# Patient Record
Sex: Female | Born: 1970 | Race: Black or African American | Hispanic: No | Marital: Single | Smoking: Current every day smoker
Health system: Southern US, Community
[De-identification: ages and names within clinical notes are randomized; demographics above are authoritative.]

## PROBLEM LIST (undated history)

## (undated) DIAGNOSIS — C539 Malignant neoplasm of cervix uteri, unspecified: Secondary | ICD-10-CM

## (undated) DIAGNOSIS — K649 Unspecified hemorrhoids: Secondary | ICD-10-CM

## (undated) DIAGNOSIS — F32A Depression, unspecified: Secondary | ICD-10-CM

## (undated) DIAGNOSIS — C801 Malignant (primary) neoplasm, unspecified: Secondary | ICD-10-CM

## (undated) DIAGNOSIS — T7840XA Allergy, unspecified, initial encounter: Secondary | ICD-10-CM

## (undated) DIAGNOSIS — F329 Major depressive disorder, single episode, unspecified: Secondary | ICD-10-CM

## (undated) HISTORY — DX: Malignant neoplasm of cervix uteri, unspecified: C53.9

## (undated) HISTORY — DX: Depression, unspecified: F32.A

## (undated) HISTORY — DX: Allergy, unspecified, initial encounter: T78.40XA

## (undated) HISTORY — PX: TUBAL LIGATION: SHX77

## (undated) HISTORY — DX: Major depressive disorder, single episode, unspecified: F32.9

## (undated) HISTORY — PX: BREAST SURGERY: SHX581

## (undated) HISTORY — PX: WISDOM TOOTH EXTRACTION: SHX21

## (undated) HISTORY — PX: CERVICAL CONE BIOPSY: SUR198

---

## 2000-05-21 ENCOUNTER — Emergency Department (HOSPITAL_COMMUNITY): Admission: EM | Admit: 2000-05-21 | Discharge: 2000-05-21 | Payer: Self-pay | Admitting: Emergency Medicine

## 2001-04-13 ENCOUNTER — Emergency Department (HOSPITAL_COMMUNITY): Admission: EM | Admit: 2001-04-13 | Discharge: 2001-04-13 | Payer: Self-pay

## 2002-05-04 ENCOUNTER — Emergency Department (HOSPITAL_COMMUNITY): Admission: EM | Admit: 2002-05-04 | Discharge: 2002-05-05 | Payer: Self-pay | Admitting: Emergency Medicine

## 2002-05-05 ENCOUNTER — Encounter: Payer: Self-pay | Admitting: Emergency Medicine

## 2003-06-22 ENCOUNTER — Emergency Department (HOSPITAL_COMMUNITY): Admission: EM | Admit: 2003-06-22 | Discharge: 2003-06-22 | Payer: Self-pay | Admitting: Emergency Medicine

## 2003-10-06 ENCOUNTER — Emergency Department (HOSPITAL_COMMUNITY): Admission: EM | Admit: 2003-10-06 | Discharge: 2003-10-07 | Payer: Self-pay | Admitting: Emergency Medicine

## 2004-01-22 ENCOUNTER — Emergency Department (HOSPITAL_COMMUNITY): Admission: EM | Admit: 2004-01-22 | Discharge: 2004-01-22 | Payer: Self-pay | Admitting: Emergency Medicine

## 2004-03-11 ENCOUNTER — Emergency Department (HOSPITAL_COMMUNITY): Admission: EM | Admit: 2004-03-11 | Discharge: 2004-03-11 | Payer: Self-pay | Admitting: Emergency Medicine

## 2004-08-20 ENCOUNTER — Emergency Department (HOSPITAL_COMMUNITY): Admission: EM | Admit: 2004-08-20 | Discharge: 2004-08-20 | Payer: Self-pay | Admitting: Emergency Medicine

## 2005-06-26 ENCOUNTER — Emergency Department (HOSPITAL_COMMUNITY): Admission: EM | Admit: 2005-06-26 | Discharge: 2005-06-26 | Payer: Self-pay | Admitting: Emergency Medicine

## 2005-10-25 ENCOUNTER — Emergency Department (HOSPITAL_COMMUNITY): Admission: EM | Admit: 2005-10-25 | Discharge: 2005-10-25 | Payer: Self-pay | Admitting: Emergency Medicine

## 2005-10-30 ENCOUNTER — Ambulatory Visit (HOSPITAL_COMMUNITY): Admission: RE | Admit: 2005-10-30 | Discharge: 2005-10-30 | Payer: Self-pay | Admitting: Preventative Medicine

## 2006-02-20 ENCOUNTER — Emergency Department (HOSPITAL_COMMUNITY): Admission: EM | Admit: 2006-02-20 | Discharge: 2006-02-20 | Payer: Self-pay | Admitting: Emergency Medicine

## 2006-06-29 ENCOUNTER — Emergency Department (HOSPITAL_COMMUNITY): Admission: EM | Admit: 2006-06-29 | Discharge: 2006-06-29 | Payer: Self-pay | Admitting: Emergency Medicine

## 2006-12-11 ENCOUNTER — Emergency Department (HOSPITAL_COMMUNITY): Admission: EM | Admit: 2006-12-11 | Discharge: 2006-12-11 | Payer: Self-pay | Admitting: Emergency Medicine

## 2006-12-28 ENCOUNTER — Emergency Department (HOSPITAL_COMMUNITY): Admission: EM | Admit: 2006-12-28 | Discharge: 2006-12-29 | Payer: Self-pay | Admitting: Emergency Medicine

## 2007-01-21 ENCOUNTER — Emergency Department (HOSPITAL_COMMUNITY): Admission: EM | Admit: 2007-01-21 | Discharge: 2007-01-21 | Payer: Self-pay | Admitting: Emergency Medicine

## 2007-03-16 ENCOUNTER — Inpatient Hospital Stay (HOSPITAL_COMMUNITY): Admission: EM | Admit: 2007-03-16 | Discharge: 2007-03-18 | Payer: Self-pay | Admitting: Emergency Medicine

## 2007-03-18 ENCOUNTER — Ambulatory Visit: Payer: Self-pay | Admitting: *Deleted

## 2007-03-18 ENCOUNTER — Inpatient Hospital Stay (HOSPITAL_COMMUNITY): Admission: AD | Admit: 2007-03-18 | Discharge: 2007-03-20 | Payer: Self-pay | Admitting: *Deleted

## 2007-05-22 ENCOUNTER — Other Ambulatory Visit: Admission: RE | Admit: 2007-05-22 | Discharge: 2007-05-22 | Payer: Self-pay | Admitting: Obstetrics and Gynecology

## 2008-09-09 ENCOUNTER — Emergency Department (HOSPITAL_COMMUNITY): Admission: EM | Admit: 2008-09-09 | Discharge: 2008-09-09 | Payer: Self-pay | Admitting: Emergency Medicine

## 2009-04-16 ENCOUNTER — Emergency Department (HOSPITAL_COMMUNITY): Admission: EM | Admit: 2009-04-16 | Discharge: 2009-04-16 | Payer: Self-pay | Admitting: Emergency Medicine

## 2010-08-07 ENCOUNTER — Emergency Department (HOSPITAL_COMMUNITY)
Admission: EM | Admit: 2010-08-07 | Discharge: 2010-08-07 | Payer: Self-pay | Source: Home / Self Care | Admitting: Emergency Medicine

## 2010-12-12 NOTE — H&P (Signed)
NAME:  Cromie, Yuliza         ACCOUNT NO.:  [id number]   MEDICAL RECORD NO.:  [id number]          PATIENT TYPE:  INP   LOCATION:  A216                          FACILITY:  [facility]   PHYSICIAN:  Mila Homer. Sudie Bailey, M.D.DATE OF BIRTH:  Dec 03, 1970   DATE OF ADMISSION:  03/16/2007  DATE OF DISCHARGE:  LH                              HISTORY & PHYSICAL   This 40 year old presented to the emergency room last night in torpor.   Currently she is treated at the [facility]  Department for bipolar disease.  Also, she has a history of asthma and  cigarette smoking.   The patient was asleep when I visited her today.  Family members there  included her mother, daughter, and her significant other.   CURRENT MEDICATIONS:  1. Clonazepam 0.5 mg as needed.  2. Trazodone 50 mg at bedtime.  3. Septra 960 mg b.i.d.  4. Seroquel 50 mg nightly.   According to the family, she had taken an overdose of Seroquel, as many  as 15 of these.  She has never taken an overdose before, again according  to them.   ADMISSION PHYSICAL EXAMINATION:  GENERAL:  Showed a sleeping young  woman.  VITAL SIGNS:  Temperature is 97 degrees, pulse 76, respiratory rate 20,  blood pressure 109/63.  I asked  her questions and talked to her, and  she was really unarousable.  O2 saturation was 100% on 2 liters.  LUNGS:  Clear throughout.  She is moving air well. Respiratory rate was  good at 20.  HEART:  Regular rhythm without murmur, rate of 80.  ADENOPATHY:  There was no axillary or supraclavicular adenopathy.  ABDOMEN:  Soft without hepatosplenomegaly or mass.  EXTREMITIES:  There was no edema in the ankles.   Blood tests she had included urine HCG which was negative.  Urine drug  screen was negative.  Alcohol level 160.  Acetaminophen level less than  10.  Hemoglobin 6.8, hematocrit 27.9,  MCV 66.5, platelet count 899,000,  white cell count 4900 with 2% metamyelocytes sites, 60 neutrophils, 28  lymphs.  BMET showed glucose 115, BUN 4, creatinine 0.68.  Salicylate  level less than 4.0, and acetaminophen level less than 10.  Reticulocyte  count was 1.6. The rest of the anemia workup is pending.   ASSESSMENT:  1. Drug overdose, probably with Seroquel  2. Bipolar disease.  3. Anemia, probably iron deficient anemia  4. Acute alcoholism.   I have discussed with family at length.  Will put in an IV of normal  saline and type and cross for 2 units of packed cells; give 1 unit  packed cells.  Once she has woken up and her hemoglobin is stable, we  will have her transferred to [facility] for further workup.  The ACT  team will be consulted.      Mila Homer. Sudie Bailey, M.D.  Electronically Signed     SDK/MEDQ  D:  03/16/2007  T:  03/16/2007  Job:  [id number]

## 2010-12-12 NOTE — Discharge Summary (Signed)
NAME:  Marissa Frank, Marissa Frank         ACCOUNT NO.:  [id number]   MEDICAL RECORD NO.:  [id number]          PATIENT TYPE:  INP   LOCATION:  A216                          FACILITY:  [facility]   PHYSICIAN:  Edward L. Juanetta Gosling, M.D.DATE OF BIRTH:  1971-05-31   DATE OF ADMISSION:  03/16/2007  DATE OF DISCHARGE:  08/19/2008LH                               DISCHARGE SUMMARY   FINAL DISCHARGE DIAGNOSES:  1. Suicide attempt.  2. Depression.  3. Asthma.  4. Bipolar disease.  5. Anemia.  6. Acute alcohol abuse.   HISTORY:  [name] is a 40 year old who came to the emergency room  because she was poorly responsive.  Her family said that she had taken  an overdose of Seroquel, as many as 15.  These were 50-mg Seroquel.  She  has been on Seroquel 50 mg at bedtime, Klonopin 0.5 as needed, trazodone  50 mg at bedtime as directed by the [facility].  Exam on admission showed that she was sleepy, poorly arousable.  O2 saturation was 100% on 2 L.  Her lungs were clear.  Respiratory rate  was about 20.  Heart regular.  Her abdomen was soft.  Her extremities  showed no edema.  Urine pregnancy test was negative.  Drug screen  negative.  Alcohol 160.  Acetaminophen level less than 10.  Hemoglobin  was 6.82. BMET was normal.   HOSPITAL COURSE:  She was felt to have a drug overdose with Seroquel,  bipolar disease, anemia, acute alcohol abuse.  She was given 2 units of  packed red blood cells, had no stools positive for blood.  She was  stabilized here and after her hemoglobin was better she was she had ACT  Team consultation and then was transferred to psychiatry service for  further workup.  Her anemia can be worked up as an outpatient.  She is  going to make an appointment to my office when she gets out of the  hospital and I will set her up for workup for her anemia.      Edward L. Juanetta Gosling, M.D.  Electronically Signed     ELH/MEDQ  D:  03/18/2007  T:  03/19/2007  Job:   [id number]

## 2010-12-12 NOTE — Group Therapy Note (Signed)
NAME:  Collignon, Cady         ACCOUNT NO.:  [id number]   MEDICAL RECORD NO.:  [id number]          PATIENT TYPE:  INP   LOCATION:  A216                          FACILITY:  [facility]   PHYSICIAN:  Edward L. Juanetta Gosling, M.D.DATE OF BIRTH:  05-Dec-1970   DATE OF PROCEDURE:  DATE OF DISCHARGE:                                 PROGRESS NOTE   Ms. Blankenburg better.  She says that she feels okay and she wants to  go home.  I have explained to her that she is going to need some further  psychiatric evaluation and treatment before she goes home since she did  have a suicide attempt.   EXAM:  She is awake and alert.  Temperature is 97.1, pulse 62, respirations 20, blood pressure 105/67,  O2 sat 97% on room air.   Her hemoglobin level is greater than 10.  I think she has iron  deficiency anemia.  She has not had any stools yet but I do not really  have any evidence of any sort of active bleeding now and this can be  workup as an outpatient.  She is currently cleared for transfer to  psychiatric facility, but I have explained to her that she is going to  have to come back to my office and get set up for a workup of her iron  deficiency anemia.  She could have menstrual blood loss or GI blood  loss.  We do not have a definite source.      Edward L. Juanetta Gosling, M.D.  Electronically Signed     ELH/MEDQ  D:  03/18/2007  T:  03/18/2007  Job:  [id number]

## 2010-12-12 NOTE — Group Therapy Note (Signed)
NAME:  Renner, Jacie         ACCOUNT NO.:  [id number]   MEDICAL RECORD NO.:  [id number]          PATIENT TYPE:  INP   LOCATION:  A216                          FACILITY:  [facility]   PHYSICIAN:  Edward L. Juanetta Gosling, M.D.DATE OF BIRTH:  Jul 04, 1971   DATE OF PROCEDURE:  03/17/2007  DATE OF DISCHARGE:                                 PROGRESS NOTE   Ms. Abrell was admitted with a drug overdose and was found to be  markedly anemic.  This morning she says her vision is a little blurred  and she feels like she is having some trouble with asthma.  She is  otherwise doing fairly well.  She is treated at the [facility] for bipolar disease.  Her anemia appears to be iron-  deficiency anemia, although final results are pending.   PHYSICAL EXAM:  Shows that she appears to be fairly comfortable.  CHEST:  Relatively clear despite her complaints of needing a nebulizer  treatment.  She has no other new complaints.  Her iron studies do show that her iron  is less than 10, so she is clearly iron deficient.  We will go ahead and  check stools for blood.  Her hemoglobin this morning is 7.8 so she needs  some more blood.  Will get the ACT team involved and depending on their  recommendation I may try to get her worked up for her anemia as an  outpatient.      Edward L. Juanetta Gosling, M.D.  Electronically Signed     ELH/MEDQ  D:  03/17/2007  T:  03/17/2007  Job:  [id number]

## 2010-12-12 NOTE — Discharge Summary (Signed)
NAME:  Yonker, VELEKA DJORDJEVIC NO.:  [id number]   MEDICAL RECORD NO.:  [id number]          PATIENT TYPE:  IPS   LOCATION:  0600                          FACILITY:  [facility]   PHYSICIAN:  Jasmine Pang, M.D. DATE OF BIRTH:  1970/08/06   DATE OF ADMISSION:  03/18/2007  DATE OF DISCHARGE:  03/20/2007                               DISCHARGE SUMMARY   IDENTIFYING INFORMATION:  This 40 year old African-American female who  was admitted on a voluntary basis on March 18, 2007.   HISTORY OF PRESENT ILLNESS:  The patient was transferred from the [facility].  She is medically stable.  She initially had an overdose  on Seroquel and drank beer.  She states this was a suicide gesture.  She was trying to get attention because her family was having conflict  and would not listen to her.  She told her daughter to call 9-1-1 as  soon as she took the overdose.  She stated she wanted her family to  listen to me.  On the day of the overdose, she also drank a 40-ounce  beer.   PAST PSYCHIATRIC HISTORY:  This is the first [facility] admission for this  patient.  She is seen at the [facility] in [place].  She  has no history of previous admissions.  She has been on Wellbutrin in  the past and Paxil.   ALCOHOL/DRUG HISTORY:  She smokes but denies drug abuse.  She does use  alcohol periodically.   MEDICAL HISTORY:  She has anemia.  There is no history of sepsis.   MEDICATIONS:  She is currently on Cymbalta for about the past year,  Seroquel and Klonopin.   ALLERGIES:  She is allergic TOPAMAX (causes ataxia).   PHYSICAL EXAMINATION:  Physical exam was done at the [facility] ED.  The  patient was found to be healthy with no acute physical or medical  problems.   DIAGNOSTIC STUDIES:  Glucose was 115.  Other basic metabolic panel was  normal.  UDS was negative.  Urine pregnancy test was negative.  Hemoglobin was 6.8 and hematocrit 22.5, the MCV was 66.5.  Differential  was  within normal limits.  The patient sees Dr. Kari Baars for her  anemia in [place].  Alcohol level was 160.  Acetaminophen level less  than 10.  The rest of the labs were done at the [facility] ED prior to  admission and evaluated by the ED physician.   HOSPITAL COURSE:  Upon admission, the patient was started on her DuoNeb  q.6h. p.r.n. shortness of breath and albuterol inhaler 2 puffs q.4-6h.  p.r.n.  She was also started on Ambien 5 mg p.o. q.h.s. p.r.n. insomnia,  may repeat x1 if needed.  She was started on trazodone 50 mg p.o. q.h.s.  and Septra DS 1 p.o. b.i.d. x3 days.  On March 19, 2007, the patient  was restarted on Cymbalta but it was increased to 90 mg p.o. q.d. from  60 mg p.o. q.d.  The patient tolerated her medications well with no  significant side effects.  The patient was friendly and cooperative.  She  stated she had just wanted attention because of the family  conflict, especially with her boyfriend.  She denied suicidal ideation.  She states she has support from her family.  She was able to participate  appropriately in unit therapeutic groups and activities.  She found that  her daughter was going to be moving out of the home which she states is  one of the main stressors for her.  Her mood and affect improved as  hospitalization progressed.  On March 20, 2007, mental status had  improved markedly from admission status.  She was friendly and  cooperative with good eye contact.  Speech was normal rate and flow.  Psychomotor activity was within normal limits.  Mood euthymic.  Affect  wide range.  There was no suicidal or homicidal ideation.  No thoughts  of self-injurious behavior.  No auditory or visual hallucinations.  No  paranoia or delusions.  Thoughts were logical and goal-directed.  Thought content no predominant theme.  Cognitive was back to baseline  and it was felt the patient was safe to be discharged today.  She will  be taken home by either her  boyfriend or her cousin.   DISCHARGE DIAGNOSES:  AXIS I:  Major depression, recurrent, severe  without psychosis.  Rule out alcohol use.  AXIS II:  None.  AXIS III:  Anemia, being monitored by her primary care physician.  AXIS IV:  Moderate (problems with primary support group, problems  related to social environment, burden of psychiatric illness, burden of  medical problems).  AXIS V:  GAF on discharge 50; GAF on admission 40; GAF highest past year  65.   ACTIVITY/DIET:  There were no specific activity level or dietary  restrictions.   POST-HOSPITAL CARE PLANS:  The patient will follow up with Dr. Juanetta Gosling  for anemia.  Therapist and psychiatrist will be arranged for her by our  casemanager.  This is in process now.      Jasmine Pang, M.D.  Electronically Signed     BHS/MEDQ  D:  03/20/2007  T:  03/20/2007  Job:  [id number]

## 2011-03-30 ENCOUNTER — Encounter: Payer: Self-pay | Admitting: *Deleted

## 2011-03-30 ENCOUNTER — Emergency Department (HOSPITAL_COMMUNITY)
Admission: EM | Admit: 2011-03-30 | Discharge: 2011-03-30 | Disposition: A | Discharge status: Home / Self Care | Payer: Self-pay | Attending: Emergency Medicine | Admitting: Emergency Medicine

## 2011-03-30 DIAGNOSIS — J45909 Unspecified asthma, uncomplicated: Secondary | ICD-10-CM | POA: Insufficient documentation

## 2011-03-30 DIAGNOSIS — H539 Unspecified visual disturbance: Secondary | ICD-10-CM | POA: Insufficient documentation

## 2011-03-30 DIAGNOSIS — H5710 Ocular pain, unspecified eye: Secondary | ICD-10-CM | POA: Insufficient documentation

## 2011-03-30 DIAGNOSIS — H53149 Visual discomfort, unspecified: Secondary | ICD-10-CM | POA: Insufficient documentation

## 2011-03-30 DIAGNOSIS — H5789 Other specified disorders of eye and adnexa: Secondary | ICD-10-CM | POA: Insufficient documentation

## 2011-03-30 DIAGNOSIS — F17200 Nicotine dependence, unspecified, uncomplicated: Secondary | ICD-10-CM | POA: Insufficient documentation

## 2011-03-30 DIAGNOSIS — Z859 Personal history of malignant neoplasm, unspecified: Secondary | ICD-10-CM | POA: Insufficient documentation

## 2011-03-30 HISTORY — DX: Malignant (primary) neoplasm, unspecified: C80.1

## 2011-03-30 MED ORDER — KETOROLAC TROMETHAMINE 0.5 % OP SOLN: 1.0000 [drp] | Freq: Four times a day (QID) | OPHTHALMIC | Status: DC

## 2011-03-30 MED ORDER — TETRACAINE HCL 0.5 % OP SOLN
2.0000 [drp] | Freq: Once | OPHTHALMIC | Status: AC
  Administered 2011-03-30: 2 [drp] via OPHTHALMIC
  Filled 2011-03-30: qty 2

## 2011-03-30 MED ORDER — HYDROCODONE-ACETAMINOPHEN 5-325 MG PO TABS: ORAL_TABLET | ORAL | Status: DC

## 2011-03-30 MED ORDER — KETOROLAC TROMETHAMINE 0.5 % OP SOLN: 1.0000 [drp] | Freq: Once | OPHTHALMIC | Status: DC

## 2011-03-30 MED ORDER — FLUORESCEIN SODIUM 1 MG OP STRP: 1.0000 | ORAL_STRIP | Freq: Once | OPHTHALMIC | Status: DC

## 2011-03-30 MED ORDER — HYDROCODONE-ACETAMINOPHEN 5-325 MG PO TABS: 1.0000 | ORAL_TABLET | Freq: Once | ORAL | Status: DC

## 2011-03-30 NOTE — ED Provider Notes (Signed)
History     CSN: [id number] Arrival date & time: 03/30/2011  8:46 PM  Chief Complaint  Patient presents with  . Eye Pain   HPI Comments: Pt' husband drives long-distance.  She has been with him for the past week.  She states it feels like there is something in her eye but doesn't recall a particular time when it got there.  Using visine with no relief.  Patient is a 40 y.o. female presenting with eye pain. The history is provided by the patient. No language interpreter was used.  Eye Pain This is a new problem. The current episode started today (6 days ago). The problem occurs constantly. The problem has been unchanged. Exacerbated by: bright lite and eye movement. She has tried nothing for the symptoms. The treatment provided no relief.    Past Medical History  Diagnosis Date  . Asthma   . Cancer     Past Surgical History  Procedure Date  . Breast surgery     History reviewed. No pertinent family history.  History  Substance Use Topics  . Smoking status: Current Everyday Smoker -- 0.5 packs/day  . Smokeless tobacco: Not on file  . Alcohol Use: Yes    OB History    Grav Para Term Preterm Abortions TAB SAB Ect Mult Living                  Review of Systems  Eyes: Positive for photophobia, pain, redness and visual disturbance.  All other systems reviewed and are negative.    Physical Exam  BP 113/66  Pulse 59  Temp(Src) 98.1 F (36.7 C) (Oral)  Resp 20  Ht 5\' 2"  (1.575 m)  Wt 100 lb (45.36 kg)  BMI 18.29 kg/m2  SpO2 100%  Physical Exam  Nursing note and vitals reviewed. Constitutional: She is oriented to person, place, and time. Vital signs are normal. She appears well-developed and well-nourished. No distress.  HENT:  Head: Normocephalic and atraumatic.  Right Ear: External ear normal.  Left Ear: External ear normal.  Nose: Nose normal.  Mouth/Throat: No oropharyngeal exudate.  Eyes: Conjunctivae and EOM are normal. Pupils are equal, round, and  reactive to light. Right eye exhibits no discharge. Left eye exhibits no discharge. No scleral icterus.         Upper lid inverted.  No FB visualized.  anesth with tetracaine and stained with fluorosceine.  No sign of corneal abrasion.  Will irrigate with NaCl via morgan lens.  Neck: Normal range of motion. Neck supple. No JVD present. No tracheal deviation present. No thyromegaly present.  Cardiovascular: Normal rate, regular rhythm, normal heart sounds, intact distal pulses and normal pulses.  Exam reveals no gallop and no friction rub.   No murmur heard. Pulmonary/Chest: Effort normal and breath sounds normal. No stridor. No respiratory distress. She has no wheezes. She has no rales. She exhibits no tenderness.  Abdominal: Soft. Normal appearance and bowel sounds are normal. She exhibits no distension and no mass. There is no tenderness. There is no rebound and no guarding.  Musculoskeletal: Normal range of motion. She exhibits no edema and no tenderness.  Lymphadenopathy:    She has no cervical adenopathy.  Neurological: She is alert and oriented to person, place, and time. She has normal reflexes. No cranial nerve deficit. Coordination normal. GCS eye subscore is 4. GCS verbal subscore is 5. GCS motor subscore is 6.  Skin: Skin is warm and dry. No rash noted. She is not diaphoretic.  Psychiatric: She has a normal mood and affect. Her speech is normal and behavior is normal. Judgment and thought content normal. Cognition and memory are normal.    ED Course  Procedures  MDM       Worthy Rancher, PA 03/30/11 2247

## 2011-03-30 NOTE — ED Notes (Signed)
Pain in right eye, sclera is red and painful x 6 days

## 2011-03-31 NOTE — ED Provider Notes (Signed)
Medical screening examination/treatment/procedure(s) were performed by non-physician practitioner and as supervising physician I was immediately available for consultation/collaboration.  Glynn Octave, MD 03/31/11 [phone]

## 2011-05-11 LAB — CROSSMATCH

## 2011-05-11 LAB — CBC
HCT: 25.1 — ABNORMAL LOW
Hemoglobin: 7.8 — CL
MCHC: 29.7 — ABNORMAL LOW
MCV: 70.8 — ABNORMAL LOW
Platelets: 509 — ABNORMAL HIGH
Platelets: 697 — ABNORMAL HIGH
RBC: 3.44 — ABNORMAL LOW
RDW: 25 — ABNORMAL HIGH
WBC: 11.7 — ABNORMAL HIGH
WBC: 9

## 2011-05-11 LAB — DIFFERENTIAL
Basophils Relative: 0
Eosinophils Relative: 2
Lymphocytes Relative: 13
Monocytes Relative: 13 — ABNORMAL HIGH
Myelocytes: 0
Neutro Abs: 2.9
Neutrophils Relative %: 60
Promyelocytes Absolute: 0
nRBC: 0

## 2011-05-11 LAB — HEPATIC FUNCTION PANEL
Alkaline Phosphatase: 71
Bilirubin, Direct: 0.1
Indirect Bilirubin: 0.6
Total Bilirubin: 0.7

## 2011-05-11 LAB — BASIC METABOLIC PANEL
CO2: 24
Calcium: 9.2
Creatinine, Ser: 0.68
GFR calc Af Amer: >60
Glucose, Bld: 115 — ABNORMAL HIGH

## 2011-05-11 LAB — PREGNANCY, URINE: Preg Test, Ur: NEGATIVE

## 2011-05-11 LAB — IRON AND TIBC
Iron: <10 — ABNORMAL LOW
UIBC: 415

## 2011-05-11 LAB — FERRITIN: Ferritin: 2 — ABNORMAL LOW (ref 10–291)

## 2011-05-11 LAB — RAPID URINE DRUG SCREEN, HOSP PERFORMED
Benzodiazepines: NOT DETECTED
Cocaine: NOT DETECTED
Tetrahydrocannabinol: NOT DETECTED

## 2011-05-11 LAB — HEMOGLOBIN AND HEMATOCRIT, BLOOD: Hemoglobin: 10.1 — ABNORMAL LOW

## 2011-05-11 LAB — RETICULOCYTES: Retic Count, Absolute: 55.2

## 2012-05-05 ENCOUNTER — Emergency Department (HOSPITAL_COMMUNITY): Payer: Self-pay

## 2012-05-05 ENCOUNTER — Encounter (HOSPITAL_COMMUNITY): Payer: Self-pay | Admitting: *Deleted

## 2012-05-05 ENCOUNTER — Emergency Department (HOSPITAL_COMMUNITY)
Admission: EM | Admit: 2012-05-05 | Discharge: 2012-05-06 | Disposition: A | Discharge status: Home / Self Care | Payer: Self-pay | Attending: Emergency Medicine | Admitting: Emergency Medicine

## 2012-05-05 DIAGNOSIS — M25519 Pain in unspecified shoulder: Secondary | ICD-10-CM | POA: Insufficient documentation

## 2012-05-05 DIAGNOSIS — F17200 Nicotine dependence, unspecified, uncomplicated: Secondary | ICD-10-CM | POA: Insufficient documentation

## 2012-05-05 DIAGNOSIS — M79603 Pain in arm, unspecified: Secondary | ICD-10-CM

## 2012-05-05 DIAGNOSIS — M79609 Pain in unspecified limb: Secondary | ICD-10-CM | POA: Insufficient documentation

## 2012-05-05 MED ORDER — HYDROCODONE-ACETAMINOPHEN 5-325 MG PO TABS
2.0000 | ORAL_TABLET | Freq: Once | ORAL | Status: AC
  Administered 2012-05-06: 2 via ORAL
  Filled 2012-05-05: qty 2

## 2012-05-05 MED ORDER — KETOROLAC TROMETHAMINE 60 MG/2ML IM SOLN
60.0000 mg | Freq: Once | INTRAMUSCULAR | Status: AC
  Administered 2012-05-05: 60 mg via INTRAMUSCULAR
  Filled 2012-05-05: qty 2

## 2012-05-05 NOTE — ED Notes (Signed)
Patient states that she has been having pain in her left upper arm, left neck and left shoulder.  States that the pain radiates up and down her left upper arm, also states that this pain has been going on for approx 1 month.  States that she has been taking aspirin at home without relief.

## 2012-05-05 NOTE — ED Provider Notes (Signed)
History   Scribed for [employer]. Colon Branch, MD, the patient was seen in room APA04/APA04 . This chart was scribed by Lewanda Rife.   CSN: [id number]  Arrival date & time 05/05/12  2241   First MD Initiated Contact with Patient 05/05/12 2309      Chief Complaint  Patient presents with  . Back Pain    (Consider location/radiation/quality/duration/timing/severity/associated sxs/prior treatment) HPI Marissa Frank is a 41 y.o. female who presents to the Emergency Department complaining of constant severe left shoulder pain with radiation to left neck, and left arm for the past month. Pt reports pain increased pain with movement. Pt denies recent injury. Pt reports taking aspirin and ibuprofen for pain with mild relief.  Past Medical History  Diagnosis Date  . Asthma   . Cancer     Past Surgical History  Procedure Date  . Breast surgery   . Tubal ligation     History reviewed. No pertinent family history.  History  Substance Use Topics  . Smoking status: Current Every Day Smoker -- 0.5 packs/day  . Smokeless tobacco: Not on file  . Alcohol Use: Yes    OB History    Grav Para Term Preterm Abortions TAB SAB Ect Mult Living                  Review of Systems  Constitutional: Negative.   HENT: Negative.   Respiratory: Negative.   Cardiovascular: Negative.   Gastrointestinal: Negative.   Musculoskeletal: Positive for back pain.       Left should pain  Skin: Negative.   Neurological: Negative.   Hematological: Negative.   Psychiatric/Behavioral: Negative.   All other systems reviewed and are negative.    Allergies  Almond oil; Coconut flavor; and Macadamia nut oil  Home Medications   Current Outpatient Rx  Name Route Sig Dispense Refill  . REFRESH P.M. OP OINT Right Eye Place 1 application into the right eye as needed.      Marland Kitchen FERROUS SULFATE 325 (65 FE) MG PO TABS Oral Take 325 mg by mouth daily.      Marland Kitchen HYDROCODONE-ACETAMINOPHEN 5-325 MG PO TABS   One po Q 4-6 hrs prn pain 20 tablet 0  . IBUPROFEN 200 MG PO TABS Oral Take 400 mg by mouth once as needed. For pan     . OXYMETAZOLINE HCL 0.05 % NA SOLN Nasal Place 2 sprays into the nose 2 (two) times daily.      Jeananne Rama SULFATE 0.05-0.25 % OP SOLN  2 drops 3 (three) times daily as needed. FOR PAIN       BP 120/72  Pulse 67  Temp 98 F (36.7 C) (Oral)  Resp 20  Ht 5\' 2"  (1.575 m)  Wt 87 lb (39.463 kg)  BMI 15.91 kg/m2  SpO2 100%  LMP 04/19/2012  Physical Exam  Nursing note and vitals reviewed. Constitutional: She is oriented to person, place, and time. She appears well-developed and well-nourished.       Smelled strongly of ETOH   HENT:  Head: Normocephalic and atraumatic.  Eyes: EOM are normal. Pupils are equal, round, and reactive to light.  Neck: Normal range of motion. Neck supple.  Cardiovascular: Normal rate, normal heart sounds and intact distal pulses.   Pulmonary/Chest: Effort normal and breath sounds normal.  Abdominal: Bowel sounds are normal. She exhibits no distension. There is no tenderness.  Musculoskeletal: Normal range of motion. She exhibits tenderness (left sided neck pain on exam with passive  range of motion of left arm ). She exhibits no edema.       No obvious deformity on left arm No focal area of tenderness other than the left sternocleidomastoid Pain was reproducible with passive motion of left arm on exam  Neurological: She is alert and oriented to person, place, and time. She has normal strength. No cranial nerve deficit or sensory deficit.  Skin: Skin is warm and dry. No rash noted.  Psychiatric: She has a normal mood and affect.    ED Course  Procedures (including critical care time)  Dg Shoulder Left  05/06/2012  *RADIOLOGY REPORT*  Clinical Data: 41 year old female with pain.  LEFT SHOULDER - 2+ VIEW  Comparison: None.  Findings: No glenohumeral joint dislocation.   Bone mineralization is within normal limits.  Proximal left  humerus, left clavicle, and left scapula intact.  Visualized left ribs and lung parenchyma within normal limits.  IMPRESSION: No acute osseous abnormality identified about the left shoulder.   Original Report Authenticated By: Harley Hallmark, M.D.     MDM  Patient with left shoulder and arm pain x 1 month with no known injury. Xrays are normal. Given  An antiinflammatory with improvement. Reviewed films with the patient. Referral to ortho. Pt feels improved after observation and/or treatment in ED.Pt stable in ED with no significant deterioration in condition.The patient appears reasonably screened and/or stabilized for discharge and I doubt any other medical condition or other [facility] requiring further screening, evaluation, or treatment in the ED at this time prior to discharge.  I personally performed the services described in this documentation, which was scribed in my presence. The recorded information has been reviewed and considered.   MDM Reviewed: nursing note and vitals Interpretation: x-ray           Nicoletta Dress. Colon Branch, MD 05/06/12 0100

## 2012-05-05 NOTE — ED Notes (Signed)
Pain lt arm, neck and lt shoulder, x 1 month.  No injury

## 2012-05-06 MED ORDER — HYDROCODONE-ACETAMINOPHEN 5-325 MG PO TABS: 1.0000 | ORAL_TABLET | ORAL | Status: AC | PRN

## 2014-06-01 ENCOUNTER — Other Ambulatory Visit (HOSPITAL_COMMUNITY): Payer: Self-pay | Admitting: *Deleted

## 2014-06-01 DIAGNOSIS — Z1231 Encounter for screening mammogram for malignant neoplasm of breast: Secondary | ICD-10-CM

## 2014-06-03 ENCOUNTER — Ambulatory Visit (HOSPITAL_COMMUNITY)
Admission: RE | Admit: 2014-06-03 | Discharge: 2014-06-03 | Disposition: A | Discharge status: Home / Self Care | Payer: PRIVATE HEALTH INSURANCE | Source: Ambulatory Visit | Attending: *Deleted | Admitting: *Deleted

## 2014-06-03 DIAGNOSIS — Z1231 Encounter for screening mammogram for malignant neoplasm of breast: Secondary | ICD-10-CM | POA: Insufficient documentation

## 2015-01-18 ENCOUNTER — Encounter (HOSPITAL_COMMUNITY): Payer: Self-pay

## 2015-01-18 ENCOUNTER — Emergency Department (HOSPITAL_COMMUNITY)
Admission: EM | Admit: 2015-01-18 | Discharge: 2015-01-18 | Disposition: A | Discharge status: Home / Self Care | Payer: Self-pay | Attending: Emergency Medicine | Admitting: Emergency Medicine

## 2015-01-18 DIAGNOSIS — K644 Residual hemorrhoidal skin tags: Secondary | ICD-10-CM | POA: Insufficient documentation

## 2015-01-18 DIAGNOSIS — Z859 Personal history of malignant neoplasm, unspecified: Secondary | ICD-10-CM | POA: Insufficient documentation

## 2015-01-18 DIAGNOSIS — Z720 Tobacco use: Secondary | ICD-10-CM | POA: Insufficient documentation

## 2015-01-18 DIAGNOSIS — J45909 Unspecified asthma, uncomplicated: Secondary | ICD-10-CM | POA: Insufficient documentation

## 2015-01-18 DIAGNOSIS — Z7982 Long term (current) use of aspirin: Secondary | ICD-10-CM | POA: Insufficient documentation

## 2015-01-18 DIAGNOSIS — Z79899 Other long term (current) drug therapy: Secondary | ICD-10-CM | POA: Insufficient documentation

## 2015-01-18 DIAGNOSIS — K602 Anal fissure, unspecified: Secondary | ICD-10-CM | POA: Insufficient documentation

## 2015-01-18 NOTE — ED Provider Notes (Signed)
CSN: [id number]     Arrival date & time 01/18/15  1696 History   First MD Initiated Contact with Patient 01/18/15 1026     Chief Complaint  Patient presents with  . Rectal Bleeding     HPI Pt was seen at 1100. Per pt, c/o sudden onset and resolution of one episode of "rectal bleeding" that occurred this morning PTA. Pt states she has been constipated and had a large, hard BM this morning. Pt states the rectal bleeding and pain began after she passed the stool. Denies bloody or black stool, no abd pain, no fevers.    Past Medical History  Diagnosis Date  . Asthma   . Cancer    Past Surgical History  Procedure Laterality Date  . Breast surgery    . Tubal ligation      History  Substance Use Topics  . Smoking status: Current Every Day Smoker -- 0.50 packs/day  . Smokeless tobacco: Not on file  . Alcohol Use: Yes     Comment: occ    Review of Systems ROS: Statement: All systems negative except as marked or noted in the HPI; Constitutional: Negative for fever and chills. ; ; Eyes: Negative for eye pain, redness and discharge. ; ; ENMT: Negative for ear pain, hoarseness, nasal congestion, sinus pressure and sore throat. ; ; Cardiovascular: Negative for chest pain, palpitations, diaphoresis, dyspnea and peripheral edema. ; ; Respiratory: Negative for cough, wheezing and stridor. ; ; Gastrointestinal: Negative for nausea, vomiting, diarrhea, abdominal pain, blood in stool, hematemesis, jaundice and +constipation, rectal pain, rectal bleeding. . ; ; Genitourinary: Negative for dysuria, flank pain and hematuria. ; ; Musculoskeletal: Negative for back pain and neck pain. Negative for swelling and trauma.; ; Skin: Negative for pruritus, rash, abrasions, blisters, bruising and skin lesion.; ; Neuro: Negative for headache, lightheadedness and neck stiffness. Negative for weakness, altered level of consciousness , altered mental status, extremity weakness, paresthesias, involuntary movement, seizure  and syncope.      Allergies  Almond oil; Coconut flavor; and Macadamia nut oil  Home Medications   Prior to Admission medications   Medication Sig Start Date End Date Taking? Authorizing Provider  aspirin 325 MG tablet Take 325 mg by mouth daily.   Yes Historical Provider, MD  Cyanocobalamin (VITAMIN B-12 PO) Take 1 tablet by mouth daily.   Yes Historical Provider, MD  Ephedrine-Guaifenesin (PRIMATENE ASTHMA PO) Take 1 tablet by mouth daily as needed (shortness of breath).   Yes Historical Provider, MD  ferrous sulfate 325 (65 FE) MG tablet Take 325 mg by mouth daily.     Yes Historical Provider, MD  ibuprofen (ADVIL,MOTRIN) 200 MG tablet Take 400 mg by mouth once as needed. For pan    Yes Historical Provider, MD  oxymetazoline (AFRIN) 0.05 % nasal spray Place 2 sprays into the nose 2 (two) times daily.     Yes Historical Provider, MD  tetrahydrozoline-zinc (VISINE-AC) 0.05-0.25 % ophthalmic solution 2 drops 3 (three) times daily as needed. FOR PAIN    Yes Historical Provider, MD  HYDROcodone-acetaminophen [facility]) 5-325 MG per tablet One po Q 4-6 hrs prn pain Patient not taking: Reported on 01/18/2015 03/30/11   Duaine Dredge, PA-C   BP 121/85 mmHg  Pulse 70  Temp(Src) 98.4 F (36.9 C) (Oral)  Resp 16  Ht 5\' 1"  (1.549 m)  Wt 91 lb (41.277 kg)  BMI 17.20 kg/m2  SpO2 100%  LMP 04/03/2014 Physical Exam  1105: Physical examination:  Nursing notes reviewed;  Vital signs and O2 SAT reviewed;  Constitutional: Well developed, Well nourished, Well hydrated, In no acute distress; Head:  Normocephalic, atraumatic; Eyes: EOMI, PERRL, No scleral icterus; ENMT: Mouth and pharynx normal, Mucous membranes moist; Neck: Supple, Full range of motion, No lymphadenopathy; Cardiovascular: Regular rate and rhythm, No murmur, rub, or gallop; Respiratory: Breath sounds clear & equal bilaterally, No rales, rhonchi, wheezes.  Speaking full sentences with ease, Normal respiratory effort/excursion; Chest:  Nontender, Movement normal; Abdomen: Soft, Nontender, Nondistended, Normal bowel sounds. Rectal exam performed w/permission of pt and ED RN chaperone present.  Anal tone normal. +tender anal fissure without active bleeding. +external hemorrhoid without thrombosis or bleeding..; Genitourinary: No CVA tenderness; Extremities: Pulses normal, No tenderness, No edema, No calf edema or asymmetry.; Neuro: AA&Ox3, Major CN grossly intact.  Speech clear. No gross focal motor or sensory deficits in extremities.; Skin: Color normal, Warm, Dry.   ED Course  Procedures     EKG Interpretation None      MDM  MDM Reviewed: previous chart, nursing note and vitals      1135:  +rectal fissure and external hemorrhoid on exam. No active rectal bleeding. Dx d/w pt.  Questions answered.  Verb understanding, agreeable to d/c home with outpt f/u.   Francine Graven, DO 01/21/15 [phone]

## 2015-01-18 NOTE — Discharge Instructions (Signed)
°Emergency Department Resource Guide °1) Find a Doctor and Pay Out of Pocket °Although you won't have to find out who is covered by your insurance plan, it is a good idea to ask around and get recommendations. You will then need to call the office and see if the doctor you have chosen will accept you as a new patient and what types of options they offer for patients who are self-pay. Some doctors offer discounts or will set up payment plans for their patients who do not have insurance, but you will need to ask so you aren't surprised when you get to your appointment. ° °2) Contact Your Local Health Department °Not all health departments have doctors that can see patients for sick visits, but many do, so it is worth a call to see if yours does. If you don't know where your local health department is, you can check in your phone book. The CDC also has a tool to help you locate your state's health department, and many state websites also have listings of all of their local health departments. ° °3) Find a Walk-in Clinic °If your illness is not likely to be very severe or complicated, you may want to try a walk in clinic. These are popping up all over the country in pharmacies, drugstores, and shopping centers. They're usually staffed by nurse practitioners or physician assistants that have been trained to treat common illnesses and complaints. They're usually fairly quick and inexpensive. However, if you have serious medical issues or chronic medical problems, these are probably not your best option. ° °No Primary Care Doctor: °- Call [facility] at  832-8000 - they can help you locate a primary care doctor that  accepts your insurance, provides certain services, etc. °- Physician Referral Service- 1-800-533-3463 ° °Chronic Pain Problems: °Organization         Address  Phone   Notes  °[facility]  (336) 297-2271 Patients need to be referred by their primary care doctor.  ° °Medication  Assistance: °Organization         Address  Phone   Notes  °[place] [facility] [address] °[place], [place] [address] (336) 641-8030 --Must be a resident of [place] °-- Must have NO insurance coverage whatsoever (no Medicaid/ Medicare, etc.) °-- The pt. MUST have a primary care doctor that directs their care regularly and follows them in the community °  °[facility]  (866) 331-1348   °[employer]  (888) 892-1162   ° °Agencies that provide inexpensive medical care: °Organization         Address  Phone   Notes  °[facility]  (336) 832-8035   °[facility]    (336) 832-7272   °[facility] [address] °[place],  [address] (336) 832-4777   °[facility] [address], °[place] (336) 271-4999   °[facility]    (336) 373-0678   °[facility]    (336) 272-1050   °[facility] ° [address], [place] Phone:  (336) 832-4444, Fax:  (336) 832-4440 Hours of Operation:  9 am - 6 pm, M-F.  Also accepts Medicaid/Medicare and self-pay.  °[facility] ° [address], [place] Phone: (336) 832-3150, Fax: (336) [phone]. Hours of Operation:  8:30 am - 5:30 pm, M-F.  Also accepts Medicaid and self-pay.  °[facility] [place] [address], [place] Phone: (336) 878-6027   °[facility] [address], [place], [place] (336)723-1848, Ext. 123 Mondays & Thursdays: 7-9 AM.  First 15 patients are seen on a first come, first serve basis. °  ° °Medicaid-accepting [place] Providers: ° °Organization         Address  Phone   Notes  °[facility] [address], [place] (336) 641-2100 Also accepts self-pay patients.  °[facility] [address], [place] ° (336) 856-9996   °[facility] [address], [place]  (336) 288-8857   °[facility] [address] [place] [address], [place] (336) 299-7000   °Veita Bland [address], [place]  ° (336) 373-1557 Only accepts [place] Access Medicaid patients after they have their name applied to their card.  ° °Self-Pay (no insurance) in [place]: ° °Organization         Address  Phone   Notes  °Sickle Cell Patients, [facility] [address], [place] (336) 832-1970   °[facility] [address], [place] (336) 832-4400   °[facility] [place] ° [address], [place] (336) 992-4800   °[facility]/Dr. Osei-Bonsu ° [address] [place] [address], [place] or [address], [place] (336) [phone] Phone number for both [place] and [place] locations is the same.  °[facility] [address], [place] (336) 299-0000   °[facility] [address] [place] [address], [place] or [address] (336) 852-7530 °(336) 878-2260   °[facility] [address], [place] (336) 350-1642, phone; (336) 294-5005, fax Sees patients 1st and 3rd Saturday of every month.  Must not qualify for public or private insurance (i.e. Medicaid, Medicare, [place] Health Choice, Veterans' Benefits) • Household income should be no more than 200% of the poverty level •The clinic cannot treat you if you are pregnant or think you are pregnant • Sexually transmitted diseases are not treated at the clinic.  ° ° °Dental Care: °Organization         Address  Phone  Notes  °[place] [facility] [address], [place] (336) 641-6152 Accepts children up to age 21 who are enrolled in Medicaid or [place] Health Choice; pregnant women with a Medicaid card; and children who have applied for Medicaid or [place] Health Choice, but were declined, whose parents can pay a reduced fee at time of service.  °[place]  [facility] [place]  [address], [place] (336) 641-7733 Accepts children up to age 21 who are enrolled in Medicaid or [place] Health Choice; pregnant women with a Medicaid card; and children who have applied for Medicaid or [place] Health Choice, but were declined, whose parents can pay a reduced fee at time of service.  °[facility] ° [address],  (336) 641-4533 Patients are seen by appointment only. Walk-ins are not accepted. [facility] will see patients 18 years of age and older. °Monday - Tuesday (8am-5pm) °Most Wednesdays (8:30-5pm) °$30 per visit, cash only  °[facility] ° [address], [place] (336) 641-4533 Patients are seen by appointment only. Walk-ins are not accepted. [facility] will see patients 18 years of age and older. °One   Wednesday Evening (Monthly: Volunteer Based).  $30 per visit, cash only  °[facility]  (919) 537-3737 for adults; Children under age 4, call Graduate Pediatric Dentistry at ([phone]) [phone]. Children aged 4-14, please call (919) [phone] to request a pediatric application. ° Dental services are provided in all areas of dental care including fillings, crowns and bridges, complete and partial dentures, implants, gum treatment, root canals, and extractions. Preventive care is also provided. Treatment is provided to both adults and children. °Patients are selected via a lottery and there is often a waiting list. °  °[facility] [address], °[place] ° (336) 763-8833 [email or web address] °  °[facility] [address], [place], [place] (336)723-1848, Ext. 123 Second and Fourth Thursday of each month, opens at 6:30 AM; Clinic ends at 9 AM.  Patients are seen on a first-come first-served basis, and a limited number are seen during each clinic.  ° °[facility] ° [address], [place], [place] (336) [phone]    Eligibility Requirements °You must have lived in [place], [place], or [place] counties for at least the last three months. °  You cannot be eligible for state or federal sponsored healthcare insurance, including Veterans Administration, Medicaid, or Medicare. °  You generally cannot be eligible for healthcare insurance through your employer.  °  How to apply: °Eligibility screenings are held every Tuesday and Wednesday afternoon from 1:00 pm until 4:00 pm. You do not need an appointment for the interview!  °[facility] [address], [place], [place] 336-631-2330   °[facility]  336-342-8273   °[facility]  336-703-3100   °[facility]  336-570-6415   ° °Behavioral Health Resources in the Community: °Intensive Outpatient Programs °Organization         Address  Phone  Notes  °[facility] [address], [place], [place] 336-878-6098   °[facility] [address], [place], [place] 336-832-9800   °[facility] [address], [place], [place] ° 336-882-2125   °[facility] [address],  °[place], [place] 1-800-853-5163 or 336-641-4981   °Substance Abuse Resources °Organization         Address  Phone  Notes  °[facility]  336-882-2125   °[facility]  336-784-9470   °[facility]  336-285-9073   °[facility]  336-845-3988   °Residential & Outpatient Substance Abuse Program  1-800-659-3381   °Psychological Services °Organization         Address  Phone  Notes  °[facility]  336- 832-9600   °[facility]  336- 378-7881   °[facility] [address], [place] 1-800-853-5163 or 336-641-4981   ° °Mobile Crisis Teams °Organization         Address  Phone  Notes  °[facility], Mobile Crisis Care Unit  1-877-626-1772   °Assertive °[facility] ° [address]  [place], [place] 336-834-9664   °Sharon DeEsch [address] °[place] 336-554-5454   ° °Self-Help/Support Groups °Organization         Address  Phone             Notes  °[facility] - variety of support groups  336- 373-1402 Call for more information  °Narcotics Anonymous (NA), [facility] [address], °[place]  2 meetings at this location  ° °  Residential Treatment Programs Organization         Address  Phone  Notes  [facility] [address],    [place]  1-313 073 6431   [facility]  [address] [place] [address], [place], [place]   [place], [place] [phone] Admissions: 8am-3pm M-F  [facility] [address]    [place], [place] 500-370-4888   [facility] [address] [place], [place], [place]   [facility] [address]  [place], [place]   [facility] - Intensive Outpatient [address], [place], [place]   [facility] ([place] [address]  [place], [place] 1-563-544-5810 or (678)397-9620   [facility] [address] [place], [place] Accepts Medicaid  [facility] [address],  [place] 1-(575) 187-7179 Substance Abuse/Addiction Treatment   [facility]         Address  Phone  Notes  [facility]  986-581-9341   Domenic Schwab, PhD [address] [place], [place]   (575) 247-2390 or 407-833-5175   [place], [place] 314-176-3889   [facility] [address], [place], [place] (260) [phone] Insurance/Medicaid/sponsorship through [facility] [address] [place]                                    [place], [place] 339-727-4506 [place] [address] [place], [place] [phone]    Dr. Adele Schilder  ([phone]   [facility] Dept. 1) [address] [place] 2) [place] 3)  [address], [place] [phone]  [phone])[phone]   [place] 541-150-2001 or 920-763-5510 (After Hours)      Continue to take your usual prescriptions as previously directed.  Begin to take over the counter fiber products (ie: Citucel, Metamucil) or stool softener (colace), as directed on packaging, for the next month.  Use over the counter hemorrhoidal relief products (ie:  preparation H, anusol, tucks pads), as directed on packaging, as needed for symptoms.  Sit in a warm water tub several times per day for the next week.  Call your regular medical doctor today to schedule a follow up appointment within the next 3 days.  Return to the Emergency Department immediately if worsening.

## 2015-01-18 NOTE — ED Notes (Signed)
Pt reports has been constipated recently and today after having  A bm, blood was coming out of rectum.  Pt c/o rectal pain.

## 2015-02-06 ENCOUNTER — Encounter (HOSPITAL_COMMUNITY): Payer: Self-pay | Admitting: Emergency Medicine

## 2015-02-06 DIAGNOSIS — Z79899 Other long term (current) drug therapy: Secondary | ICD-10-CM | POA: Insufficient documentation

## 2015-02-06 DIAGNOSIS — J45909 Unspecified asthma, uncomplicated: Secondary | ICD-10-CM | POA: Insufficient documentation

## 2015-02-06 DIAGNOSIS — Z720 Tobacco use: Secondary | ICD-10-CM | POA: Insufficient documentation

## 2015-02-06 DIAGNOSIS — M79675 Pain in left toe(s): Secondary | ICD-10-CM | POA: Insufficient documentation

## 2015-02-06 DIAGNOSIS — Z7982 Long term (current) use of aspirin: Secondary | ICD-10-CM | POA: Insufficient documentation

## 2015-02-06 DIAGNOSIS — Z859 Personal history of malignant neoplasm, unspecified: Secondary | ICD-10-CM | POA: Insufficient documentation

## 2015-02-06 NOTE — ED Notes (Signed)
Patient complaining of pain and numbness to the left big toe. Reports pain to left lower leg as well. States symptoms have been occurring since Friday night.

## 2015-02-07 ENCOUNTER — Emergency Department (HOSPITAL_COMMUNITY)
Admission: EM | Admit: 2015-02-07 | Discharge: 2015-02-07 | Disposition: A | Discharge status: Home / Self Care | Payer: Self-pay | Attending: Emergency Medicine | Admitting: Emergency Medicine

## 2015-02-07 DIAGNOSIS — M79675 Pain in left toe(s): Secondary | ICD-10-CM

## 2015-02-07 MED ORDER — IBUPROFEN 800 MG PO TABS
800.0000 mg | ORAL_TABLET | Freq: Once | ORAL | Status: AC
  Administered 2015-02-07: 800 mg via ORAL
  Filled 2015-02-07: qty 1

## 2015-02-07 MED ORDER — CYCLOBENZAPRINE HCL 5 MG PO TABS: 5.0000 mg | ORAL_TABLET | Freq: Three times a day (TID) | ORAL | Status: DC | PRN

## 2015-02-07 MED ORDER — CYCLOBENZAPRINE HCL 10 MG PO TABS
10.0000 mg | ORAL_TABLET | Freq: Once | ORAL | Status: AC
  Administered 2015-02-07: 10 mg via ORAL
  Filled 2015-02-07: qty 1

## 2015-02-07 MED ORDER — IBUPROFEN 800 MG PO TABS
800.0000 mg | ORAL_TABLET | Freq: Three times a day (TID) | ORAL | Status: DC
Start: 2015-02-07 — End: 2016-06-04

## 2015-02-10 NOTE — ED Provider Notes (Signed)
CSN: [id number]     Arrival date & time 02/06/15  2209 History   First MD Initiated Contact with Patient 02/07/15 0055     Chief Complaint  Patient presents with  . Toe Pain     (Consider location/radiation/quality/duration/timing/severity/associated sxs/prior Treatment) HPI   Marissa Frank is a 44 y.o. female who presents to the Emergency Department complaining of left great toe for two days.  She describes a sharp stabbing pain with numbness and tingling that intermittently radiates to her lower leg.  Pain is worse with weight bearing and radiates into her leg with upper flexion of her foot..  She reports excessive walking and standing at her job and admits to wearing shoes with poor support to her feet.  She denies redness, swelling, calf pain or swelling, nail injury.      Past Medical History  Diagnosis Date  . Asthma   . Cancer    Past Surgical History  Procedure Laterality Date  . Breast surgery    . Tubal ligation     History reviewed. No pertinent family history. History  Substance Use Topics  . Smoking status: Current Every Day Smoker -- 0.50 packs/day  . Smokeless tobacco: Not on file  . Alcohol Use: Yes     Comment: occ   OB History    No data available     Review of Systems  Constitutional: Negative for fever and chills.  Musculoskeletal: Positive for arthralgias (left great toe pain). Negative for joint swelling.  Skin: Negative for color change and wound.  Neurological: Negative for weakness and numbness.  All other systems reviewed and are negative.     Allergies  Almond oil; Coconut flavor; and Macadamia nut oil  Home Medications   Prior to Admission medications   Medication Sig Start Date End Date Taking? Authorizing Provider  aspirin 325 MG tablet Take 325 mg by mouth daily.    Historical Provider, MD  Cyanocobalamin (VITAMIN B-12 PO) Take 1 tablet by mouth daily.    Historical Provider, MD  cyclobenzaprine (FLEXERIL) 5 MG tablet  Take 1 tablet (5 mg total) by mouth 3 (three) times daily as needed. 02/07/15   Oddie Kuhlmann, PA-C  Ephedrine-Guaifenesin (PRIMATENE ASTHMA PO) Take 1 tablet by mouth daily as needed (shortness of breath).    Historical Provider, MD  ferrous sulfate 325 (65 FE) MG tablet Take 325 mg by mouth daily.      Historical Provider, MD  HYDROcodone-acetaminophen [facility]) 5-325 MG per tablet One po Q 4-6 hrs prn pain Patient not taking: Reported on 01/18/2015 03/30/11   Duaine Dredge, PA-C  ibuprofen (ADVIL,MOTRIN) 800 MG tablet Take 1 tablet (800 mg total) by mouth 3 (three) times daily. 02/07/15   Zilla Shartzer, PA-C  oxymetazoline (AFRIN) 0.05 % nasal spray Place 2 sprays into the nose 2 (two) times daily.      Historical Provider, MD  tetrahydrozoline-zinc (VISINE-AC) 0.05-0.25 % ophthalmic solution 2 drops 3 (three) times daily as needed. FOR PAIN     Historical Provider, MD   BP 101/60 mmHg  Pulse 61  Temp(Src) 98.4 F (36.9 C) (Oral)  Resp 16  Ht 5\' 1"  (1.549 m)  Wt 92 lb (41.731 kg)  BMI 17.39 kg/m2  SpO2 100%  LMP 04/03/2014 Physical Exam  Constitutional: She is oriented to person, place, and time. She appears well-developed and well-nourished. No distress.  HENT:  Head: Normocephalic and atraumatic.  Cardiovascular: Normal rate, regular rhythm, normal heart sounds and intact distal pulses.  Pulmonary/Chest: Effort normal and breath sounds normal.  Musculoskeletal: She exhibits tenderness. She exhibits no edema.  ttp of the plantar surface left great toe.  ROM is preserved.  DP pulse is brisk,distal sensation intact.  No erythema, abrasion, bruising or bony deformity.  No proximal tenderness. nail appears nml.  No discoloration.  Gross sensation of the toe is intact  Neurological: She is alert and oriented to person, place, and time. She exhibits normal muscle tone. Coordination normal.  Skin: Skin is warm and dry.  Nursing note and vitals reviewed.   ED Course  Procedures  (including critical care time) Labs Review Labs Reviewed - No data to display  Imaging Review No results found.   EKG Interpretation None      MDM   Final diagnoses:  Toe pain, left    nml appearing toe.  NV intact.  No concerning sx's for cellulitis or ingrown nail.  No proximal edema or tenderness.  Ambulates with a steady gait.  Pain likely related to excessive standing and wearing non supportive shoes.      Kem Parkinson, PA-C 02/10/15 1724  Rolland Porter, MD 02/12/15 0400

## 2015-02-13 ENCOUNTER — Emergency Department (HOSPITAL_COMMUNITY)
Admission: EM | Admit: 2015-02-13 | Discharge: 2015-02-15 | Disposition: A | Discharge status: Other Acute Inpatient Hospital | Payer: Self-pay | Attending: Emergency Medicine | Admitting: Emergency Medicine

## 2015-02-13 ENCOUNTER — Encounter (HOSPITAL_COMMUNITY): Payer: Self-pay | Admitting: *Deleted

## 2015-02-13 DIAGNOSIS — F10920 Alcohol use, unspecified with intoxication, uncomplicated: Secondary | ICD-10-CM

## 2015-02-13 DIAGNOSIS — Z859 Personal history of malignant neoplasm, unspecified: Secondary | ICD-10-CM | POA: Insufficient documentation

## 2015-02-13 DIAGNOSIS — F10120 Alcohol abuse with intoxication, uncomplicated: Secondary | ICD-10-CM | POA: Insufficient documentation

## 2015-02-13 DIAGNOSIS — R45851 Suicidal ideations: Secondary | ICD-10-CM

## 2015-02-13 DIAGNOSIS — Z79899 Other long term (current) drug therapy: Secondary | ICD-10-CM | POA: Insufficient documentation

## 2015-02-13 DIAGNOSIS — Z791 Long term (current) use of non-steroidal anti-inflammatories (NSAID): Secondary | ICD-10-CM | POA: Insufficient documentation

## 2015-02-13 DIAGNOSIS — F333 Major depressive disorder, recurrent, severe with psychotic symptoms: Secondary | ICD-10-CM | POA: Insufficient documentation

## 2015-02-13 DIAGNOSIS — Z7982 Long term (current) use of aspirin: Secondary | ICD-10-CM | POA: Insufficient documentation

## 2015-02-13 DIAGNOSIS — J45909 Unspecified asthma, uncomplicated: Secondary | ICD-10-CM | POA: Insufficient documentation

## 2015-02-13 DIAGNOSIS — Z720 Tobacco use: Secondary | ICD-10-CM | POA: Insufficient documentation

## 2015-02-13 NOTE — ED Provider Notes (Signed)
CSN: [id number]     Arrival date & time 02/13/15  2329 History  This chart was scribed for Delora Fuel, MD by Irene Pap, ED Scribe. This patient was seen in room APA17/APA17 and patient care was started at 11:53 PM.    Chief Complaint  Patient presents with  . V70.1   The history is provided by the patient. No language interpreter was used.  HPI Comments: Marissa Frank is a 44 y.o. female who presents to the Emergency Department complaining of depression and SI onset 5 months ago. Reports hx of attempt in 2008 where she took a lot of pills and was hospitalized at [facility]. States that her current plan would be the same, with different pills. States that she hears whispers and has to leave the television on; reports that she cannot sleep and has been crying. Denies any recent triggers to worsen her depression. Denies taking medication for her depression. States that she was given medication at [facility] but did not take it.   Past Medical History  Diagnosis Date  . Asthma   . Cancer    Past Surgical History  Procedure Laterality Date  . Breast surgery    . Tubal ligation    . Cervical cone biopsy     History reviewed. No pertinent family history. History  Substance Use Topics  . Smoking status: Current Every Day Smoker -- 0.50 packs/day  . Smokeless tobacco: Not on file  . Alcohol Use: Yes     Comment: occ   OB History    No data available     Review of Systems  Psychiatric/Behavioral: Positive for suicidal ideas, hallucinations and sleep disturbance.  All other systems reviewed and are negative.     Allergies  Almond oil; Coconut flavor; and Macadamia nut oil  Home Medications   Prior to Admission medications   Medication Sig Start Date End Date Taking? Authorizing Provider  aspirin 325 MG tablet Take 325 mg by mouth daily.   Yes Historical Provider, MD  Ephedrine-Guaifenesin (PRIMATENE ASTHMA PO) Take 1 tablet by mouth daily as needed (shortness of  breath).   Yes Historical Provider, MD  ferrous sulfate 325 (65 FE) MG tablet Take 325 mg by mouth daily.     Yes Historical Provider, MD  oxymetazoline (AFRIN) 0.05 % nasal spray Place 2 sprays into the nose 2 (two) times daily.     Yes Historical Provider, MD  tetrahydrozoline-zinc (VISINE-AC) 0.05-0.25 % ophthalmic solution 2 drops 3 (three) times daily as needed. FOR PAIN    Yes Historical Provider, MD  cyclobenzaprine (FLEXERIL) 5 MG tablet Take 1 tablet (5 mg total) by mouth 3 (three) times daily as needed. 02/07/15   Tammy Triplett, PA-C  HYDROcodone-acetaminophen (NORCO) 5-325 MG per tablet One po Q 4-6 hrs prn pain Patient not taking: Reported on 01/18/2015 03/30/11   Duaine Dredge, PA-C  ibuprofen (ADVIL,MOTRIN) 800 MG tablet Take 1 tablet (800 mg total) by mouth 3 (three) times daily. 02/07/15   Tammy Triplett, PA-C   BP 115/80 mmHg  Temp(Src) 97.7 F (36.5 C) (Oral)  Resp 20  SpO2 100%  LMP 04/03/2014  Physical Exam  Constitutional: She is oriented to person, place, and time. She appears well-developed and well-nourished.  HENT:  Head: Normocephalic and atraumatic.  Eyes: EOM are normal. Pupils are equal, round, and reactive to light.  Neck: Normal range of motion. Neck supple. No JVD present.  Cardiovascular: Normal rate, regular rhythm and normal heart sounds.   No  murmur heard. Pulmonary/Chest: Effort normal. She has no wheezes. She has no rales. She exhibits no tenderness.  Abdominal: Soft. Bowel sounds are normal. She exhibits no distension and no mass. There is no guarding.  Musculoskeletal: Normal range of motion. She exhibits no edema.  Lymphadenopathy:    She has no cervical adenopathy.  Neurological: She is alert and oriented to person, place, and time. No cranial nerve deficit. She exhibits normal muscle tone. Coordination normal.  Skin: Skin is warm and dry. No rash noted.  Psychiatric: She exhibits a depressed mood. She expresses suicidal ideation. She expresses  suicidal plans.  tearful  Nursing note and vitals reviewed.   ED Course  Procedures (including critical care time) DIAGNOSTIC STUDIES: Oxygen Saturation is 100% on RA, normal by my interpretation.    COORDINATION OF CARE: 11:56 PM-Discussed treatment plan which includes labs, UA, tele-psych with pt at bedside and pt agreed to plan.   Labs Review Results for orders placed or performed during the hospital encounter of 02/13/15  CBC WITH DIFFERENTIAL  Result Value Ref Range   WBC 4.3 4.0 - 10.5 K/uL   RBC 4.05 3.87 - 5.11 MIL/uL   Hemoglobin 13.8 12.0 - 15.0 g/dL   HCT 40.5 36.0 - 46.0 %   MCV 100.0 78.0 - 100.0 fL   MCH 34.1 (H) 26.0 - 34.0 pg   MCHC 34.1 30.0 - 36.0 g/dL   RDW 13.2 11.5 - 15.5 %   Platelets 340 150 - 400 K/uL   Neutrophils Relative % 35 (L) 43 - 77 %   Neutro Abs 1.5 (L) 1.7 - 7.7 K/uL   Lymphocytes Relative 49 (H) 12 - 46 %   Lymphs Abs 2.1 0.7 - 4.0 K/uL   Monocytes Relative 13 (H) 3 - 12 %   Monocytes Absolute 0.6 0.1 - 1.0 K/uL   Eosinophils Relative 2 0 - 5 %   Eosinophils Absolute 0.1 0.0 - 0.7 K/uL   Basophils Relative 1 0 - 1 %   Basophils Absolute 0.1 0.0 - 0.1 K/uL  Comprehensive metabolic panel  Result Value Ref Range   Sodium 140 135 - 145 mmol/L   Potassium 3.7 3.5 - 5.1 mmol/L   Chloride 107 101 - 111 mmol/L   CO2 25 22 - 32 mmol/L   Glucose, Bld 82 65 - 99 mg/dL   BUN 10 6 - 20 mg/dL   Creatinine, Ser 0.72 0.44 - 1.00 mg/dL   Calcium 8.6 (L) 8.9 - 10.3 mg/dL   Total Protein 6.8 6.5 - 8.1 g/dL   Albumin 3.9 3.5 - 5.0 g/dL   AST 30 15 - 41 U/L   ALT 18 14 - 54 U/L   Alkaline Phosphatase 59 38 - 126 U/L   Total Bilirubin 0.4 0.3 - 1.2 mg/dL   GFR calc non Af Amer >60 >60 mL/min   GFR calc Af Amer >60 >60 mL/min   Anion gap 8 5 - 15  Urine rapid drug screen (hosp performed)not at [facility]  Result Value Ref Range   Opiates NONE DETECTED NONE DETECTED   Cocaine NONE DETECTED NONE DETECTED   Benzodiazepines NONE DETECTED NONE DETECTED    Amphetamines NONE DETECTED NONE DETECTED   Tetrahydrocannabinol NONE DETECTED NONE DETECTED   Barbiturates NONE DETECTED NONE DETECTED  Ethanol  Result Value Ref Range   Alcohol, Ethyl (B) 153 (H) <5 mg/dL  Urinalysis, Routine w reflex microscopic (not at [facility])  Result Value Ref Range   Color, Urine YELLOW YELLOW  APPearance CLEAR CLEAR   Specific Gravity, Urine 1.020 1.005 - 1.030   pH 5.5 5.0 - 8.0   Glucose, UA NEGATIVE NEGATIVE mg/dL   Hgb urine dipstick NEGATIVE NEGATIVE   Bilirubin Urine NEGATIVE NEGATIVE   Ketones, ur TRACE (A) NEGATIVE mg/dL   Protein, ur NEGATIVE NEGATIVE mg/dL   Urobilinogen, UA 0.2 0.0 - 1.0 mg/dL   Nitrite NEGATIVE NEGATIVE   Leukocytes, UA NEGATIVE NEGATIVE   MDM   Final diagnoses:  Suicidal ideation  Severe recurrent major depressive disorder with psychotic features  Alcohol intoxication, uncomplicated    Major depression with suicidal ideation. Screening labs will be obtained and consultation obtained with TTS. Old records are reviewed and she does have a prior hospitalization at [facility] behavioral health in 2008.  TTS consultation appreciated. Patient meets criteria for inpatient psychiatric care, but no beds available at [facility] behavioral health hospital. Currently awaiting placement for an appropriate psychiatric bed.  I personally performed the services described in this documentation, which was scribed in my presence. The recorded information has been reviewed and is accurate.     Delora Fuel, MD 82/41/75 [id number]

## 2015-02-13 NOTE — BH Assessment (Signed)
Reviewed ED notes prior to initiating assessment. Pt presents with SI, onset in March, with a plan to overdose.  Requested cart be placed with pt for assessment.   Assessment to commence shortly.    Lear Ng, [facility] Triage Specialist 02/13/2015 11:58 PM

## 2015-02-13 NOTE — ED Notes (Signed)
Pt states she has had these thoughts of wanting to harm herself since March, pt states she just cant deal with it anymore; her plan would be to take an overdose of sleeping pills

## 2015-02-14 LAB — COMPREHENSIVE METABOLIC PANEL
ALT: 18 U/L (ref 14–54)
AST: 30 U/L (ref 15–41)
Albumin: 3.9 g/dL (ref 3.5–5.0)
Alkaline Phosphatase: 59 U/L (ref 38–126)
Anion gap: 8 (ref 5–15)
BILIRUBIN TOTAL: 0.4 mg/dL (ref 0.3–1.2)
BUN: 10 mg/dL (ref 6–20)
CO2: 25 mmol/L (ref 22–32)
Calcium: 8.6 mg/dL — ABNORMAL LOW (ref 8.9–10.3)
Chloride: 107 mmol/L (ref 101–111)
Creatinine, Ser: 0.72 mg/dL (ref 0.44–1.00)
GFR calc Af Amer: >60 mL/min (ref >60)
Glucose, Bld: 82 mg/dL (ref 65–99)
Potassium: 3.7 mmol/L (ref 3.5–5.1)
Sodium: 140 mmol/L (ref 135–145)
TOTAL PROTEIN: 6.8 g/dL (ref 6.5–8.1)

## 2015-02-14 LAB — CBC WITH DIFFERENTIAL/PLATELET
BASOS ABS: 0.1 10*3/uL (ref 0.0–0.1)
Basophils Relative: 1 % (ref 0–1)
EOS ABS: 0.1 10*3/uL (ref 0.0–0.7)
Eosinophils Relative: 2 % (ref 0–5)
HEMATOCRIT: 40.5 % (ref 36.0–46.0)
HEMOGLOBIN: 13.8 g/dL (ref 12.0–15.0)
LYMPHS ABS: 2.1 10*3/uL (ref 0.7–4.0)
LYMPHS PCT: 49 % — AB (ref 12–46)
MCH: 34.1 pg — AB (ref 26.0–34.0)
MCHC: 34.1 g/dL (ref 30.0–36.0)
MCV: 100 fL (ref 78.0–100.0)
MONO ABS: 0.6 10*3/uL (ref 0.1–1.0)
MONOS PCT: 13 % — AB (ref 3–12)
NEUTROS ABS: 1.5 10*3/uL — AB (ref 1.7–7.7)
NEUTROS PCT: 35 % — AB (ref 43–77)
Platelets: 340 10*3/uL (ref 150–400)
RBC: 4.05 MIL/uL (ref 3.87–5.11)
RDW: 13.2 % (ref 11.5–15.5)
WBC: 4.3 10*3/uL (ref 4.0–10.5)

## 2015-02-14 LAB — RAPID URINE DRUG SCREEN, HOSP PERFORMED
Amphetamines: NOT DETECTED
Barbiturates: NOT DETECTED
Benzodiazepines: NOT DETECTED
COCAINE: NOT DETECTED
OPIATES: NOT DETECTED
TETRAHYDROCANNABINOL: NOT DETECTED

## 2015-02-14 LAB — URINALYSIS, ROUTINE W REFLEX MICROSCOPIC
Bilirubin Urine: NEGATIVE
Glucose, UA: NEGATIVE mg/dL
HGB URINE DIPSTICK: NEGATIVE
Leukocytes, UA: NEGATIVE
Nitrite: NEGATIVE
PH: 5.5 (ref 5.0–8.0)
PROTEIN: NEGATIVE mg/dL
Specific Gravity, Urine: 1.02 (ref 1.005–1.030)
Urobilinogen, UA: 0.2 mg/dL (ref 0.0–1.0)

## 2015-02-14 LAB — ETHANOL: Alcohol, Ethyl (B): 153 mg/dL — ABNORMAL HIGH (ref <5)

## 2015-02-14 MED ORDER — FERROUS SULFATE 325 (65 FE) MG PO TABS
325.0000 mg | ORAL_TABLET | Freq: Every day | ORAL | Status: DC
  Administered 2015-02-14 – 2015-02-15 (×2): 325 mg via ORAL
  Filled 2015-02-14 (×4): qty 1

## 2015-02-14 MED ORDER — LORAZEPAM 1 MG PO TABS
0.0000 mg | ORAL_TABLET | Freq: Four times a day (QID) | ORAL | Status: DC
  Administered 2015-02-14: 2 mg via ORAL
  Administered 2015-02-15: 1 mg via ORAL
  Filled 2015-02-14: qty 2
  Filled 2015-02-14 (×2): qty 1

## 2015-02-14 MED ORDER — ASPIRIN 325 MG PO TABS
325.0000 mg | ORAL_TABLET | Freq: Every day | ORAL | Status: DC
  Administered 2015-02-14 – 2015-02-15 (×2): 325 mg via ORAL
  Filled 2015-02-14 (×2): qty 1

## 2015-02-14 MED ORDER — ZOLPIDEM TARTRATE 5 MG PO TABS: 5.0000 mg | ORAL_TABLET | Freq: Every evening | ORAL | Status: DC | PRN

## 2015-02-14 MED ORDER — ONDANSETRON HCL 4 MG PO TABS
4.0000 mg | ORAL_TABLET | Freq: Three times a day (TID) | ORAL | Status: DC | PRN
  Administered 2015-02-14 – 2015-02-15 (×2): 4 mg via ORAL
  Filled 2015-02-14 (×2): qty 1

## 2015-02-14 MED ORDER — LORAZEPAM 1 MG PO TABS
1.0000 mg | ORAL_TABLET | Freq: Three times a day (TID) | ORAL | Status: DC | PRN
  Administered 2015-02-14: 1 mg via ORAL

## 2015-02-14 MED ORDER — IBUPROFEN 400 MG PO TABS
600.0000 mg | ORAL_TABLET | Freq: Three times a day (TID) | ORAL | Status: DC | PRN
  Filled 2015-02-14: qty 2

## 2015-02-14 MED ORDER — VITAMIN B-1 100 MG PO TABS
100.0000 mg | ORAL_TABLET | Freq: Every day | ORAL | Status: DC
  Administered 2015-02-14 – 2015-02-15 (×2): 100 mg via ORAL
  Filled 2015-02-14 (×2): qty 1

## 2015-02-14 MED ORDER — THIAMINE HCL 100 MG/ML IJ SOLN
100.0000 mg | Freq: Every day | INTRAMUSCULAR | Status: DC
Start: 2015-02-14 — End: 2015-02-15

## 2015-02-14 MED ORDER — LORAZEPAM 2 MG/ML IJ SOLN: 0.0000 mg | Freq: Four times a day (QID) | INTRAMUSCULAR | Status: DC

## 2015-02-14 MED ORDER — ALUM & MAG HYDROXIDE-SIMETH 200-200-20 MG/5ML PO SUSP
30.0000 mL | ORAL | Status: DC | PRN
Start: 2015-02-14 — End: 2015-02-15

## 2015-02-14 MED ORDER — NICOTINE 7 MG/24HR TD PT24
7.0000 mg | MEDICATED_PATCH | Freq: Once | TRANSDERMAL | Status: DC
  Filled 2015-02-14 (×3): qty 1

## 2015-02-14 MED ORDER — ACETAMINOPHEN 325 MG PO TABS
650.0000 mg | ORAL_TABLET | ORAL | Status: DC | PRN
  Administered 2015-02-14 (×2): 650 mg via ORAL
  Filled 2015-02-14 (×2): qty 2

## 2015-02-14 NOTE — ED Notes (Signed)
Patient given breakfast tray and encouraged to eat. Patient withdrawn.

## 2015-02-14 NOTE — ED Notes (Signed)
Assisted to the bathroom. Pt complain of dizziness

## 2015-02-14 NOTE — ED Notes (Signed)
Pt states she needs something for hand pain
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
## 2015-02-14 NOTE — [facility] Counselor (Signed)
This Probation officer faxed out supporting documentation for placement of this patient at the following facilities:  [place], [place] OBS Counselor

## 2015-02-14 NOTE — ED Notes (Signed)
Pt awake and is ready to eat. States it is to noisy in here. Pt next door is talking non stop

## 2015-02-14 NOTE — ED Notes (Signed)
Pt sleeping at present. Sitter with pt.

## 2015-02-14 NOTE — ED Notes (Signed)
Pt states she feels weak and nauseated

## 2015-02-14 NOTE — ED Notes (Signed)
Patient has sitter at bedside. Patient sleeping at this time.

## 2015-02-14 NOTE — ED Notes (Addendum)
Pt states she hopes the other pt's are not going were she is

## 2015-02-14 NOTE — Progress Notes (Signed)
Followed up on inpatient placement efforts:  Referral made: [facility]- per Gershon Mussel [facility] per [facility]- per [facility]- per Caryl Pina, no beds currently but will review for [facility] waitlist [facility]- per Marden Noble, same as above  At capacity: [facility] [place], fax is down but beds are available, try back this afternoon [facility]- left voicemail and will refer if there is availability  Sharren Bridge, MSW, [place] Work, Disposition  02/14/2015 [phone]

## 2015-02-14 NOTE — ED Notes (Signed)
Pt request to see EDP. Dr.James aware and will see pt. Pt aware

## 2015-02-14 NOTE — ED Notes (Signed)
Patient sleeping at this time.

## 2015-02-15 MED ORDER — METHOCARBAMOL 500 MG PO TABS
1000.0000 mg | ORAL_TABLET | Freq: Once | ORAL | Status: AC
  Administered 2015-02-15: 1000 mg via ORAL
  Filled 2015-02-15: qty 2

## 2015-02-15 NOTE — Progress Notes (Signed)
Accepted to [facility] by Dr. Dareen Piano, report [phone] (per Roderic Palau). Admission is voluntary.  Spoke with [facility] RN re: pt's placement.  Sharren Bridge, MSW, [place] Clinical Social Work, Disposition  02/15/2015 [phone]

## 2015-02-15 NOTE — ED Notes (Signed)
Patient sleeping at this time.

## 2015-02-15 NOTE — ED Notes (Signed)
MD aware of patient's heart rate.

## 2015-02-15 NOTE — ED Notes (Signed)
Patient requesting something for pain. Reports cramping in hands and feet bilaterally. States, "My hand gets stuck." Offered tylenol or iburpofen as ordered prn, but patient refused and stated, "Those don't help. I need something else." Dr. Lita Mains notified.

## 2015-02-15 NOTE — ED Notes (Signed)
Pt resting quietly. Equal rise and fall of chest noted.

## 2015-02-15 NOTE — ED Notes (Signed)
Meagan from [facility] called. Pt has a bed at [facility], contact number is [phone] at [place]. Accepting Dr. Dareen Piano.

## 2015-02-15 NOTE — ED Notes (Signed)
[facility] at bedside to pick up patient. Waiting for pt to finish lunch before transport.

## 2015-03-16 ENCOUNTER — Emergency Department (HOSPITAL_COMMUNITY): Payer: Self-pay

## 2015-03-16 ENCOUNTER — Emergency Department (HOSPITAL_COMMUNITY)
Admission: EM | Admit: 2015-03-16 | Discharge: 2015-03-16 | Disposition: A | Discharge status: Home / Self Care | Payer: Self-pay | Attending: Emergency Medicine | Admitting: Emergency Medicine

## 2015-03-16 ENCOUNTER — Encounter (HOSPITAL_COMMUNITY): Payer: Self-pay

## 2015-03-16 DIAGNOSIS — Z8541 Personal history of malignant neoplasm of cervix uteri: Secondary | ICD-10-CM | POA: Insufficient documentation

## 2015-03-16 DIAGNOSIS — Z720 Tobacco use: Secondary | ICD-10-CM | POA: Insufficient documentation

## 2015-03-16 DIAGNOSIS — J45909 Unspecified asthma, uncomplicated: Secondary | ICD-10-CM | POA: Insufficient documentation

## 2015-03-16 DIAGNOSIS — Z791 Long term (current) use of non-steroidal anti-inflammatories (NSAID): Secondary | ICD-10-CM | POA: Insufficient documentation

## 2015-03-16 DIAGNOSIS — K5900 Constipation, unspecified: Secondary | ICD-10-CM | POA: Insufficient documentation

## 2015-03-16 DIAGNOSIS — Z79899 Other long term (current) drug therapy: Secondary | ICD-10-CM | POA: Insufficient documentation

## 2015-03-16 DIAGNOSIS — Z7982 Long term (current) use of aspirin: Secondary | ICD-10-CM | POA: Insufficient documentation

## 2015-03-16 DIAGNOSIS — N739 Female pelvic inflammatory disease, unspecified: Secondary | ICD-10-CM | POA: Insufficient documentation

## 2015-03-16 DIAGNOSIS — Z3202 Encounter for pregnancy test, result negative: Secondary | ICD-10-CM | POA: Insufficient documentation

## 2015-03-16 LAB — COMPREHENSIVE METABOLIC PANEL
ALBUMIN: 4.1 g/dL (ref 3.5–5.0)
ALT: 17 U/L (ref 14–54)
ANION GAP: 6 (ref 5–15)
AST: 31 U/L (ref 15–41)
Alkaline Phosphatase: 81 U/L (ref 38–126)
BUN: 5 mg/dL — ABNORMAL LOW (ref 6–20)
CHLORIDE: 104 mmol/L (ref 101–111)
CO2: 29 mmol/L (ref 22–32)
Calcium: 9 mg/dL (ref 8.9–10.3)
Creatinine, Ser: 0.61 mg/dL (ref 0.44–1.00)
GFR calc Af Amer: >60 mL/min (ref >60)
GFR calc non Af Amer: >60 mL/min (ref >60)
GLUCOSE: 90 mg/dL (ref 65–99)
POTASSIUM: 4.5 mmol/L (ref 3.5–5.1)
SODIUM: 139 mmol/L (ref 135–145)
TOTAL PROTEIN: 7.5 g/dL (ref 6.5–8.1)
Total Bilirubin: 0.4 mg/dL (ref 0.3–1.2)

## 2015-03-16 LAB — CBC WITH DIFFERENTIAL/PLATELET
BASOS PCT: 0 % (ref 0–1)
Basophils Absolute: 0 10*3/uL (ref 0.0–0.1)
Eosinophils Absolute: 0.2 10*3/uL (ref 0.0–0.7)
Eosinophils Relative: 2 % (ref 0–5)
HEMATOCRIT: 42.4 % (ref 36.0–46.0)
HEMOGLOBIN: 14.2 g/dL (ref 12.0–15.0)
Lymphocytes Relative: 12 % (ref 12–46)
Lymphs Abs: 1.4 10*3/uL (ref 0.7–4.0)
MCH: 34.1 pg — ABNORMAL HIGH (ref 26.0–34.0)
MCHC: 33.5 g/dL (ref 30.0–36.0)
MCV: 101.9 fL — ABNORMAL HIGH (ref 78.0–100.0)
MONOS PCT: 12 % (ref 3–12)
Monocytes Absolute: 1.4 10*3/uL — ABNORMAL HIGH (ref 0.1–1.0)
NEUTROS ABS: 9 10*3/uL — AB (ref 1.7–7.7)
NEUTROS PCT: 74 % (ref 43–77)
Platelets: 382 10*3/uL (ref 150–400)
RBC: 4.16 MIL/uL (ref 3.87–5.11)
RDW: 13.8 % (ref 11.5–15.5)
WBC: 12.1 10*3/uL — ABNORMAL HIGH (ref 4.0–10.5)

## 2015-03-16 LAB — URINALYSIS, ROUTINE W REFLEX MICROSCOPIC
Bilirubin Urine: NEGATIVE
Glucose, UA: NEGATIVE mg/dL
HGB URINE DIPSTICK: NEGATIVE
Leukocytes, UA: NEGATIVE
NITRITE: NEGATIVE
Protein, ur: NEGATIVE mg/dL
SPECIFIC GRAVITY, URINE: 1.02 (ref 1.005–1.030)
UROBILINOGEN UA: 1 mg/dL (ref 0.0–1.0)
pH: 6 (ref 5.0–8.0)

## 2015-03-16 LAB — WET PREP, GENITAL
Trich, Wet Prep: NONE SEEN
Yeast Wet Prep HPF POC: NONE SEEN

## 2015-03-16 LAB — POC OCCULT BLOOD, ED: Fecal Occult Bld: NEGATIVE

## 2015-03-16 LAB — LIPASE, BLOOD: Lipase: 59 U/L — ABNORMAL HIGH (ref 22–51)

## 2015-03-16 LAB — POC URINE PREG, ED: PREG TEST UR: NEGATIVE

## 2015-03-16 MED ORDER — ONDANSETRON HCL 4 MG/2ML IJ SOLN
4.0000 mg | Freq: Once | INTRAMUSCULAR | Status: AC
  Administered 2015-03-16: 4 mg via INTRAVENOUS
  Filled 2015-03-16: qty 2

## 2015-03-16 MED ORDER — IOHEXOL 300 MG/ML  SOLN
50.0000 mL | Freq: Once | INTRAMUSCULAR | Status: AC | PRN
  Administered 2015-03-16: 50 mL via ORAL

## 2015-03-16 MED ORDER — FENTANYL CITRATE (PF) 100 MCG/2ML IJ SOLN
50.0000 ug | Freq: Once | INTRAMUSCULAR | Status: AC
  Administered 2015-03-16: 50 ug via INTRAVENOUS
  Filled 2015-03-16: qty 2

## 2015-03-16 MED ORDER — IBUPROFEN 800 MG PO TABS
800.0000 mg | ORAL_TABLET | Freq: Once | ORAL | Status: AC
  Administered 2015-03-16: 800 mg via ORAL
  Filled 2015-03-16: qty 1

## 2015-03-16 MED ORDER — CEFTRIAXONE SODIUM 250 MG IJ SOLR
250.0000 mg | Freq: Once | INTRAMUSCULAR | Status: AC
  Administered 2015-03-16: 250 mg via INTRAMUSCULAR
  Filled 2015-03-16: qty 250

## 2015-03-16 MED ORDER — AZITHROMYCIN 250 MG PO TABS
1000.0000 mg | ORAL_TABLET | Freq: Once | ORAL | Status: AC
  Administered 2015-03-16: 1000 mg via ORAL
  Filled 2015-03-16: qty 4

## 2015-03-16 MED ORDER — IOHEXOL 300 MG/ML  SOLN
100.0000 mL | Freq: Once | INTRAMUSCULAR | Status: AC | PRN
  Administered 2015-03-16: 100 mL via INTRAVENOUS

## 2015-03-16 NOTE — ED Notes (Signed)
Pt c/o llq pain for past few days.  Reports nausea, no vomiting, no diarrhea.  Pt says has had a lot of gas recently as well.  LBM was today but was small.  LMP was Aug 6 but had not had a period for 3 months prior to then.

## 2015-03-16 NOTE — ED Provider Notes (Signed)
CSN: [id number]     Arrival date & time 03/16/15  0908 History   First MD Initiated Contact with Patient 03/16/15 0919     Chief Complaint  Patient presents with  . Abdominal Pain     (Consider location/radiation/quality/duration/timing/severity/associated sxs/prior Treatment) HPI Comments: Patient is a 43 year old female who presents to the emergency department with a complaint of left lower quadrant abdominal pain. The patient states that over the last 3 or 4 days she has been having increasing left abdomen pain. She states that she is very gassy from time to time. She also suffers from some constipation. She states she had an attempted bowel movement this morning, but there were just a very few small pieces of stool accompanied by gas. The patient denies any injury or trauma to the abdomen. There's been no recent changes in her medications or diet. She has nausea, but no actual vomiting reported. There's been no fever reported. Patient has not noted any blood in her stools. There's been no dysuria reported, his been no blood in the stools reported. Patient has tried lying down, and changing positions, but these have not been helpful. She's tried Tylenol but this is not helping either.  Patient is a 44 y.o. female presenting with abdominal pain. The history is provided by the patient.  Abdominal Pain Pain location:  LLQ Associated symptoms: constipation and nausea   Associated symptoms: no fever and no vomiting     Past Medical History  Diagnosis Date  . Asthma   . Cancer     cervical   Past Surgical History  Procedure Laterality Date  . Breast surgery    . Tubal ligation    . Cervical cone biopsy     No family history on file. Social History  Substance Use Topics  . Smoking status: Current Every Day Smoker -- 0.50 packs/day  . Smokeless tobacco: None  . Alcohol Use: Yes     Comment: occ   OB History    No data available     Review of Systems  Constitutional: Positive  for appetite change. Negative for fever.  Gastrointestinal: Positive for nausea, abdominal pain and constipation. Negative for vomiting and blood in stool.  All other systems reviewed and are negative.     Allergies  Almond oil; Carrot; Celery oil; Coconut flavor; and Macadamia nut oil  Home Medications   Prior to Admission medications   Medication Sig Start Date End Date Taking? Authorizing Provider  aspirin 325 MG tablet Take 325 mg by mouth daily.    Historical Provider, MD  cyclobenzaprine (FLEXERIL) 5 MG tablet Take 1 tablet (5 mg total) by mouth 3 (three) times daily as needed. 02/07/15   Tammy Triplett, PA-C  Ephedrine-Guaifenesin (PRIMATENE ASTHMA PO) Take 1 tablet by mouth daily as needed (shortness of breath).    Historical Provider, MD  ferrous sulfate 325 (65 FE) MG tablet Take 325 mg by mouth daily.      Historical Provider, MD  HYDROcodone-acetaminophen [facility]) 5-325 MG per tablet One po Q 4-6 hrs prn pain Patient not taking: Reported on 01/18/2015 03/30/11   Duaine Dredge, PA-C  ibuprofen (ADVIL,MOTRIN) 800 MG tablet Take 1 tablet (800 mg total) by mouth 3 (three) times daily. 02/07/15   Tammy Triplett, PA-C  tetrahydrozoline-zinc (VISINE-AC) 0.05-0.25 % ophthalmic solution 2 drops 3 (three) times daily as needed. FOR PAIN     Historical Provider, MD   BP 99/59 mmHg  Pulse 62  Temp(Src) 98.1 F (36.7 C) (Oral)  Resp 20  Ht 5\' 1"  (1.549 m)  Wt 93 lb 6.4 oz (42.366 kg)  BMI 17.66 kg/m2  SpO2 100%  LMP 03/05/2015 Physical Exam  Constitutional: She is oriented to person, place, and time. She appears well-developed and well-nourished.  Non-toxic appearance.  HENT:  Head: Normocephalic.  Right Ear: Tympanic membrane and external ear normal.  Left Ear: Tympanic membrane and external ear normal.  Eyes: EOM and lids are normal. Pupils are equal, round, and reactive to light.  Neck: Normal range of motion. Neck supple. Carotid bruit is not present.  Cardiovascular: Normal  rate, regular rhythm, normal heart sounds, intact distal pulses and normal pulses.   Pulmonary/Chest: Breath sounds normal. No respiratory distress.  Abdominal: Soft. Normal appearance. She exhibits no distension, no fluid wave, no ascites, no pulsatile midline mass and no mass. Bowel sounds are increased. There is no splenomegaly or hepatomegaly. There is tenderness in the left lower quadrant. There is no rigidity, no guarding and no CVA tenderness.  Genitourinary:  Chaperone present during the examination. The external anal area shows no significant changes or problems. Is no mass in the rectal vault. Stool is negative for occult blood.  There is mild to moderate discharge in the vaginal vault, and from the os of the cervix. There is cervical wall motion tenderness present. There is minimal left adnexal tenderness present. No mass appreciated, and no foreign body appreciated.  Musculoskeletal: Normal range of motion.  Lymphadenopathy:       Head (right side): No submandibular adenopathy present.       Head (left side): No submandibular adenopathy present.    She has no cervical adenopathy.  Neurological: She is alert and oriented to person, place, and time. She has normal strength. No cranial nerve deficit or sensory deficit.  Skin: Skin is warm and dry.  Psychiatric: She has a normal mood and affect. Her speech is normal.  Nursing note and vitals reviewed.   ED Course  Procedures (including critical care time) Labs Review Labs Reviewed - No data to display  Imaging Review No results found. I have personally reviewed and evaluated these images and lab results as part of my medical decision-making.   EKG Interpretation None      MDM  Comprehensive metabolic panel is well within normal limits. Lipase is slightly elevated at 59. The complete blood count is elevated at 12,100 there is no shift to the left. The urine analysis is nonacute. Shows no evidence for infection or renal  calculi. Urine pregnancy test is also negative. Stool for cold blood is negative. CT scan of the abdomen shows nonspecific fat stranding in the lateral left anterior pelvic fat which is felt to be possibly consistent with pelvic inflammatory disease. There is no adnexal mass or fluid collection appreciated. There are uterine fibroids noted. There is mild sigmoid diverticulosis, but no evidence for diverticulitis.  The examination findings as well as the CT scan findings and the laboratory findings have been discussed with the patient in terms which he understands. The patient will be treated with intramuscular Rocephin, and oral Zithromax. Testing for syphilis and HIV have also been sent to the lab for the patient. GC chlamydia has been sent to the lab for the patient. The patient is to see the primary physician, or return to the emergency department if any changes, problems, or concerns. No    Final diagnoses:  Pelvic inflammatory disease (PID)    **I have reviewed nursing notes, vital signs, and all appropriate  lab and imaging results for this patient.Lily Kocher, PA-C 03/16/15 [place], MD 04/14/15 1350

## 2015-03-16 NOTE — Discharge Instructions (Signed)
Your CT scan suggest a pelvic inflammatory disease. You were treated in the emergency department with intramuscular Rocephin, and oral Zithromax. Please see Dr. Darleene Cleaver for recheck in about 5-7 days. Please refrain from all sexual activity over the next 5-7 days. Pelvic Inflammatory Disease Pelvic inflammatory disease (PID) is an infection in some or all of the female organs. PID can be in the uterus, ovaries, fallopian tubes, or the surrounding tissues inside the lower belly area (pelvis). HOME CARE   If given, take your antibiotic medicine as told. Finish them even if you start to feel better.  Only take medicine as told by your doctor.  Do not have sex (intercourse) until treatment is done or as told by your doctor.  Tell your sex partner if you have PID. Your partner may need to be treated.  Keep all doctor visits. GET HELP RIGHT AWAY IF:   You have a fever.  You have more belly (abdominal) or lower belly pain.  You have chills.  You have pain when you pee (urinate).  You are not better after 72 hours.  You have more fluid (discharge) coming from your vagina or fluid that is not normal.  You need pain medicine from your doctor.  You throw up (vomit).  You cannot take your medicines.  Your partner has a sexually transmitted disease (STD). MAKE SURE YOU:   Understand these instructions.  Will watch your condition.  Will get help right away if you are not doing well or get worse. Document Released: 10/12/2008 Document Revised: 11/10/2012 Document Reviewed: 07/12/2011 [facility] Patient Information 2015 [place], [place]. This information is not intended to replace advice given to you by your health care provider. Make sure you discuss any questions you have with your health care provider.

## 2015-03-17 LAB — GC/CHLAMYDIA PROBE AMP (~~LOC~~) NOT AT ARMC
CHLAMYDIA, DNA PROBE: NEGATIVE
NEISSERIA GONORRHEA: NEGATIVE

## 2015-03-17 LAB — HIV ANTIBODY (ROUTINE TESTING W REFLEX): HIV SCREEN 4TH GENERATION: NONREACTIVE

## 2015-03-17 LAB — RPR: RPR: NONREACTIVE

## 2015-11-09 ENCOUNTER — Emergency Department (HOSPITAL_COMMUNITY): Payer: Self-pay

## 2015-11-09 ENCOUNTER — Emergency Department (HOSPITAL_COMMUNITY)
Admission: EM | Admit: 2015-11-09 | Discharge: 2015-11-09 | Disposition: A | Discharge status: Home / Self Care | Payer: Self-pay | Attending: Emergency Medicine | Admitting: Emergency Medicine

## 2015-11-09 ENCOUNTER — Encounter (HOSPITAL_COMMUNITY): Payer: Self-pay

## 2015-11-09 DIAGNOSIS — J069 Acute upper respiratory infection, unspecified: Secondary | ICD-10-CM | POA: Insufficient documentation

## 2015-11-09 DIAGNOSIS — F17200 Nicotine dependence, unspecified, uncomplicated: Secondary | ICD-10-CM | POA: Insufficient documentation

## 2015-11-09 DIAGNOSIS — J018 Other acute sinusitis: Secondary | ICD-10-CM | POA: Insufficient documentation

## 2015-11-09 DIAGNOSIS — J45909 Unspecified asthma, uncomplicated: Secondary | ICD-10-CM | POA: Insufficient documentation

## 2015-11-09 MED ORDER — DEXAMETHASONE 4 MG PO TABS: 4.0000 mg | ORAL_TABLET | Freq: Two times a day (BID) | ORAL | Status: DC

## 2015-11-09 MED ORDER — PROMETHAZINE-CODEINE 6.25-10 MG/5ML PO SYRP: 10.0000 mL | ORAL_SOLUTION | Freq: Four times a day (QID) | ORAL | Status: DC | PRN

## 2015-11-09 MED ORDER — AMOXICILLIN 250 MG PO CAPS
500.0000 mg | ORAL_CAPSULE | Freq: Once | ORAL | Status: AC
Start: 2015-11-09 — End: 2015-11-09
  Administered 2015-11-09: 500 mg via ORAL
  Filled 2015-11-09: qty 2

## 2015-11-09 MED ORDER — LORATADINE-PSEUDOEPHEDRINE ER 5-120 MG PO TB12: 1.0000 | ORAL_TABLET | Freq: Two times a day (BID) | ORAL | Status: DC

## 2015-11-09 MED ORDER — DEXAMETHASONE SODIUM PHOSPHATE 4 MG/ML IJ SOLN
8.0000 mg | Freq: Once | INTRAMUSCULAR | Status: AC
  Administered 2015-11-09: 8 mg via INTRAMUSCULAR
  Filled 2015-11-09: qty 2

## 2015-11-09 MED ORDER — AZITHROMYCIN 250 MG PO TABS
500.0000 mg | ORAL_TABLET | Freq: Once | ORAL | Status: AC
  Administered 2015-11-09: 500 mg via ORAL
  Filled 2015-11-09: qty 2

## 2015-11-09 MED ORDER — AZITHROMYCIN 250 MG PO TABS: ORAL_TABLET | ORAL | Status: DC

## 2015-11-09 NOTE — ED Notes (Signed)
Pt reports productive cough since last Friday with green sputum.  C/O pain in chest that is worse with coughing, sore throat, and pain behind ears.

## 2015-11-09 NOTE — ED Provider Notes (Signed)
CSN: [id number]     Arrival date & time 11/09/15  0744 History   First MD Initiated Contact with Patient 11/09/15 [phone]     Chief Complaint  Patient presents with  . Cough     (Consider location/radiation/quality/duration/timing/severity/associated sxs/prior Treatment) Patient is a 45 y.o. female presenting with cough. The history is provided by the patient.  Cough Cough characteristics:  Productive Sputum characteristics:  Green Severity:  Moderate Onset quality:  Gradual Duration:  5 days Timing:  Intermittent Progression:  Worsening Chronicity:  New Smoker: yes   Context: sick contacts and weather changes   Relieved by:  Nothing Worsened by:  Nothing tried Ineffective treatments: primatene PO. Associated symptoms: chest pain, headaches and sore throat   Risk factors: no recent travel     Past Medical History  Diagnosis Date  . Asthma   . Cancer [facility])     cervical   Past Surgical History  Procedure Laterality Date  . Breast surgery    . Tubal ligation    . Cervical cone biopsy     No family history on file. Social History  Substance Use Topics  . Smoking status: Current Every Day Smoker -- 0.50 packs/day  . Smokeless tobacco: None  . Alcohol Use: Yes     Comment: occ   OB History    No data available     Review of Systems  HENT: Positive for sore throat.   Respiratory: Positive for cough.   Cardiovascular: Positive for chest pain.  Neurological: Positive for headaches.  All other systems reviewed and are negative.     Allergies  Almond oil; Carrot; Celery oil; Coconut flavor; and Macadamia nut oil  Home Medications   Prior to Admission medications   Medication Sig Start Date End Date Taking? Authorizing Provider  aspirin 325 MG tablet Take 325 mg by mouth daily.    Historical Provider, MD  citalopram (CELEXA) 20 MG tablet Take 20 mg by mouth daily.    Historical Provider, MD  cyclobenzaprine (FLEXERIL) 5 MG tablet Take 1 tablet (5 mg total) by  mouth 3 (three) times daily as needed. Patient not taking: Reported on 03/16/2015 02/07/15   Tammy Triplett, PA-C  Ephedrine-Guaifenesin (PRIMATENE ASTHMA PO) Take 1 tablet by mouth daily as needed (shortness of breath).    Historical Provider, MD  ferrous sulfate 325 (65 FE) MG tablet Take 325 mg by mouth 3 (three) times a week.     Historical Provider, MD  HYDROcodone-acetaminophen [facility]) 5-325 MG per tablet One po Q 4-6 hrs prn pain Patient not taking: Reported on 01/18/2015 03/30/11   Duaine Dredge, PA-C  ibuprofen (ADVIL,MOTRIN) 800 MG tablet Take 1 tablet (800 mg total) by mouth 3 (three) times daily. Patient not taking: Reported on 03/16/2015 02/07/15   Tammy Triplett, PA-C  lithium carbonate 300 MG capsule Take 300 mg by mouth 2 (two) times daily with a meal.    Historical Provider, MD  naproxen sodium (ANAPROX) 220 MG tablet Take 440 mg by mouth daily as needed (pain).    Historical Provider, MD  tetrahydrozoline-zinc (VISINE-AC) 0.05-0.25 % ophthalmic solution 2 drops 3 (three) times daily as needed. FOR PAIN     Historical Provider, MD   BP 129/78 mmHg  Pulse 60  Temp(Src) 98.9 F (37.2 C) (Oral)  Resp 18  Ht 5\' 1"  (1.549 m)  Wt 43.092 kg  BMI 17.96 kg/m2  SpO2 98%  LMP 10/17/2015 Physical Exam  Constitutional: She is oriented to person, place, and time.  She appears well-developed and well-nourished.  Non-toxic appearance.  HENT:  Head: Normocephalic.  Right Ear: Tympanic membrane and external ear normal.  Left Ear: Tympanic membrane and external ear normal.  Mild increase redness of the posterior pharynx. Nasal congestion present. Mild pain to percussion of the sinuses.  Eyes: EOM and lids are normal. Pupils are equal, round, and reactive to light.  Neck: Normal range of motion. Neck supple. Carotid bruit is not present.  Cardiovascular: Normal rate, regular rhythm, normal heart sounds, intact distal pulses and normal pulses.   Pulmonary/Chest: Breath sounds normal. No  respiratory distress. She exhibits tenderness.  Abdominal: Soft. Bowel sounds are normal. There is no tenderness. There is no guarding.  Musculoskeletal: Normal range of motion.  Lymphadenopathy:       Head (right side): No submandibular adenopathy present.       Head (left side): No submandibular adenopathy present.    She has no cervical adenopathy.  Neurological: She is alert and oriented to person, place, and time. She has normal strength. No cranial nerve deficit or sensory deficit.  Skin: Skin is warm and dry.  Psychiatric: She has a normal mood and affect. Her speech is normal.  Nursing note and vitals reviewed.   ED Course  Procedures (including critical care time) Labs Review Labs Reviewed - No data to display  Imaging Review No results found. I have personally reviewed and evaluated these images and lab results as part of my medical decision-making.   EKG Interpretation None      MDM  Vital signs stable. Exam favors sinusitis and URI. Discussed importance of good hydration. No evidence for mastoid issues. Rx for zithromax, claritin D, decadron, and promethazine-codeine cough medication given. Pt will use salt water gargles and chloraseptic.   Final diagnoses:  Other acute sinusitis  URI (upper respiratory infection)    *I have reviewed nursing notes, vital signs, and all appropriate lab and imaging results for this patient.[address] PA-C 11/09/15 [id number]  Fredia Sorrow, MD 11/10/15 [phone]

## 2015-11-09 NOTE — Discharge Instructions (Signed)
Please use tylenol every 4 hours for aching . Use chloraseptic gargle and salt water gargle for assistance with you throat. Take your medications as suggested. Promethazine-codeine cough medication may cause drowsiness, use with caution.

## 2016-01-09 ENCOUNTER — Other Ambulatory Visit (HOSPITAL_COMMUNITY): Payer: Self-pay | Admitting: *Deleted

## 2016-01-09 DIAGNOSIS — Z1231 Encounter for screening mammogram for malignant neoplasm of breast: Secondary | ICD-10-CM

## 2016-01-11 ENCOUNTER — Ambulatory Visit (HOSPITAL_COMMUNITY)
Admission: RE | Admit: 2016-01-11 | Discharge: 2016-01-11 | Disposition: A | Discharge status: Home / Self Care | Payer: Self-pay | Source: Ambulatory Visit | Attending: *Deleted | Admitting: *Deleted

## 2016-01-11 DIAGNOSIS — Z1231 Encounter for screening mammogram for malignant neoplasm of breast: Secondary | ICD-10-CM

## 2016-03-05 ENCOUNTER — Emergency Department (HOSPITAL_COMMUNITY)
Admission: EM | Admit: 2016-03-05 | Discharge: 2016-03-05 | Disposition: A | Discharge status: Home / Self Care | Payer: Self-pay | Attending: Emergency Medicine | Admitting: Emergency Medicine

## 2016-03-05 ENCOUNTER — Emergency Department (HOSPITAL_COMMUNITY): Payer: Self-pay

## 2016-03-05 ENCOUNTER — Encounter (HOSPITAL_COMMUNITY): Payer: Self-pay | Admitting: Emergency Medicine

## 2016-03-05 DIAGNOSIS — F17200 Nicotine dependence, unspecified, uncomplicated: Secondary | ICD-10-CM | POA: Insufficient documentation

## 2016-03-05 DIAGNOSIS — Z79899 Other long term (current) drug therapy: Secondary | ICD-10-CM | POA: Insufficient documentation

## 2016-03-05 DIAGNOSIS — Z7982 Long term (current) use of aspirin: Secondary | ICD-10-CM | POA: Insufficient documentation

## 2016-03-05 DIAGNOSIS — K6289 Other specified diseases of anus and rectum: Secondary | ICD-10-CM | POA: Insufficient documentation

## 2016-03-05 DIAGNOSIS — Z791 Long term (current) use of non-steroidal anti-inflammatories (NSAID): Secondary | ICD-10-CM | POA: Insufficient documentation

## 2016-03-05 DIAGNOSIS — J45909 Unspecified asthma, uncomplicated: Secondary | ICD-10-CM | POA: Insufficient documentation

## 2016-03-05 LAB — CBC WITH DIFFERENTIAL/PLATELET
BASOS ABS: 0 10*3/uL (ref 0.0–0.1)
BASOS PCT: 1 %
Eosinophils Absolute: 0.1 10*3/uL (ref 0.0–0.7)
Eosinophils Relative: 2 %
HEMATOCRIT: 31.4 % — AB (ref 36.0–46.0)
HEMOGLOBIN: 10.5 g/dL — AB (ref 12.0–15.0)
LYMPHS PCT: 28 %
Lymphs Abs: 1.2 10*3/uL (ref 0.7–4.0)
MCH: 29 pg (ref 26.0–34.0)
MCHC: 33.4 g/dL (ref 30.0–36.0)
MCV: 86.7 fL (ref 78.0–100.0)
MONO ABS: 0.8 10*3/uL (ref 0.1–1.0)
MONOS PCT: 19 %
NEUTROS ABS: 2.1 10*3/uL (ref 1.7–7.7)
NEUTROS PCT: 50 %
Platelets: 266 10*3/uL (ref 150–400)
RBC: 3.62 MIL/uL — ABNORMAL LOW (ref 3.87–5.11)
RDW: 17.8 % — ABNORMAL HIGH (ref 11.5–15.5)
WBC: 4.3 10*3/uL (ref 4.0–10.5)

## 2016-03-05 LAB — BASIC METABOLIC PANEL
ANION GAP: 6 (ref 5–15)
BUN: 15 mg/dL (ref 6–20)
CALCIUM: 8.1 mg/dL — AB (ref 8.9–10.3)
CO2: 23 mmol/L (ref 22–32)
Chloride: 108 mmol/L (ref 101–111)
Creatinine, Ser: 0.6 mg/dL (ref 0.44–1.00)
GFR calc non Af Amer: >60 mL/min (ref >60)
GLUCOSE: 85 mg/dL (ref 65–99)
POTASSIUM: 3.4 mmol/L — AB (ref 3.5–5.1)
Sodium: 137 mmol/L (ref 135–145)

## 2016-03-05 MED ORDER — CIPROFLOXACIN HCL 500 MG PO TABS: 500.0000 mg | ORAL_TABLET | Freq: Two times a day (BID) | ORAL | 0 refills | Status: DC

## 2016-03-05 MED ORDER — SODIUM CHLORIDE 0.9 % IV BOLUS (SEPSIS)
1000.0000 mL | Freq: Once | INTRAVENOUS | Status: AC
  Administered 2016-03-05: 1000 mL via INTRAVENOUS

## 2016-03-05 MED ORDER — DIATRIZOATE MEGLUMINE & SODIUM 66-10 % PO SOLN
ORAL | Status: AC
  Filled 2016-03-05: qty 30

## 2016-03-05 MED ORDER — SODIUM CHLORIDE 0.9 % IV SOLN
INTRAVENOUS | Status: DC
  Administered 2016-03-05: 100 mL/h via INTRAVENOUS

## 2016-03-05 MED ORDER — PREDNISONE 10 MG PO TABS: 40.0000 mg | ORAL_TABLET | Freq: Every day | ORAL | 0 refills | Status: DC

## 2016-03-05 MED ORDER — PROMETHAZINE HCL 25 MG PO TABS: 25.0000 mg | ORAL_TABLET | Freq: Four times a day (QID) | ORAL | 1 refills | Status: DC | PRN

## 2016-03-05 MED ORDER — ONDANSETRON HCL 4 MG/2ML IJ SOLN
4.0000 mg | Freq: Once | INTRAMUSCULAR | Status: AC
  Administered 2016-03-05: 4 mg via INTRAVENOUS
  Filled 2016-03-05: qty 2

## 2016-03-05 MED ORDER — ONDANSETRON HCL 4 MG/2ML IJ SOLN
4.0000 mg | Freq: Once | INTRAMUSCULAR | Status: AC
  Administered 2016-03-05: 4 mg via INTRAVENOUS

## 2016-03-05 MED ORDER — METHYLPREDNISOLONE SODIUM SUCC 125 MG IJ SOLR
125.0000 mg | Freq: Once | INTRAMUSCULAR | Status: AC
  Administered 2016-03-05: 125 mg via INTRAVENOUS
  Filled 2016-03-05: qty 2

## 2016-03-05 MED ORDER — HYDROCODONE-ACETAMINOPHEN 5-325 MG PO TABS: 1.0000 | ORAL_TABLET | Freq: Four times a day (QID) | ORAL | 0 refills | Status: DC | PRN

## 2016-03-05 MED ORDER — SODIUM CHLORIDE 0.9 % IV SOLN
3.0000 g | Freq: Once | INTRAVENOUS | Status: AC
  Administered 2016-03-05: 3 g via INTRAVENOUS
  Filled 2016-03-05: qty 3

## 2016-03-05 MED ORDER — ONDANSETRON HCL 4 MG/2ML IJ SOLN
INTRAMUSCULAR | Status: AC
  Filled 2016-03-05: qty 2

## 2016-03-05 MED ORDER — METRONIDAZOLE 500 MG PO TABS: 500.0000 mg | ORAL_TABLET | Freq: Three times a day (TID) | ORAL | 0 refills | Status: DC

## 2016-03-05 MED ORDER — IOPAMIDOL (ISOVUE-300) INJECTION 61%
100.0000 mL | Freq: Once | INTRAVENOUS | Status: AC | PRN
  Administered 2016-03-05: 100 mL via INTRAVENOUS

## 2016-03-05 MED ORDER — HYDROMORPHONE HCL 1 MG/ML IJ SOLN
1.0000 mg | Freq: Once | INTRAMUSCULAR | Status: AC
  Administered 2016-03-05: 1 mg via INTRAVENOUS
  Filled 2016-03-05: qty 1

## 2016-03-05 NOTE — Discharge Instructions (Signed)
Take antibiotics as directed. Take the steroid prednisone as directed. Take pain medicine and antinausea medicine Phenergan as needed. Make an appointment to follow-up with GI medicine for further follow-up. Return for any new or worse symptoms.

## 2016-03-05 NOTE — ED Notes (Signed)
Patient given discharge instruction, verbalized understand. IV removed, band aid applied. Patient ambulatory out of the department.  

## 2016-03-05 NOTE — ED Notes (Signed)
Pt back from Xray by Audrea Muscat, warm blanket given

## 2016-03-05 NOTE — ED Provider Notes (Signed)
[facility] DEPT Provider Note   CSN: [id number] Arrival date & time: 03/05/16  [id number]  First Provider Contact:  First MD Initiated Contact with Patient 03/05/16 [phone]     By signing my name below, I, Rayna Sexton, attest that this documentation has been prepared under the direction and in the presence of Fredia Sorrow, MD. Electronically Signed: Rayna Sexton, ED Scribe. 03/05/16. 8:35 AM.    History   Chief Complaint Chief Complaint  Patient presents with  . Hemorrhoids    HPI HPI Comments: Marissa Frank is a 45 y.o. female who presents to the Emergency Department complaining of worsening, moderate, rectal pain x 1 day. Pt reports a PMHx of hemorrhoids and states her symptoms are consistent with prior instances of hemorrhoids. She reports intermittent, mild, blood/pus noted when wiping s/p BM as well as mild, diffuse, abd pain which began this morning. Her last nml BM was yesterday. Pt is currently taking iron supplements for anemia. She denies any other associated symptoms at this time.   She denies any other associated symptoms at this time.  The history is provided by the patient. No language interpreter was used.    Past Medical History:  Diagnosis Date  . Asthma   . Cancer ([place])    cervical    There are no active problems to display for this patient.   Past Surgical History:  Procedure Laterality Date  . BREAST SURGERY    . CERVICAL CONE BIOPSY    . TUBAL LIGATION      OB History    No data available       Home Medications    Prior to Admission medications   Medication Sig Start Date End Date Taking? Authorizing Provider  aspirin 325 MG tablet Take 325 mg by mouth daily.   Yes Historical Provider, MD  Cyanocobalamin (VITAMIN B-12 PO) Take 1 tablet by mouth daily.   Yes Historical Provider, MD  Ephedrine-Guaifenesin (PRIMATENE ASTHMA PO) Take 1 tablet by mouth daily as needed (shortness of breath).   Yes Historical Provider, MD  ferrous  sulfate 325 (65 FE) MG tablet Take 325 mg by mouth daily with breakfast.    Yes Historical Provider, MD  naproxen sodium (ANAPROX) 220 MG tablet Take 220 mg by mouth daily.    Yes Historical Provider, MD  tetrahydrozoline-zinc (VISINE-AC) 0.05-0.25 % ophthalmic solution 2 drops 3 (three) times daily as needed. FOR PAIN    Yes Historical Provider, MD  ciprofloxacin (CIPRO) 500 MG tablet Take 1 tablet (500 mg total) by mouth 2 (two) times daily. 03/05/16   Fredia Sorrow, MD  cyclobenzaprine (FLEXERIL) 5 MG tablet Take 1 tablet (5 mg total) by mouth 3 (three) times daily as needed. Patient not taking: Reported on 03/16/2015 02/07/15   Tammy Triplett, PA-C  dexamethasone (DECADRON) 4 MG tablet Take 1 tablet (4 mg total) by mouth 2 (two) times daily with a meal. Patient not taking: Reported on 03/05/2016 11/09/15   Lily Kocher, PA-C  HYDROcodone-acetaminophen [facility]) 5-325 MG per tablet One po Q 4-6 hrs prn pain Patient not taking: Reported on 01/18/2015 03/30/11   Duaine Dredge, PA-C  HYDROcodone-acetaminophen (NORCO/VICODIN) 5-325 MG tablet Take 1-2 tablets by mouth every 6 (six) hours as needed. 03/05/16   Fredia Sorrow, MD  ibuprofen (ADVIL,MOTRIN) 800 MG tablet Take 1 tablet (800 mg total) by mouth 3 (three) times daily. Patient not taking: Reported on 03/16/2015 02/07/15   Tammy Triplett, PA-C  loratadine-pseudoephedrine (CLARITIN-D 12 HOUR) 5-120 MG tablet Take 1  tablet by mouth 2 (two) times daily. Patient not taking: Reported on 03/05/2016 11/09/15   Lily Kocher, PA-C  metroNIDAZOLE (FLAGYL) 500 MG tablet Take 1 tablet (500 mg total) by mouth 3 (three) times daily. 03/05/16   Fredia Sorrow, MD  predniSONE (DELTASONE) 10 MG tablet Take 4 tablets (40 mg total) by mouth daily. 03/05/16   Fredia Sorrow, MD  promethazine (PHENERGAN) 25 MG tablet Take 1 tablet (25 mg total) by mouth every 6 (six) hours as needed. 03/05/16   Fredia Sorrow, MD  promethazine-codeine (PHENERGAN WITH CODEINE) 6.25-10 MG/5ML  syrup Take 10 mLs by mouth every 6 (six) hours as needed. Patient not taking: Reported on 03/05/2016 11/09/15   Lily Kocher, PA-C    Family History History reviewed. No pertinent family history.  Social History Social History  Substance Use Topics  . Smoking status: Current Every Day Smoker    Packs/day: 0.50  . Smokeless tobacco: Never Used  . Alcohol use Yes     Comment: occ     Allergies   Almond oil; Carrot [daucus carota]; Celery oil; Coconut flavor; and Macadamia nut oil   Review of Systems Review of Systems  Constitutional: Negative for chills and fever.  HENT: Negative for congestion, postnasal drip, rhinorrhea, sinus pressure and sore throat.   Eyes: Negative for visual disturbance.  Respiratory: Negative for cough and shortness of breath.   Cardiovascular: Negative for chest pain and leg swelling.  Gastrointestinal: Positive for abdominal pain and rectal pain. Negative for anal bleeding, diarrhea, nausea and vomiting.  Genitourinary: Negative for dysuria and hematuria.  Musculoskeletal: Positive for back pain.  Skin: Negative for rash.  Neurological: Negative for syncope, weakness, light-headedness and headaches.  Hematological: Does not bruise/bleed easily.  Psychiatric/Behavioral: Negative for confusion.  All other systems reviewed and are negative.    Physical Exam Updated Vital Signs BP 105/67 (BP Location: Right Arm)   Pulse (!) 51   Temp 98.4 F (36.9 C) (Oral)   Resp 15   Ht 5\' 1"  (1.549 m)   Wt 41.7 kg   LMP 01/04/2016   SpO2 95%   BMI 17.38 kg/m   Physical Exam  Constitutional: She is oriented to person, place, and time. She appears well-developed and well-nourished. No distress.  HENT:  Head: Normocephalic and atraumatic.  Mouth/Throat: Oropharynx is clear and moist.  Eyes: Conjunctivae and EOM are normal. Pupils are equal, round, and reactive to light. No scleral icterus.  Neck: Normal range of motion. Neck supple. No tracheal deviation  present.  Cardiovascular: Normal rate, regular rhythm, normal heart sounds and intact distal pulses.  Exam reveals no gallop and no friction rub.   No murmur heard. Pulmonary/Chest: Effort normal and breath sounds normal. No respiratory distress. She has no wheezes. She has no rales.  Abdominal: Soft. Bowel sounds are normal. There is no tenderness.  Genitourinary:  Genitourinary Comments: Perianal area with a 1 cm external hemorrhoid with some mild thrombosis. No active bleeding. Anterior to that area there is an area of induration that measures about 2 x 3 cm. No evidence of any purulent discharge. No evidence of any active bleeding.  Musculoskeletal: Normal range of motion.  No swelling of bilateral ankles  Neurological: She is alert and oriented to person, place, and time. No cranial nerve deficit. She exhibits normal muscle tone. Coordination normal.  Skin: Skin is warm and dry.  Psychiatric: She has a normal mood and affect. Her behavior is normal.  Nursing note and vitals reviewed.   ED  Treatments / Results  Labs (all labs ordered are listed, but only abnormal results are displayed) Labs Reviewed  BASIC METABOLIC PANEL - Abnormal; Notable for the following:       Result Value   Potassium 3.4 (*)    Calcium 8.1 (*)    All other components within normal limits  CBC WITH DIFFERENTIAL/PLATELET - Abnormal; Notable for the following:    RBC 3.62 (*)    Hemoglobin 10.5 (*)    HCT 31.4 (*)    RDW 17.8 (*)    All other components within normal limits   Results for orders placed or performed during the hospital encounter of [id number]  Basic metabolic panel  Result Value Ref Range   Sodium 137 135 - 145 mmol/L   Potassium 3.4 (L) 3.5 - 5.1 mmol/L   Chloride 108 101 - 111 mmol/L   CO2 23 22 - 32 mmol/L   Glucose, Bld 85 65 - 99 mg/dL   BUN 15 6 - 20 mg/dL   Creatinine, Ser 0.60 0.44 - 1.00 mg/dL   Calcium 8.1 (L) 8.9 - 10.3 mg/dL   GFR calc non Af Amer >60 >60 mL/min   GFR calc  Af Amer >60 >60 mL/min   Anion gap 6 5 - 15  CBC with Differential/Platelet  Result Value Ref Range   WBC 4.3 4.0 - 10.5 K/uL   RBC 3.62 (L) 3.87 - 5.11 MIL/uL   Hemoglobin 10.5 (L) 12.0 - 15.0 g/dL   HCT 31.4 (L) 36.0 - 46.0 %   MCV 86.7 78.0 - 100.0 fL   MCH 29.0 26.0 - 34.0 pg   MCHC 33.4 30.0 - 36.0 g/dL   RDW 17.8 (H) 11.5 - 15.5 %   Platelets 266 150 - 400 K/uL   Neutrophils Relative % 50 %   Neutro Abs 2.1 1.7 - 7.7 K/uL   Lymphocytes Relative 28 %   Lymphs Abs 1.2 0.7 - 4.0 K/uL   Monocytes Relative 19 %   Monocytes Absolute 0.8 0.1 - 1.0 K/uL   Eosinophils Relative 2 %   Eosinophils Absolute 0.1 0.0 - 0.7 K/uL   Basophils Relative 1 %   Basophils Absolute 0.0 0.0 - 0.1 K/uL     EKG  EKG Interpretation None       Radiology Ct Abdomen Pelvis W Contrast  Result Date: 03/05/2016 CLINICAL DATA:  Worsening moderate rectal pain for 1 day EXAM: CT ABDOMEN AND PELVIS WITH CONTRAST TECHNIQUE: Multidetector CT imaging of the abdomen and pelvis was performed using the standard protocol following bolus administration of intravenous contrast. Sagittal and coronal MPR images reconstructed from axial data set. CONTRAST:  162mL ISOVUE-300 IOPAMIDOL (ISOVUE-300) INJECTION 61% IV. Dilute oral contrast. COMPARISON:  03/16/2015 FINDINGS: Lower chest:  Mild dependent bibasilar atelectasis Hepatobiliary: Liver and gallbladder normal appearance Pancreas: Normal appearance Spleen: Normal appearance Adrenals/Urinary Tract: Adrenal glands normal appearance. Kidneys, ureters, and bladder normal appearance Stomach/Bowel: Normal appendix. Stomach and small bowel loops normal appearance. Mild rectal wall thickening compatible with proctitis/colitis. Remainder of colon normal appearance. Vascular/Lymphatic: Aorta normal caliber.  No adenopathy. Reproductive: Uterine masses most likely representing leiomyomata, largest 4.6 x 4.1 cm image 69. Unremarkable adnexa. Other: No free air or free fluid.  No  hernia. Musculoskeletal: Osseous structures unremarkable. IMPRESSION: Rectal wall thickening compatible with proctitis/colitis. Differential diagnosis includes infection and inflammatory bowel disease. Probable uterine leiomyomata. Electronically Signed   By: Lavonia Dana M.D.   On: 03/05/2016 12:14    Procedures Procedures  DIAGNOSTIC STUDIES: Oxygen  Saturation is 98% on RA, normal by my interpretation.    COORDINATION OF CARE: 8:34 AM Discussed next steps with pt. Pt verbalized understanding and is agreeable with the plan.    Medications Ordered in ED Medications  0.9 %  sodium chloride infusion (100 mL/hr Intravenous New Bag/Given 03/05/16 1150)  diatrizoate meglumine-sodium (GASTROGRAFIN) 66-10 % solution (not administered)  Ampicillin-Sulbactam (UNASYN) 3 g in sodium chloride 0.9 % 100 mL IVPB (3 g Intravenous New Bag/Given 03/05/16 1343)  sodium chloride 0.9 % bolus 1,000 mL (0 mLs Intravenous Stopped 03/05/16 1100)  ondansetron (ZOFRAN) injection 4 mg (4 mg Intravenous Given 03/05/16 0902)  HYDROmorphone (DILAUDID) injection 1 mg (1 mg Intravenous Given 03/05/16 0902)  ondansetron (ZOFRAN) injection 4 mg (4 mg Intravenous Given 03/05/16 1109)  iopamidol (ISOVUE-300) 61 % injection 100 mL (100 mLs Intravenous Contrast Given 03/05/16 1131)  methylPREDNISolone sodium succinate (SOLU-MEDROL) 125 mg/2 mL injection 125 mg (125 mg Intravenous Given 03/05/16 1342)     Initial Impression / Assessment and Plan / ED Course  I have reviewed the triage vital signs and the nursing notes.  Pertinent labs & imaging results that were available during my care of the patient were reviewed by me and considered in my medical decision making (see chart for details).  Clinical Course   Patient presented with rectal pain. Says has rectal bleeding as well as had some pus come out. On examination there was a external hemorrhoid with a small thrombosis but not enough to explain the symptoms. There was induration  anterior to the anal area and there was some concern on exam for a perirectal abscess. CT scan of the abdomen shows no evidence of abscess but shows significant proctitis. This could be inflammatory could be infectious. Patient treated here with some IV Unasyn inside Medrol. Will be continued on Cipro and Flagyl as well as a course of prednisone and follow-up with GI medicine. Patient also will require treatment for the pain with hydrocodone and the nausea with Phenergan. Work note provided.  Patient's blood counts without any significant abnormalities. Despite the findings of the proctitis no significant leukocytosis. No fevers.   I personally performed the services described in this documentation, which was scribed in my presence. The recorded information has been reviewed and is accurate.     Final Clinical Impressions(s) / ED Diagnoses   Final diagnoses:  Proctitis    New Prescriptions New Prescriptions   CIPROFLOXACIN (CIPRO) 500 MG TABLET    Take 1 tablet (500 mg total) by mouth 2 (two) times daily.   HYDROCODONE-ACETAMINOPHEN (NORCO/VICODIN) 5-325 MG TABLET    Take 1-2 tablets by mouth every 6 (six) hours as needed.   METRONIDAZOLE (FLAGYL) 500 MG TABLET    Take 1 tablet (500 mg total) by mouth 3 (three) times daily.   PREDNISONE (DELTASONE) 10 MG TABLET    Take 4 tablets (40 mg total) by mouth daily.   PROMETHAZINE (PHENERGAN) 25 MG TABLET    Take 1 tablet (25 mg total) by mouth every 6 (six) hours as needed.     Fredia Sorrow, MD 03/05/16 1353

## 2016-03-05 NOTE — ED Triage Notes (Addendum)
Pt reports recurrent hemorrhoids since age 45. Pt reports most recent episode started 03/04/16. Pt reports "blood and pus" noted when wiped yesterday. Pt reports history of anemia and is currently on iron pills. Pt denies any abd pain/n/v/d/fever. LBM 03/04/16.

## 2016-03-05 NOTE — ED Notes (Signed)
Pt up for discharge, Pt has antibiotic infusion before discharge

## 2016-06-04 ENCOUNTER — Encounter (HOSPITAL_COMMUNITY): Payer: Self-pay | Admitting: *Deleted

## 2016-06-04 ENCOUNTER — Emergency Department (HOSPITAL_COMMUNITY)
Admission: EM | Admit: 2016-06-04 | Discharge: 2016-06-04 | Disposition: A | Discharge status: Home / Self Care | Payer: Self-pay | Attending: Emergency Medicine | Admitting: Emergency Medicine

## 2016-06-04 ENCOUNTER — Emergency Department (HOSPITAL_COMMUNITY): Payer: Self-pay

## 2016-06-04 DIAGNOSIS — F17200 Nicotine dependence, unspecified, uncomplicated: Secondary | ICD-10-CM | POA: Insufficient documentation

## 2016-06-04 DIAGNOSIS — R1032 Left lower quadrant pain: Secondary | ICD-10-CM | POA: Insufficient documentation

## 2016-06-04 DIAGNOSIS — Z79899 Other long term (current) drug therapy: Secondary | ICD-10-CM | POA: Insufficient documentation

## 2016-06-04 DIAGNOSIS — Z8541 Personal history of malignant neoplasm of cervix uteri: Secondary | ICD-10-CM | POA: Insufficient documentation

## 2016-06-04 DIAGNOSIS — Z7982 Long term (current) use of aspirin: Secondary | ICD-10-CM | POA: Insufficient documentation

## 2016-06-04 DIAGNOSIS — R1030 Lower abdominal pain, unspecified: Secondary | ICD-10-CM

## 2016-06-04 DIAGNOSIS — J45909 Unspecified asthma, uncomplicated: Secondary | ICD-10-CM | POA: Insufficient documentation

## 2016-06-04 LAB — COMPREHENSIVE METABOLIC PANEL
ALBUMIN: 3.6 g/dL (ref 3.5–5.0)
ALT: 19 U/L (ref 14–54)
AST: 43 U/L — AB (ref 15–41)
Alkaline Phosphatase: 59 U/L (ref 38–126)
Anion gap: 8 (ref 5–15)
BUN: 9 mg/dL (ref 6–20)
CHLORIDE: 105 mmol/L (ref 101–111)
CO2: 25 mmol/L (ref 22–32)
CREATININE: 0.68 mg/dL (ref 0.44–1.00)
Calcium: 8.9 mg/dL (ref 8.9–10.3)
GFR calc Af Amer: >60 mL/min (ref >60)
GFR calc non Af Amer: >60 mL/min (ref >60)
GLUCOSE: 85 mg/dL (ref 65–99)
POTASSIUM: 3.9 mmol/L (ref 3.5–5.1)
SODIUM: 138 mmol/L (ref 135–145)
Total Bilirubin: 0.4 mg/dL (ref 0.3–1.2)
Total Protein: 6.8 g/dL (ref 6.5–8.1)

## 2016-06-04 LAB — CBC WITH DIFFERENTIAL/PLATELET
BASOS ABS: 0.1 10*3/uL (ref 0.0–0.1)
BASOS PCT: 1 %
EOS PCT: 2 %
Eosinophils Absolute: 0.1 10*3/uL (ref 0.0–0.7)
HCT: 33.8 % — ABNORMAL LOW (ref 36.0–46.0)
Hemoglobin: 11.1 g/dL — ABNORMAL LOW (ref 12.0–15.0)
LYMPHS PCT: 33 %
Lymphs Abs: 1.3 10*3/uL (ref 0.7–4.0)
MCH: 28.9 pg (ref 26.0–34.0)
MCHC: 32.8 g/dL (ref 30.0–36.0)
MCV: 88 fL (ref 78.0–100.0)
MONO ABS: 0.6 10*3/uL (ref 0.1–1.0)
Monocytes Relative: 15 %
Neutro Abs: 1.8 10*3/uL (ref 1.7–7.7)
Neutrophils Relative %: 49 %
PLATELETS: 393 10*3/uL (ref 150–400)
RBC: 3.84 MIL/uL — AB (ref 3.87–5.11)
RDW: 17.4 % — AB (ref 11.5–15.5)
WBC: 3.8 10*3/uL — AB (ref 4.0–10.5)

## 2016-06-04 LAB — I-STAT BETA HCG BLOOD, ED (MC, WL, AP ONLY)

## 2016-06-04 LAB — URINALYSIS, ROUTINE W REFLEX MICROSCOPIC
BILIRUBIN URINE: NEGATIVE
GLUCOSE, UA: NEGATIVE mg/dL
HGB URINE DIPSTICK: NEGATIVE
Ketones, ur: NEGATIVE mg/dL
Leukocytes, UA: NEGATIVE
Nitrite: NEGATIVE
PH: 6 (ref 5.0–8.0)
Protein, ur: NEGATIVE mg/dL
SPECIFIC GRAVITY, URINE: 1.02 (ref 1.005–1.030)

## 2016-06-04 LAB — ETHANOL: Alcohol, Ethyl (B): 114 mg/dL — ABNORMAL HIGH (ref <5)

## 2016-06-04 MED ORDER — IOPAMIDOL (ISOVUE-300) INJECTION 61%
100.0000 mL | Freq: Once | INTRAVENOUS | Status: AC | PRN
  Administered 2016-06-04: 100 mL via INTRAVENOUS

## 2016-06-04 MED ORDER — MORPHINE SULFATE (PF) 2 MG/ML IV SOLN
2.0000 mg | Freq: Once | INTRAVENOUS | Status: AC
  Administered 2016-06-04: 2 mg via INTRAVENOUS
  Filled 2016-06-04: qty 1

## 2016-06-04 MED ORDER — ONDANSETRON HCL 4 MG/2ML IJ SOLN
4.0000 mg | Freq: Once | INTRAMUSCULAR | Status: AC
  Administered 2016-06-04: 4 mg via INTRAVENOUS
  Filled 2016-06-04: qty 2

## 2016-06-04 MED ORDER — FAMOTIDINE IN NACL 20-0.9 MG/50ML-% IV SOLN
20.0000 mg | Freq: Once | INTRAVENOUS | Status: AC
  Administered 2016-06-04: 20 mg via INTRAVENOUS
  Filled 2016-06-04: qty 50

## 2016-06-04 MED ORDER — SODIUM CHLORIDE 0.9 % IV BOLUS (SEPSIS)
500.0000 mL | Freq: Once | INTRAVENOUS | Status: AC
  Administered 2016-06-04: 500 mL via INTRAVENOUS

## 2016-06-04 MED ORDER — DICYCLOMINE HCL 20 MG PO TABS: ORAL_TABLET | ORAL | 0 refills | Status: DC

## 2016-06-04 MED ORDER — IOPAMIDOL (ISOVUE-300) INJECTION 61%
INTRAVENOUS | Status: AC
  Administered 2016-06-04: 30 mL via ORAL
  Filled 2016-06-04: qty 30

## 2016-06-04 NOTE — ED Provider Notes (Signed)
[facility] DEPT Provider Note   CSN: [id number] Arrival date & time: 06/04/16  0701     History   Chief Complaint Chief Complaint  Patient presents with  . Abdominal Pain  . Leg Pain    HPI Marissa Frank is a 45 y.o. female.  Patient complains of lower abdominal pain off and on for a few days.   The history is provided by the patient. No language interpreter was used.  Abdominal Pain   This is a new problem. The current episode started more than 2 days ago. The problem occurs rarely. The problem has not changed since onset.Associated with: Nothing. The pain is located in the RLQ and LLQ. The pain is at a severity of 5/10. The pain is moderate. Pertinent negatives include diarrhea, frequency, hematuria and headaches.  Leg Pain      Past Medical History:  Diagnosis Date  . Asthma   . Cancer ([place])    cervical    There are no active problems to display for this patient.   Past Surgical History:  Procedure Laterality Date  . BREAST SURGERY    . CERVICAL CONE BIOPSY    . TUBAL LIGATION      OB History    No data available       Home Medications    Prior to Admission medications   Medication Sig Start Date End Date Taking? Authorizing Provider  aspirin 325 MG tablet Take 325 mg by mouth daily.   Yes Historical Provider, MD  Cyanocobalamin (VITAMIN B-12 PO) Take 1 tablet by mouth daily.   Yes Historical Provider, MD  ferrous sulfate 325 (65 FE) MG tablet Take 325 mg by mouth daily with breakfast.    Yes Historical Provider, MD  HYDROcodone-acetaminophen (NORCO/VICODIN) 5-325 MG tablet Take 1-2 tablets by mouth every 6 (six) hours as needed. 03/05/16  Yes Fredia Sorrow, MD  ciprofloxacin (CIPRO) 500 MG tablet Take 1 tablet (500 mg total) by mouth 2 (two) times daily. Patient not taking: Reported on 06/04/2016 03/05/16   Fredia Sorrow, MD  dicyclomine (BENTYL) 20 MG tablet Take one every 6 hours as needed for abdominal pain 06/04/16   Milton Ferguson, MD    metroNIDAZOLE (FLAGYL) 500 MG tablet Take 1 tablet (500 mg total) by mouth 3 (three) times daily. Patient not taking: Reported on 06/04/2016 03/05/16   Fredia Sorrow, MD  predniSONE (DELTASONE) 10 MG tablet Take 4 tablets (40 mg total) by mouth daily. Patient not taking: Reported on 06/04/2016 03/05/16   Fredia Sorrow, MD  promethazine (PHENERGAN) 25 MG tablet Take 1 tablet (25 mg total) by mouth every 6 (six) hours as needed. Patient not taking: Reported on 06/04/2016 03/05/16   Fredia Sorrow, MD    Family History No family history on file.  Social History Social History  Substance Use Topics  . Smoking status: Current Every Day Smoker    Packs/day: 0.50  . Smokeless tobacco: Never Used  . Alcohol use Yes     Comment: occ     Allergies   Almond oil; Carrot [daucus carota]; Celery oil; Coconut flavor; and Macadamia nut oil   Review of Systems Review of Systems  Constitutional: Negative for appetite change and fatigue.  HENT: Negative for congestion, ear discharge and sinus pressure.   Eyes: Negative for discharge.  Respiratory: Negative for cough.   Cardiovascular: Negative for chest pain.  Gastrointestinal: Positive for abdominal pain. Negative for diarrhea.  Genitourinary: Negative for frequency and hematuria.  Musculoskeletal: Negative for back  pain.  Skin: Negative for rash.  Neurological: Negative for seizures and headaches.  Psychiatric/Behavioral: Negative for hallucinations.     Physical Exam Updated Vital Signs BP 109/76 (BP Location: Left Arm)   Pulse (!) 51   Temp 97.8 F (36.6 C) (Oral)   Resp 16   Ht 5\' 1"  (1.549 m)   Wt 95 lb (43.1 kg)   LMP 05/16/2016   SpO2 100%   BMI 17.95 kg/m   Physical Exam  Constitutional: She is oriented to person, place, and time. She appears well-developed.  HENT:  Head: Normocephalic.  Eyes: Conjunctivae and EOM are normal. No scleral icterus.  Neck: Neck supple. No thyromegaly present.  Cardiovascular: Normal rate  and regular rhythm.  Exam reveals no gallop and no friction rub.   No murmur heard. Pulmonary/Chest: No stridor. She has no wheezes. She has no rales. She exhibits no tenderness.  Abdominal: She exhibits no distension. There is tenderness. There is no rebound.  Mild left lower quadrant tenderness  Musculoskeletal: Normal range of motion. She exhibits no edema.  Lymphadenopathy:    She has no cervical adenopathy.  Neurological: She is oriented to person, place, and time. She exhibits normal muscle tone. Coordination normal.  Skin: No rash noted. No erythema.  Psychiatric: She has a normal mood and affect. Her behavior is normal.     ED Treatments / Results  Labs (all labs ordered are listed, but only abnormal results are displayed) Labs Reviewed  CBC WITH DIFFERENTIAL/PLATELET - Abnormal; Notable for the following:       Result Value   WBC 3.8 (*)    RBC 3.84 (*)    Hemoglobin 11.1 (*)    HCT 33.8 (*)    RDW 17.4 (*)    All other components within normal limits  COMPREHENSIVE METABOLIC PANEL - Abnormal; Notable for the following:    AST 43 (*)    All other components within normal limits  ETHANOL - Abnormal; Notable for the following:    Alcohol, Ethyl (B) 114 (*)    All other components within normal limits  URINALYSIS, ROUTINE W REFLEX MICROSCOPIC (NOT AT [facility])  I-STAT BETA HCG BLOOD, ED ([facility], [facility], [facility] ONLY)    EKG  EKG Interpretation None       Radiology Ct Abdomen Pelvis W Contrast  Result Date: 06/04/2016 CLINICAL DATA:  Left lower quadrant pain with nausea and diarrhea for 2 days. Cervical cancer. EXAM: CT ABDOMEN AND PELVIS WITH CONTRAST TECHNIQUE: Multidetector CT imaging of the abdomen and pelvis was performed using the standard protocol following bolus administration of intravenous contrast. CONTRAST:  42mL ISOVUE-300 IOPAMIDOL (ISOVUE-300) INJECTION 61%, 19mL ISOVUE-300 IOPAMIDOL (ISOVUE-300) INJECTION 61% COMPARISON:  03/05/2016 FINDINGS: Lower chest: Left base  scarring. Normal heart size without pericardial or pleural effusion. Hepatobiliary: Normal liver. Normal gallbladder, without biliary ductal dilatation. Pancreas: Normal, without mass or ductal dilatation. Spleen: Normal in size, without focal abnormality. Adrenals/Urinary Tract: Normal adrenal glands. Normal kidneys, without hydronephrosis. Normal urinary bladder. Stomach/Bowel: Proximal gastric underdistention. Apparent ascending colonic wall thickening is favored to be due to underdistention, including on image 40/series 2. Normal terminal ileum. Appendix not well visualized. No right lower quadrant inflammation identified. Normal small bowel. Vascular/Lymphatic: Normal caliber of the aorta and branch vessels. No abdominopelvic adenopathy. Reproductive: Fibroid uterus, including an anterior fundal lesion of 3.7 cm. No adnexal mass. Other: No significant free fluid. No free intraperitoneal air. Mild pelvic floor laxity. Musculoskeletal: Disc bulges at L4-5 and L5-S1. IMPRESSION: 1. Apparent ascending colonic  wall thickening is favored to be due to underdistention. Infectious colitis felt less likely. 2. No other explanation for patient's symptoms. 3. Fibroid uterus. 4. No abdominal pelvic adenopathy to suggest metastatic disease. Electronically Signed   By: Abigail Miyamoto M.D.   On: 06/04/2016 10:59    Procedures Procedures (including critical care time)  Medications Ordered in ED Medications  sodium chloride 0.9 % bolus 500 mL (0 mLs Intravenous Stopped 06/04/16 0832)  famotidine (PEPCID) IVPB 20 mg premix (0 mg Intravenous Stopped 06/04/16 0832)  iopamidol (ISOVUE-300) 61 % injection (30 mLs Oral Contrast Given 06/04/16 0830)  iopamidol (ISOVUE-300) 61 % injection 100 mL (100 mLs Intravenous Contrast Given 06/04/16 1033)  morphine 2 MG/ML injection 2 mg (2 mg Intravenous Given 06/04/16 1111)  ondansetron (ZOFRAN) injection 4 mg (4 mg Intravenous Given 06/04/16 1111)     Initial Impression / Assessment  and Plan / ED Course  I have reviewed the triage vital signs and the nursing notes.  Pertinent labs & imaging results that were available during my care of the patient were reviewed by me and considered in my medical decision making (see chart for details).  Clinical Course     Labs and CT unremarkable. Except for elevated. Patient will be put on Bentyl and will follow-up with GI  Final Clinical Impressions(s) / ED Diagnoses   Final diagnoses:  Lower abdominal pain    New Prescriptions New Prescriptions   DICYCLOMINE (BENTYL) 20 MG TABLET    Take one every 6 hours as needed for abdominal pain     Milton Ferguson, MD 06/04/16 1133

## 2016-06-04 NOTE — ED Triage Notes (Signed)
Pt comes in with LLQ pain starting 2 days ago. Pt has nausea and diarrhea present; denies emesis. States that since having this pain she has had pain in her left upper leg/hip area. Worsens with movement.

## 2016-06-04 NOTE — Discharge Instructions (Signed)
Follow-up with Dr. Oneida Alar in 1-2 weeks for recheck

## 2016-06-04 NOTE — ED Notes (Signed)
Pt taken to ct 

## 2016-06-14 ENCOUNTER — Encounter (HOSPITAL_COMMUNITY): Payer: Self-pay | Admitting: Emergency Medicine

## 2016-06-14 ENCOUNTER — Emergency Department (HOSPITAL_COMMUNITY)
Admission: EM | Admit: 2016-06-14 | Discharge: 2016-06-14 | Disposition: A | Discharge status: Home / Self Care | Payer: Self-pay | Attending: Emergency Medicine | Admitting: Emergency Medicine

## 2016-06-14 DIAGNOSIS — Z7982 Long term (current) use of aspirin: Secondary | ICD-10-CM | POA: Insufficient documentation

## 2016-06-14 DIAGNOSIS — H16001 Unspecified corneal ulcer, right eye: Secondary | ICD-10-CM | POA: Insufficient documentation

## 2016-06-14 DIAGNOSIS — Z8541 Personal history of malignant neoplasm of cervix uteri: Secondary | ICD-10-CM | POA: Insufficient documentation

## 2016-06-14 DIAGNOSIS — J45909 Unspecified asthma, uncomplicated: Secondary | ICD-10-CM | POA: Insufficient documentation

## 2016-06-14 DIAGNOSIS — F17200 Nicotine dependence, unspecified, uncomplicated: Secondary | ICD-10-CM | POA: Insufficient documentation

## 2016-06-14 DIAGNOSIS — Z79899 Other long term (current) drug therapy: Secondary | ICD-10-CM | POA: Insufficient documentation

## 2016-06-14 MED ORDER — ERYTHROMYCIN 5 MG/GM OP OINT: TOPICAL_OINTMENT | OPHTHALMIC | 0 refills | Status: DC

## 2016-06-14 MED ORDER — FLUORESCEIN SODIUM 1 MG OP STRP
1.0000 | ORAL_STRIP | Freq: Once | OPHTHALMIC | Status: AC
  Administered 2016-06-14: 1 via OPHTHALMIC
  Filled 2016-06-14: qty 1

## 2016-06-14 MED ORDER — TETRACAINE HCL 0.5 % OP SOLN
1.0000 [drp] | Freq: Once | OPHTHALMIC | Status: AC
  Administered 2016-06-14: 1 [drp] via OPHTHALMIC
  Filled 2016-06-14: qty 4

## 2016-06-14 NOTE — ED Triage Notes (Signed)
Pt states she got rubbing alcohol in her right eye 3 days ago and it has been hurting since.  States her vision is blurred and has been waking with crust around eye.

## 2016-06-14 NOTE — Discharge Instructions (Signed)
Please read and follow all provided instructions.  Your diagnoses today include:  1. Ulcer of right cornea     Tests performed today include: Vital signs. See below for your results today.   Medications prescribed:  Take as prescribed   Home care instructions:  Follow any educational materials contained in this packet.  Follow-up instructions: Please follow-up with your Opthomology for further evaluation of symptoms and treatment   YOUR APPOINTMENT IS AT 3:30pm with DR. Talbert Forest. GO TO ADDRESS ABOVE. IT'S IN [address]  Return instructions:  Please return to the Emergency Department if you do not get better, if you get worse, or new symptoms OR  - Fever (temperature greater than 101.57F)  - Bleeding that does not stop with holding pressure to the area    -Severe pain (please note that you may be more sore the day after your accident)  - Chest Pain  - Difficulty breathing  - Severe nausea or vomiting  - Inability to tolerate food and liquids  - Passing out  - Skin becoming red around your wounds  - Change in mental status (confusion or lethargy)  - New numbness or weakness    Please return if you have any other emergent concerns.  Additional Information:  Your vital signs today were: BP 112/80 (BP Location: Left Arm)    Pulse 62    Temp 98.2 F (36.8 C) (Oral)    Resp 18    Ht 5\' 1"  (1.549 m)    Wt 42.6 kg    LMP 06/14/2016    SpO2 99%    BMI 17.76 kg/m  If your blood pressure (BP) was elevated above 135/85 this visit, please have this repeated by your doctor within one month. ---------------

## 2016-06-14 NOTE — ED Provider Notes (Signed)
[facility] DEPT Provider Note   CSN: [id number] Arrival date & time: 06/14/16  [id number]     History   Chief Complaint Chief Complaint  Patient presents with  . Eye Problem    HPI Marissa Frank is a 45 y.o. female.  HPI  45 y.o. female, presents to the Emergency Department today complaining of right eye pain x 3 days. Notes using rubbing alcohol solution on her face when some of it splashed in her right eye. Notes decrease in vision as well as burning/pressure sensation in eye. Notes pain 7/10. Attempted sterile saline drops as well as flushing eye initially with minimal relief. Notes crusting in AM. No fevers. No N/V. No headaches. No other symptoms noted.   Past Medical History:  Diagnosis Date  . Asthma   . Cancer ([place])    cervical    There are no active problems to display for this patient.   Past Surgical History:  Procedure Laterality Date  . BREAST SURGERY    . CERVICAL CONE BIOPSY    . TUBAL LIGATION      OB History    No data available       Home Medications    Prior to Admission medications   Medication Sig Start Date End Date Taking? Authorizing Provider  aspirin 325 MG tablet Take 325 mg by mouth daily.    Historical Provider, MD  ciprofloxacin (CIPRO) 500 MG tablet Take 1 tablet (500 mg total) by mouth 2 (two) times daily. Patient not taking: Reported on 06/04/2016 03/05/16   Fredia Sorrow, MD  Cyanocobalamin (VITAMIN B-12 PO) Take 1 tablet by mouth daily.    Historical Provider, MD  dicyclomine (BENTYL) 20 MG tablet Take one every 6 hours as needed for abdominal pain 06/04/16   Milton Ferguson, MD  ferrous sulfate 325 (65 FE) MG tablet Take 325 mg by mouth daily with breakfast.     Historical Provider, MD  HYDROcodone-acetaminophen (NORCO/VICODIN) 5-325 MG tablet Take 1-2 tablets by mouth every 6 (six) hours as needed. 03/05/16   Fredia Sorrow, MD  metroNIDAZOLE (FLAGYL) 500 MG tablet Take 1 tablet (500 mg total) by mouth 3 (three) times  daily. Patient not taking: Reported on 06/04/2016 03/05/16   Fredia Sorrow, MD  predniSONE (DELTASONE) 10 MG tablet Take 4 tablets (40 mg total) by mouth daily. Patient not taking: Reported on 06/04/2016 03/05/16   Fredia Sorrow, MD  promethazine (PHENERGAN) 25 MG tablet Take 1 tablet (25 mg total) by mouth every 6 (six) hours as needed. Patient not taking: Reported on 06/04/2016 03/05/16   Fredia Sorrow, MD    Family History History reviewed. No pertinent family history.  Social History Social History  Substance Use Topics  . Smoking status: Current Every Day Smoker    Packs/day: 0.50  . Smokeless tobacco: Never Used  . Alcohol use Yes     Comment: occ     Allergies   Almond oil; Carrot [daucus carota]; Celery oil; Coconut flavor; and Macadamia nut oil   Review of Systems Review of Systems  Constitutional: Negative for fever.  Eyes: Positive for pain and visual disturbance.  Gastrointestinal: Negative for nausea and vomiting.  Skin: Negative for wound.   Physical Exam Updated Vital Signs BP 112/80 (BP Location: Left Arm)   Pulse 62   Temp 98.2 F (36.8 C) (Oral)   Resp 18   Ht 5\' 1"  (1.549 m)   Wt 42.6 kg   LMP 06/14/2016   SpO2 99%   BMI 17.76  kg/m   Physical Exam  Constitutional: She is oriented to person, place, and time. Vital signs are normal. She appears well-developed and well-nourished.  HENT:  Head: Normocephalic.  Right Ear: Hearing normal.  Left Ear: Hearing normal.  Eyes: Conjunctivae and EOM are normal. Pupils are equal, round, and reactive to light. Right eye exhibits no chemosis and no discharge. Left eye exhibits no chemosis and no discharge. Right conjunctiva is not injected. Left conjunctiva is not injected. Right eye exhibits normal extraocular motion.  Neck: Normal range of motion. Neck supple.  Cardiovascular: Normal rate, regular rhythm, normal heart sounds and intact distal pulses.   Pulmonary/Chest: Effort normal and breath sounds normal.   Neurological: She is alert and oriented to person, place, and time.  Skin: Skin is warm and dry.  Psychiatric: She has a normal mood and affect. Her speech is normal and behavior is normal. Thought content normal.  Nursing note and vitals reviewed.  8:14 AM Two drops of tetracaine/proparacaine instilled into affected eye.   Fluorescein strip applied to affected eye. Wood's lamp used to assess for corneal abrasion. Showed Ulcer at 5 o clock  Right Eye pH- 7  Patient tolerated procedure well without immediate complication.   ED Treatments / Results  Labs (all labs ordered are listed, but only abnormal results are displayed) Labs Reviewed - No data to display  EKG  EKG Interpretation None       Radiology No results found.  Procedures Procedures (including critical care time)  Medications Ordered in ED Medications - No data to display   Initial Impression / Assessment and Plan / ED Course  I have reviewed the triage vital signs and the nursing notes.  Pertinent labs & imaging results that were available during my care of the patient were reviewed by me and considered in my medical decision making (see chart for details).  Clinical Course    Final Clinical Impressions(s) / ED Diagnoses  I have reviewed the relevant previous healthcare records. I obtained HPI from historian.  ED Course:  Assessment: Pt is a 45yF who presents with right eye pain s/p rubbing alcohol contact x 3 days ago. Notes crusting on AM and decrease in vision as well as burning sensation. Attempted flushing and sterile saline drops. On exam, pt in NAD. Nontoxic/nonseptic appearing. VSS. Afebrile. Right eye with no visible abnormalities on exam. PH right eye- 7. Ulcer noted at 5 o clock on fluoro. Plan is to DC with eye ABX and follow up to Optho. Consult to Dr. Talbert Forest will follow up tomorrow. Plan is to [facility]. At time of discharge, Patient is in no acute distress. Vital Signs are stable. Patient is able  to ambulate. Patient able to tolerate PO.   Disposition/Plan:  DC Home Additional Verbal discharge instructions given and discussed with patient.  Pt Instructed to f/u with Optho in the next week for evaluation and treatment of symptoms. Return precautions given Pt acknowledges and agrees with plan  Supervising Physician Francine Graven, DO   Final diagnoses:  Ulcer of right cornea    New Prescriptions New Prescriptions   No medications on file     Shary Decamp, PA-C 06/14/16 [place], DO 06/16/16 2017

## 2016-07-14 ENCOUNTER — Emergency Department (HOSPITAL_COMMUNITY)
Admission: EM | Admit: 2016-07-14 | Discharge: 2016-07-14 | Disposition: A | Discharge status: Home / Self Care | Payer: Self-pay | Attending: Emergency Medicine | Admitting: Emergency Medicine

## 2016-07-14 ENCOUNTER — Encounter (HOSPITAL_COMMUNITY): Payer: Self-pay | Admitting: Emergency Medicine

## 2016-07-14 ENCOUNTER — Emergency Department (HOSPITAL_COMMUNITY): Payer: Self-pay

## 2016-07-14 DIAGNOSIS — Z8541 Personal history of malignant neoplasm of cervix uteri: Secondary | ICD-10-CM | POA: Insufficient documentation

## 2016-07-14 DIAGNOSIS — Z7982 Long term (current) use of aspirin: Secondary | ICD-10-CM | POA: Insufficient documentation

## 2016-07-14 DIAGNOSIS — Z79899 Other long term (current) drug therapy: Secondary | ICD-10-CM | POA: Insufficient documentation

## 2016-07-14 DIAGNOSIS — J45901 Unspecified asthma with (acute) exacerbation: Secondary | ICD-10-CM | POA: Insufficient documentation

## 2016-07-14 DIAGNOSIS — H539 Unspecified visual disturbance: Secondary | ICD-10-CM | POA: Insufficient documentation

## 2016-07-14 DIAGNOSIS — F17200 Nicotine dependence, unspecified, uncomplicated: Secondary | ICD-10-CM | POA: Insufficient documentation

## 2016-07-14 MED ORDER — PREDNISONE 20 MG PO TABS: 40.0000 mg | ORAL_TABLET | Freq: Every day | ORAL | 0 refills | Status: DC

## 2016-07-14 MED ORDER — ALBUTEROL SULFATE (2.5 MG/3ML) 0.083% IN NEBU
2.5000 mg | INHALATION_SOLUTION | Freq: Once | RESPIRATORY_TRACT | Status: AC
  Administered 2016-07-14: 2.5 mg via RESPIRATORY_TRACT

## 2016-07-14 MED ORDER — ALBUTEROL SULFATE (2.5 MG/3ML) 0.083% IN NEBU: 2.5000 mg | INHALATION_SOLUTION | Freq: Four times a day (QID) | RESPIRATORY_TRACT | 12 refills | Status: DC | PRN

## 2016-07-14 MED ORDER — IPRATROPIUM-ALBUTEROL 0.5-2.5 (3) MG/3ML IN SOLN
3.0000 mL | Freq: Once | RESPIRATORY_TRACT | Status: AC
  Administered 2016-07-14: 3 mL via RESPIRATORY_TRACT

## 2016-07-14 MED ORDER — IPRATROPIUM BROMIDE 0.02 % IN SOLN: 0.5000 mg | RESPIRATORY_TRACT | Status: DC

## 2016-07-14 MED ORDER — PREDNISONE 20 MG PO TABS
40.0000 mg | ORAL_TABLET | Freq: Once | ORAL | Status: AC
Start: 2016-07-14 — End: 2016-07-14
  Administered 2016-07-14: 40 mg via ORAL
  Filled 2016-07-14: qty 2

## 2016-07-14 MED ORDER — IPRATROPIUM-ALBUTEROL 0.5-2.5 (3) MG/3ML IN SOLN
RESPIRATORY_TRACT | Status: AC
  Filled 2016-07-14: qty 3

## 2016-07-14 MED ORDER — ALBUTEROL SULFATE (2.5 MG/3ML) 0.083% IN NEBU
INHALATION_SOLUTION | RESPIRATORY_TRACT | Status: AC
  Filled 2016-07-14: qty 3

## 2016-07-14 MED ORDER — ALBUTEROL SULFATE (2.5 MG/3ML) 0.083% IN NEBU: 5.0000 mg | INHALATION_SOLUTION | Freq: Once | RESPIRATORY_TRACT | Status: DC

## 2016-07-14 NOTE — ED Notes (Signed)
Pt states she is feeling dizzy.

## 2016-07-14 NOTE — ED Triage Notes (Signed)
Pt c/o asthma flare and chest pain when she coughs. Onset of symptoms 1 week ago.

## 2016-07-14 NOTE — Discharge Instructions (Signed)
Albuterol every 4 hours as needed Prednisone once a day Robitussin or Delsym for coughing See your doctor in 3 days for recheck  [facility] Primary Care Doctor List    Sinda Du MD. Specialty: Pulmonary Disease Contact information: [address]  [place] [address]  [phone]   Tula Nakayama, MD. Specialty: [facility] Medicine Contact information: [address], [address] [place] [address]  [phone]   Sallee Lange, MD. Specialty: [facility] Medicine Contact information: [address]  [place] [address]  [phone]   Rosita Fire, MD Specialty: Internal Medicine Contact information: [place] [address]  [phone]   Delphina Cahill, MD. Specialty: Internal Medicine Contact information: [place] [address]  [phone]   Marjean Donna, MD. Specialty: Family Medicine Contact information: [place] [address]  [phone]   Leslie Andrea, MD. Specialty: [facility] Medicine Contact information: [place] [address]  [phone]   Asencion Noble, MD. Specialty: Internal Medicine Contact information: [place]  [place] [address]  [phone]

## 2016-07-14 NOTE — ED Provider Notes (Signed)
Marissa Frank Provider Note   CSN: [id number] Arrival date & time: 07/14/16  1104  By signing my name below, I, Marissa Frank, attest that this documentation has been prepared under the direction and in the presence of Marissa Chapel, MD. Electronically Signed: Sonum Frank, Education administrator. 07/14/16. 12:40 PM.  History   Chief Complaint Chief Complaint  Patient presents with  . Asthma   The history is provided by the patient. No language interpreter was used.    HPI Comments: Marissa Frank is a 45 y.o. female with past medical history of asthma who presents to the Emergency Department complaining of a persistent cough with associated SOB and CP for the past week. She states the symptoms initially began with a sore throat and rhinorrhea. She reports associated subjective fever and chills, decreased appetite, blurry vision, and occasional dizziness. She states her cough is worse at night. She states she usually has an inhaler for her asthma but states she has run out. She is a daily smoker. She denies diarrhea, vomiting, rash.   Past Medical History:  Diagnosis Date  . Asthma   . Cancer ([place])    cervical    There are no active problems to display for this patient.   Past Surgical History:  Procedure Laterality Date  . BREAST SURGERY    . CERVICAL CONE BIOPSY    . TUBAL LIGATION      OB History    Gravida Para Term Preterm AB Living   3 2 2   1      SAB TAB Ectopic Multiple Live Births     1             Home Medications    Prior to Admission medications   Medication Sig Start Date End Date Taking? Authorizing Provider  aspirin 325 MG tablet Take 325 mg by mouth daily.   Yes Historical Provider, MD  Cyanocobalamin (VITAMIN B-12 PO) Take 1 tablet by mouth daily.   Yes Historical Provider, MD  HYDROcodone-acetaminophen (NORCO/VICODIN) 5-325 MG tablet Take 1-2 tablets by mouth every 6 (six) hours as needed. 03/05/16  Yes Fredia Sorrow, MD  albuterol (PROVENTIL) (2.5  MG/3ML) 0.083% nebulizer solution Take 3 mLs (2.5 mg total) by nebulization every 6 (six) hours as needed for wheezing or shortness of breath. 07/14/16   Marissa Chapel, MD  predniSONE (DELTASONE) 20 MG tablet Take 2 tablets (40 mg total) by mouth daily. 07/14/16   Marissa Chapel, MD    Family History Family History  Problem Relation Age of Onset  . Hypertension Mother     Social History Social History  Substance Use Topics  . Smoking status: Current Every Day Smoker    Packs/day: 0.50  . Smokeless tobacco: Never Used  . Alcohol use Yes     Comment: occ     Allergies   Almond oil; Carrot [daucus carota]; Celery oil; Coconut flavor; and Macadamia nut oil   Review of Systems Review of Systems  Constitutional: Positive for appetite change (decreased), chills and fever (subjective).  HENT: Positive for rhinorrhea and sore throat.   Eyes: Positive for visual disturbance (blurry).  Gastrointestinal: Negative for diarrhea and vomiting.  Skin: Negative for rash.  Neurological: Positive for dizziness.     Physical Exam Updated Vital Signs BP 120/65 (BP Location: Right Arm)   Pulse 86   Temp 99.5 F (37.5 C) (Oral)   Resp 16   Ht 5\' 1"  (1.549 m)   Wt 94 lb (42.6 kg)   LMP  06/14/2016   SpO2 100%   BMI 17.76 kg/m   Physical Exam  Constitutional: She appears well-developed and well-nourished. No distress.  HENT:  Head: Normocephalic and atraumatic.  Mouth/Throat: Oropharynx is clear and moist. No oropharyngeal exudate.  Eyes: Conjunctivae and EOM are normal. Pupils are equal, round, and reactive to light. Right eye exhibits no discharge. Left eye exhibits no discharge. No scleral icterus.  Neck: Normal range of motion. Neck supple. No JVD present. No thyromegaly present.  Cardiovascular: Normal rate, regular rhythm, normal heart sounds and intact distal pulses.  Exam reveals no gallop and no friction rub.   No murmur heard. Pulmonary/Chest: Effort normal. No respiratory  distress. She has wheezes. She has no rhonchi. She has no rales.  No rales, no rhonchi, speaks in full sentences. Respirations are unlabored expiratory wheezing bilaterally.   Abdominal: Soft. Bowel sounds are normal. She exhibits no distension and no mass. There is no tenderness.  Musculoskeletal: Normal range of motion. She exhibits no edema or tenderness.  No edema in the legs  Lymphadenopathy:    She has no cervical adenopathy.  Neurological: She is alert. Coordination normal.  Skin: Skin is warm and dry. No rash noted. No erythema.  Psychiatric: She has a normal mood and affect. Her behavior is normal.  Nursing note and vitals reviewed.   ED Treatments / Results  DIAGNOSTIC STUDIES: Oxygen Saturation is 100% on RA, normal by my interpretation.    COORDINATION OF CARE: 12:40 PM Discussed treatment plan with pt at bedside and pt agreed to plan.   Labs (all labs ordered are listed, but only abnormal results are displayed) Labs Reviewed - No data to display   Radiology Dg Chest 2 View  Result Date: 07/14/2016 CLINICAL DATA:  Cough.  Chest pain. EXAM: CHEST  2 VIEW COMPARISON:  11/09/2015. FINDINGS: Normal sized heart. Clear lungs. Mild diffuse peribronchial thickening. Unremarkable bones. IMPRESSION: Mild bronchitic changes. Electronically Signed   By: Claudie Revering M.D.   On: 07/14/2016 11:37    Procedures Procedures (including critical care time)  Medications Ordered in ED Medications  predniSONE (DELTASONE) tablet 40 mg (40 mg Oral Given 07/14/16 1250)  albuterol (PROVENTIL) (2.5 MG/3ML) 0.083% nebulizer solution 2.5 mg (2.5 mg Nebulization Given 07/14/16 1305)  ipratropium-albuterol (DUONEB) 0.5-2.5 (3) MG/3ML nebulizer solution 3 mL (3 mLs Nebulization Given 07/14/16 1305)     Initial Impression / Assessment and Plan / ED Course  I have reviewed the triage vital signs and the nursing notes.  Pertinent labs & imaging results that were available during my care of the  patient were reviewed by me and considered in my medical decision making (see chart for details).  Clinical Course    Improved with neb, much better appearing Xray without acute findings Pt informed Home with albuterol prednisone  Final Clinical Impressions(s) / ED Diagnoses   Final diagnoses:  Moderate asthma with exacerbation, unspecified whether persistent    New Prescriptions New Prescriptions   ALBUTEROL (PROVENTIL) (2.5 MG/3ML) 0.083% NEBULIZER SOLUTION    Take 3 mLs (2.5 mg total) by nebulization every 6 (six) hours as needed for wheezing or shortness of breath.   PREDNISONE (DELTASONE) 20 MG TABLET    Take 2 tablets (40 mg total) by mouth daily.   I personally performed the services described in this documentation, which was scribed in my presence. The recorded information has been reviewed and is accurate.        Marissa Chapel, MD 07/14/16 1520

## 2016-10-01 ENCOUNTER — Encounter (HOSPITAL_COMMUNITY): Payer: Self-pay | Admitting: Emergency Medicine

## 2016-10-01 ENCOUNTER — Emergency Department (HOSPITAL_COMMUNITY)
Admission: EM | Admit: 2016-10-01 | Discharge: 2016-10-01 | Disposition: A | Discharge status: Home / Self Care | Payer: Self-pay | Attending: Emergency Medicine | Admitting: Emergency Medicine

## 2016-10-01 DIAGNOSIS — J45909 Unspecified asthma, uncomplicated: Secondary | ICD-10-CM | POA: Insufficient documentation

## 2016-10-01 DIAGNOSIS — M5431 Sciatica, right side: Secondary | ICD-10-CM

## 2016-10-01 DIAGNOSIS — M5441 Lumbago with sciatica, right side: Secondary | ICD-10-CM | POA: Insufficient documentation

## 2016-10-01 DIAGNOSIS — Z7982 Long term (current) use of aspirin: Secondary | ICD-10-CM | POA: Insufficient documentation

## 2016-10-01 DIAGNOSIS — F17200 Nicotine dependence, unspecified, uncomplicated: Secondary | ICD-10-CM | POA: Insufficient documentation

## 2016-10-01 MED ORDER — KETOROLAC TROMETHAMINE 60 MG/2ML IM SOLN
60.0000 mg | Freq: Once | INTRAMUSCULAR | Status: AC
  Administered 2016-10-01: 60 mg via INTRAMUSCULAR
  Filled 2016-10-01: qty 2

## 2016-10-01 MED ORDER — METHOCARBAMOL 500 MG PO TABS: 1000.0000 mg | ORAL_TABLET | Freq: Four times a day (QID) | ORAL | 0 refills | Status: DC | PRN

## 2016-10-01 MED ORDER — METHOCARBAMOL 500 MG PO TABS
1000.0000 mg | ORAL_TABLET | Freq: Once | ORAL | Status: AC
Start: 2016-10-01 — End: 2016-10-01
  Administered 2016-10-01: 1000 mg via ORAL
  Filled 2016-10-01: qty 2

## 2016-10-01 MED ORDER — IBUPROFEN 600 MG PO TABS: 600.0000 mg | ORAL_TABLET | Freq: Four times a day (QID) | ORAL | 0 refills | Status: DC | PRN

## 2016-10-01 NOTE — ED Triage Notes (Signed)
Pt reports low back pain starting last week while at work with no injury.  Radiates down right leg.

## 2016-10-01 NOTE — ED Provider Notes (Signed)
[facility] DEPT Provider Note   CSN: [id number] Arrival date & time: 10/01/16  [id number]  By signing my name below, I, [name], attest that this documentation has been prepared under the direction and in the presence of Julianne Rice, MD. Electronically Signed: [name], Education administrator. 10/01/16. 9:16 AM.  History   Chief Complaint Chief Complaint  Patient presents with  . Back Pain    The history is provided by the patient. No language interpreter was used.     HPI Comments: Marissa Frank is a 46 y.o. female who presents to the Emergency Department complaining of constant, unchanged lower back pain that began 1 week ago after an injury. She states she was at work when she felt a pop in her lower back. She notes associated intermittent radiation down the right leg. She has tried a heating pad without relief. She denies bowel/bladder incontinence, dysuria, weakness.   Past Medical History:  Diagnosis Date  . Asthma   . Cancer ([place])    cervical    There are no active problems to display for this patient.   Past Surgical History:  Procedure Laterality Date  . BREAST SURGERY    . CERVICAL CONE BIOPSY    . TUBAL LIGATION      OB History    Gravida Para Term Preterm AB Living   3 2 2   1      SAB TAB Ectopic Multiple Live Births     1             Home Medications    Prior to Admission medications   Medication Sig Start Date End Date Taking? Authorizing Provider  albuterol (PROVENTIL) (2.5 MG/3ML) 0.083% nebulizer solution Take 3 mLs (2.5 mg total) by nebulization every 6 (six) hours as needed for wheezing or shortness of breath. 07/14/16   [name], MD  aspirin 325 MG tablet Take 325 mg by mouth daily.    Historical Provider, MD  Cyanocobalamin (VITAMIN B-12 PO) Take 1 tablet by mouth daily.    Historical Provider, MD  HYDROcodone-acetaminophen (NORCO/VICODIN) 5-325 MG tablet Take 1-2 tablets by mouth every 6 (six) hours as needed. 03/05/16   Fredia Sorrow,  MD  ibuprofen (ADVIL,MOTRIN) 600 MG tablet Take 1 tablet (600 mg total) by mouth every 6 (six) hours as needed. 10/01/16   Julianne Rice, MD  methocarbamol (ROBAXIN) 500 MG tablet Take 2 tablets (1,000 mg total) by mouth every 6 (six) hours as needed for muscle spasms. 10/01/16   Julianne Rice, MD  predniSONE (DELTASONE) 20 MG tablet Take 2 tablets (40 mg total) by mouth daily. 07/14/16   [name], MD    Family History Family History  Problem Relation Age of Onset  . Hypertension Mother     Social History Social History  Substance Use Topics  . Smoking status: Current Every Day Smoker    Packs/day: 0.50  . Smokeless tobacco: Never Used  . Alcohol use Yes     Comment: occ     Allergies   Almond oil; Carrot [daucus carota]; Celery oil; Coconut flavor; and Macadamia nut oil   Review of Systems Review of Systems  Constitutional: Negative for chills and fever.  Respiratory: Negative for shortness of breath.   Cardiovascular: Negative for chest pain.  Gastrointestinal: Negative for abdominal pain, nausea and vomiting.  Genitourinary: Negative for difficulty urinating, dysuria and flank pain.  Musculoskeletal: Positive for back pain and myalgias. Negative for arthralgias, neck pain and neck stiffness.  Skin: Negative for rash  and wound.  Neurological: Negative for dizziness, weakness, numbness and headaches.  All other systems reviewed and are negative.    Physical Exam Updated Vital Signs BP 126/80 (BP Location: Right Arm)   Pulse 67   Temp 98.1 F (36.7 C) (Oral)   Resp 17   LMP 09/03/2016   SpO2 100%   Physical Exam  Constitutional: She is oriented to person, place, and time. She appears well-developed and well-nourished.  HENT:  Head: Normocephalic and atraumatic.  Mouth/Throat: Oropharynx is clear and moist.  Eyes: EOM are normal. Pupils are equal, round, and reactive to light.  Neck: Normal range of motion. Neck supple.  Cardiovascular: Normal rate and  regular rhythm.   Pulmonary/Chest: Effort normal and breath sounds normal.  Abdominal: Soft. Bowel sounds are normal. There is no tenderness. There is no rebound and no guarding.  Musculoskeletal: Normal range of motion. She exhibits tenderness (midline lumbar tenderness to palpation and right-sided paraspinal muscular tenderness. Negative straight leg raise bilaterally.). She exhibits no edema.  No step-off deformity. No CVA tenderness bilaterally. Distal pulses are 2+  Neurological: She is alert and oriented to person, place, and time.  Patient is alert and oriented x3 with clear, goal oriented speech. Patient has 5/5 motor in all extremities. Sensation is intact to light touch. Patient has a normal gait and walks without assistance.  Skin: Skin is warm and dry. Capillary refill takes less than 2 seconds. No rash noted. No erythema.  Psychiatric: She has a normal mood and affect. Her behavior is normal.  Nursing note and vitals reviewed.    ED Treatments / Results  DIAGNOSTIC STUDIES: Oxygen Saturation is 100% on RA, normal by my interpretation.    COORDINATION OF CARE: 9:17 AM Discussed treatment plan with pt at bedside and pt agreed to plan.   Labs (all labs ordered are listed, but only abnormal results are displayed) Labs Reviewed - No data to display  EKG  EKG Interpretation None       Radiology No results found.  Procedures Procedures (including critical care time)  Medications Ordered in ED Medications  ketorolac (TORADOL) injection 60 mg (not administered)  methocarbamol (ROBAXIN) tablet 1,000 mg (not administered)     Initial Impression / Assessment and Plan / ED Course  I have reviewed the triage vital signs and the nursing notes.  Pertinent labs & imaging results that were available during my care of the patient were reviewed by me and considered in my medical decision making (see chart for details).     I personally performed the services described in  this documentation, which was scribed in my presence. The recorded information has been reviewed and is accurate.   No red flag signs or symptoms. We'll treat conservatively and advised follow-up with spinal surgery should symptoms persist. Return precautions given.  Final Clinical Impressions(s) / ED Diagnoses   Final diagnoses:  Sciatica of right side    New Prescriptions New Prescriptions   IBUPROFEN (ADVIL,MOTRIN) 600 MG TABLET    Take 1 tablet (600 mg total) by mouth every 6 (six) hours as needed.   METHOCARBAMOL (ROBAXIN) 500 MG TABLET    Take 2 tablets (1,000 mg total) by mouth every 6 (six) hours as needed for muscle spasms.      Julianne Rice, MD 10/01/16 [phone]

## 2016-10-16 ENCOUNTER — Encounter (HOSPITAL_COMMUNITY): Payer: Self-pay | Admitting: Emergency Medicine

## 2016-10-16 ENCOUNTER — Emergency Department (HOSPITAL_COMMUNITY)
Admission: EM | Admit: 2016-10-16 | Discharge: 2016-10-16 | Disposition: A | Discharge status: Home / Self Care | Payer: BLUE CROSS/BLUE SHIELD | Attending: Emergency Medicine | Admitting: Emergency Medicine

## 2016-10-16 DIAGNOSIS — F17200 Nicotine dependence, unspecified, uncomplicated: Secondary | ICD-10-CM | POA: Insufficient documentation

## 2016-10-16 DIAGNOSIS — Z79899 Other long term (current) drug therapy: Secondary | ICD-10-CM | POA: Diagnosis not present

## 2016-10-16 DIAGNOSIS — Z791 Long term (current) use of non-steroidal anti-inflammatories (NSAID): Secondary | ICD-10-CM | POA: Diagnosis not present

## 2016-10-16 DIAGNOSIS — Z8541 Personal history of malignant neoplasm of cervix uteri: Secondary | ICD-10-CM | POA: Diagnosis not present

## 2016-10-16 DIAGNOSIS — Z7982 Long term (current) use of aspirin: Secondary | ICD-10-CM | POA: Diagnosis not present

## 2016-10-16 DIAGNOSIS — K625 Hemorrhage of anus and rectum: Secondary | ICD-10-CM | POA: Diagnosis not present

## 2016-10-16 DIAGNOSIS — J45909 Unspecified asthma, uncomplicated: Secondary | ICD-10-CM | POA: Diagnosis not present

## 2016-10-16 LAB — COMPREHENSIVE METABOLIC PANEL
ALT: 24 U/L (ref 14–54)
ANION GAP: 7 (ref 5–15)
AST: 58 U/L — ABNORMAL HIGH (ref 15–41)
Albumin: 3.9 g/dL (ref 3.5–5.0)
Alkaline Phosphatase: 74 U/L (ref 38–126)
BUN: 7 mg/dL (ref 6–20)
CALCIUM: 9 mg/dL (ref 8.9–10.3)
CO2: 26 mmol/L (ref 22–32)
Chloride: 102 mmol/L (ref 101–111)
Creatinine, Ser: 0.63 mg/dL (ref 0.44–1.00)
Glucose, Bld: 103 mg/dL — ABNORMAL HIGH (ref 65–99)
Potassium: 3.9 mmol/L (ref 3.5–5.1)
SODIUM: 135 mmol/L (ref 135–145)
Total Bilirubin: 0.6 mg/dL (ref 0.3–1.2)
Total Protein: 7.2 g/dL (ref 6.5–8.1)

## 2016-10-16 LAB — TYPE AND SCREEN
ABO/RH(D): O POS
Antibody Screen: NEGATIVE

## 2016-10-16 LAB — CBC
HCT: 33.6 % — ABNORMAL LOW (ref 36.0–46.0)
HEMOGLOBIN: 11.3 g/dL — AB (ref 12.0–15.0)
MCH: 28.9 pg (ref 26.0–34.0)
MCHC: 33.6 g/dL (ref 30.0–36.0)
MCV: 85.9 fL (ref 78.0–100.0)
PLATELETS: 286 10*3/uL (ref 150–400)
RBC: 3.91 MIL/uL (ref 3.87–5.11)
RDW: 19 % — ABNORMAL HIGH (ref 11.5–15.5)
WBC: 3.6 10*3/uL — AB (ref 4.0–10.5)

## 2016-10-16 LAB — POC OCCULT BLOOD, ED: FECAL OCCULT BLD: NEGATIVE

## 2016-10-16 MED ORDER — SODIUM CHLORIDE 0.9 % IV BOLUS (SEPSIS)
1000.0000 mL | Freq: Once | INTRAVENOUS | Status: AC
  Administered 2016-10-16: 1000 mL via INTRAVENOUS

## 2016-10-16 MED ORDER — HYDROCORTISONE ACETATE 25 MG RE SUPP: 25.0000 mg | Freq: Two times a day (BID) | RECTAL | 0 refills | Status: DC

## 2016-10-16 NOTE — Discharge Instructions (Signed)
Stop ibuprofen

## 2016-10-16 NOTE — ED Provider Notes (Signed)
[facility] DEPT Provider Note   CSN: [id number] Arrival date & time: 10/16/16  1105     History   Chief Complaint Chief Complaint  Patient presents with  . Rectal Bleeding    HPI Marissa Frank is a 46 y.o. female.  Pt presents to the ED today with rectal bleeding.  Pt was in the ED on 3/5 for back pain.  She has been on ibuprofen and a muscle relaxer.  She had a large bowel movement today that was black and bloody.  She was concerned, so she came in.  She still has the back pain.      Past Medical History:  Diagnosis Date  . Asthma   . Cancer ([place])    cervical    There are no active problems to display for this patient.   Past Surgical History:  Procedure Laterality Date  . BREAST SURGERY    . CERVICAL CONE BIOPSY    . TUBAL LIGATION      OB History    Gravida Para Term Preterm AB Living   3 2 2   1      SAB TAB Ectopic Multiple Live Births     1             Home Medications    Prior to Admission medications   Medication Sig Start Date End Date Taking? Authorizing Provider  albuterol (PROVENTIL) (2.5 MG/3ML) 0.083% nebulizer solution Take 3 mLs (2.5 mg total) by nebulization every 6 (six) hours as needed for wheezing or shortness of breath. 07/14/16   [name], MD  aspirin 325 MG tablet Take 325 mg by mouth daily.    Historical Provider, MD  Cyanocobalamin (VITAMIN B-12 PO) Take 1 tablet by mouth daily.    Historical Provider, MD  HYDROcodone-acetaminophen (NORCO/VICODIN) 5-325 MG tablet Take 1-2 tablets by mouth every 6 (six) hours as needed. 03/05/16   Fredia Sorrow, MD  hydrocortisone (ANUSOL-HC) 25 MG suppository Place 1 suppository (25 mg total) rectally 2 (two) times daily. 10/16/16   Isla Pence, MD  ibuprofen (ADVIL,MOTRIN) 600 MG tablet Take 1 tablet (600 mg total) by mouth every 6 (six) hours as needed. 10/01/16   Julianne Rice, MD  methocarbamol (ROBAXIN) 500 MG tablet Take 2 tablets (1,000 mg total) by mouth every 6 (six) hours  as needed for muscle spasms. 10/01/16   Julianne Rice, MD  predniSONE (DELTASONE) 20 MG tablet Take 2 tablets (40 mg total) by mouth daily. 07/14/16   [name], MD    Family History Family History  Problem Relation Age of Onset  . Hypertension Mother     Social History Social History  Substance Use Topics  . Smoking status: Current Every Day Smoker    Packs/day: 0.50  . Smokeless tobacco: Never Used  . Alcohol use Yes     Comment: occ     Allergies   Almond oil; Carrot [daucus carota]; Celery oil; Coconut flavor; and Macadamia nut oil   Review of Systems Review of Systems  Gastrointestinal: Positive for blood in stool.  All other systems reviewed and are negative.    Physical Exam Updated Vital Signs BP 127/79 (BP Location: Right Arm)   Pulse 65   Temp 99 F (37.2 C) (Oral)   Resp 18   Ht 5\' 2"  (1.575 m)   Wt 91 lb (41.3 kg)   LMP 10/16/2016   SpO2 99%   BMI 16.64 kg/m   Physical Exam  Constitutional: She appears well-developed and well-nourished.  HENT:  Head: Normocephalic and atraumatic.  Right Ear: External ear normal.  Left Ear: External ear normal.  Nose: Nose normal.  Mouth/Throat: Oropharynx is clear and moist.  Eyes: Conjunctivae and EOM are normal. Pupils are equal, round, and reactive to light.  Neck: Normal range of motion. Neck supple.  Cardiovascular: Normal rate, regular rhythm, normal heart sounds and intact distal pulses.   Pulmonary/Chest: Effort normal and breath sounds normal.  Abdominal: Soft. Bowel sounds are normal.  Genitourinary: Rectum normal. Rectal exam shows guaiac negative stool.  Musculoskeletal: Normal range of motion.  Neurological: She is alert.  Skin: Skin is warm.  Psychiatric: She has a normal mood and affect. Her behavior is normal. Judgment and thought content normal.  Nursing note and vitals reviewed.    ED Treatments / Results  Labs (all labs ordered are listed, but only abnormal results are  displayed) Labs Reviewed  COMPREHENSIVE METABOLIC PANEL - Abnormal; Notable for the following:       Result Value   Glucose, Bld 103 (*)    AST 58 (*)    All other components within normal limits  CBC - Abnormal; Notable for the following:    WBC 3.6 (*)    Hemoglobin 11.3 (*)    HCT 33.6 (*)    RDW 19.0 (*)    All other components within normal limits  POC OCCULT BLOOD, ED  TYPE AND SCREEN    EKG  EKG Interpretation None       Radiology No results found.  Procedures Procedures (including critical care time)  Medications Ordered in ED Medications  sodium chloride 0.9 % bolus 1,000 mL (not administered)     Initial Impression / Assessment and Plan / ED Course  I have reviewed the triage vital signs and the nursing notes.  Pertinent labs & imaging results that were available during my care of the patient were reviewed by me and considered in my medical decision making (see chart for details).    Pt is stable.  She is told to stop ibuprofen.  She knows to f/u with GI.  Return if worse.  Final Clinical Impressions(s) / ED Diagnoses   Final diagnoses:  Rectal bleeding    New Prescriptions New Prescriptions   HYDROCORTISONE (ANUSOL-HC) 25 MG SUPPOSITORY    Place 1 suppository (25 mg total) rectally 2 (two) times daily.     Isla Pence, MD 10/16/16 1246

## 2016-10-16 NOTE — ED Triage Notes (Signed)
Pt reports was seen on Monday and diagnosed with sciatica. Pt reports continued back pain. Pt reports abd pain and rectal bleeding since last night. Pt reports intermittent dizziness and reports black BM this am. nad noted.

## 2016-10-22 ENCOUNTER — Emergency Department (HOSPITAL_COMMUNITY)
Admission: EM | Admit: 2016-10-22 | Discharge: 2016-10-22 | Disposition: A | Discharge status: Home / Self Care | Payer: BLUE CROSS/BLUE SHIELD | Attending: Emergency Medicine | Admitting: Emergency Medicine

## 2016-10-22 ENCOUNTER — Encounter (HOSPITAL_COMMUNITY): Payer: Self-pay | Admitting: Emergency Medicine

## 2016-10-22 DIAGNOSIS — Z8541 Personal history of malignant neoplasm of cervix uteri: Secondary | ICD-10-CM | POA: Insufficient documentation

## 2016-10-22 DIAGNOSIS — K644 Residual hemorrhoidal skin tags: Secondary | ICD-10-CM | POA: Insufficient documentation

## 2016-10-22 DIAGNOSIS — J45909 Unspecified asthma, uncomplicated: Secondary | ICD-10-CM | POA: Insufficient documentation

## 2016-10-22 DIAGNOSIS — Z7982 Long term (current) use of aspirin: Secondary | ICD-10-CM | POA: Diagnosis not present

## 2016-10-22 DIAGNOSIS — F17200 Nicotine dependence, unspecified, uncomplicated: Secondary | ICD-10-CM | POA: Diagnosis not present

## 2016-10-22 DIAGNOSIS — K625 Hemorrhage of anus and rectum: Secondary | ICD-10-CM

## 2016-10-22 NOTE — ED Triage Notes (Signed)
PT c/o continued bright red rectal bleeding continued and pt states unable to use suppositories that were prescribed in the ED x1 week ago.

## 2016-10-22 NOTE — Discharge Instructions (Signed)
Avoid ibuprofen.  Begin to take over the counter fiber products (ie: Citucel, Metamucil) or stool softener (colace), as directed on packaging, for the next month.  Use over the counter hemorrhoidal relief products (ie:  Preparation H, Anusol, Tucks pads), as directed on packaging, as needed for symptoms.  Sit in a warm water tub several times per day for the next week.  Call the GI doctor this morning to schedule a follow up appointment this week.  Return to the Emergency Department immediately if worsening.

## 2016-10-22 NOTE — ED Provider Notes (Signed)
[facility] DEPT Provider Note   CSN: [id number] Arrival date & time: 10/22/16  3818     History   Chief Complaint Chief Complaint  Patient presents with  . Rectal Bleeding    HPI Marissa Frank is a 46 y.o. female.   Rectal Bleeding    Pt was seen at 0710. Per pt, c/o gradual onset and persistence of several intermittent episodes of rectal bleeding that began 1 week ago. Pt states the rectal bleeding started after she had a large BM 1 week ago. Rectal bleeding occurs when she has a BM. Has been associated with rectal pain described as "my hemorrhoid hurts."  Pt was evaluated in the ED last week for this complaint, was rx suppositories that she is not using "because they hurt to use." Pt has not f/u with GI MD as previously instructed. The symptoms have been associated with no other complaints. The patient has a significant history of similar symptoms previously, recently being evaluated for this complaint and multiple prior evals for same. Denies fevers, no rectal drainage, no incont stool, no abd pain, no black or bloody stools.     Past Medical History:  Diagnosis Date  . Asthma   . Cancer ([place])    cervical    There are no active problems to display for this patient.   Past Surgical History:  Procedure Laterality Date  . BREAST SURGERY    . CERVICAL CONE BIOPSY    . TUBAL LIGATION      OB History    Gravida Para Term Preterm AB Living   3 2 2   1      SAB TAB Ectopic Multiple Live Births     1             Home Medications    Prior to Admission medications   Medication Sig Start Date End Date Taking? Authorizing Provider  albuterol (PROVENTIL) (2.5 MG/3ML) 0.083% nebulizer solution Take 3 mLs (2.5 mg total) by nebulization every 6 (six) hours as needed for wheezing or shortness of breath. 07/14/16   [name], MD  aspirin 325 MG tablet Take 325 mg by mouth daily.    Historical Provider, MD  Cyanocobalamin (VITAMIN B-12 PO) Take 1 tablet by mouth  daily.    Historical Provider, MD  HYDROcodone-acetaminophen (NORCO/VICODIN) 5-325 MG tablet Take 1-2 tablets by mouth every 6 (six) hours as needed. 03/05/16   Fredia Sorrow, MD  hydrocortisone (ANUSOL-HC) 25 MG suppository Place 1 suppository (25 mg total) rectally 2 (two) times daily. 10/16/16   Isla Pence, MD  ibuprofen (ADVIL,MOTRIN) 600 MG tablet Take 1 tablet (600 mg total) by mouth every 6 (six) hours as needed. 10/01/16   Julianne Rice, MD  methocarbamol (ROBAXIN) 500 MG tablet Take 2 tablets (1,000 mg total) by mouth every 6 (six) hours as needed for muscle spasms. 10/01/16   Julianne Rice, MD  predniSONE (DELTASONE) 20 MG tablet Take 2 tablets (40 mg total) by mouth daily. 07/14/16   [name], MD    Family History Family History  Problem Relation Age of Onset  . Hypertension Mother     Social History Social History  Substance Use Topics  . Smoking status: Current Every Day Smoker    Packs/day: 0.50  . Smokeless tobacco: Never Used  . Alcohol use Yes     Comment: occ     Allergies   Almond oil; Carrot [daucus carota]; Celery oil; Coconut flavor; and Macadamia nut oil   Review of Systems  Review of Systems  Gastrointestinal: Positive for hematochezia.  ROS: Statement: All systems negative except as marked or noted in the HPI; Constitutional: Negative for fever and chills. ; ; Eyes: Negative for eye pain, redness and discharge. ; ; ENMT: Negative for ear pain, hoarseness, nasal congestion, sinus pressure and sore throat. ; ; Cardiovascular: Negative for chest pain, palpitations, diaphoresis, dyspnea and peripheral edema. ; ; Respiratory: Negative for cough, wheezing and stridor. ; ; Gastrointestinal: Negative for nausea, vomiting, diarrhea, abdominal pain, hematemesis, jaundice and +rectal bleeding. . ; ; Genitourinary: Negative for dysuria, flank pain and hematuria. ; ; Musculoskeletal: Negative for back pain and neck pain. Negative for swelling and trauma.; ; Skin:  Negative for pruritus, rash, abrasions, blisters, bruising and skin lesion.; ; Neuro: Negative for headache, lightheadedness and neck stiffness. Negative for weakness, altered level of consciousness, altered mental status, extremity weakness, paresthesias, involuntary movement, seizure and syncope.      Physical Exam Updated Vital Signs BP (!) 143/100 (BP Location: Left Arm)   Pulse 63   Temp 98.3 F (36.8 C) (Oral)   Resp 18   Ht 5\' 2"  (1.575 m)   Wt 91 lb (41.3 kg)   LMP 10/16/2016   SpO2 100%   BMI 16.64 kg/m   Physical Exam 0715: Physical examination:  Nursing notes reviewed; Vital signs and O2 SAT reviewed;  Constitutional: Well developed, Well nourished, Well hydrated, In no acute distress; Head:  Normocephalic, atraumatic; Eyes: EOMI, PERRL, No scleral icterus; ENMT: Mouth and pharynx normal, Mucous membranes moist; Neck: Supple, Full range of motion, No lymphadenopathy; Cardiovascular: Regular rate and rhythm, No gallop; Respiratory: Breath sounds clear & equal bilaterally, No wheezes.  Speaking full sentences with ease, Normal respiratory effort/excursion; Chest: Nontender, Movement normal; Abdomen: Soft, Nontender, Nondistended, Normal bowel sounds. Rectal exam performed w/permission of pt and ED RN chaperone present.  Anal tone normal. No fissures, +tender external hemorrhoid without thrombosis or bleeding, no rectal drainage, no palp masses.; Genitourinary: No CVA tenderness; Extremities: Pulses normal, No tenderness, No edema, No calf edema or asymmetry.; Neuro: AA&Ox3, Major CN grossly intact.  Speech clear. No gross focal motor or sensory deficits in extremities.; Skin: Color normal, Warm, Dry.   ED Treatments / Results  Labs (all labs ordered are listed, but only abnormal results are displayed)   EKG  EKG Interpretation None       Radiology   Procedures Procedures (including critical care time)  Medications Ordered in ED Medications - No data to  display   Initial Impression / Assessment and Plan / ED Course  I have reviewed the triage vital signs and the nursing notes.  Pertinent labs & imaging results that were available during my care of the patient were reviewed by me and considered in my medical decision making (see chart for details).  MDM Reviewed: previous chart, nursing note and vitals Reviewed previous: labs   0725:  Tender external hemorrhoid on exam. No active bleeding. Tx symptomatically, f/u GI MD. Dx d/w pt.  Questions answered.  Verb understanding, agreeable to d/c home with outpt f/u.    Final Clinical Impressions(s) / ED Diagnoses   Final diagnoses:  None    New Prescriptions New Prescriptions   No medications on file      Francine Graven, DO 10/26/16 1655

## 2016-12-10 ENCOUNTER — Emergency Department (HOSPITAL_COMMUNITY)
Admission: EM | Admit: 2016-12-10 | Discharge: 2016-12-10 | Disposition: A | Discharge status: Home / Self Care | Payer: BLUE CROSS/BLUE SHIELD | Attending: Emergency Medicine | Admitting: Emergency Medicine

## 2016-12-10 ENCOUNTER — Encounter (HOSPITAL_COMMUNITY): Payer: Self-pay | Admitting: *Deleted

## 2016-12-10 DIAGNOSIS — Z7982 Long term (current) use of aspirin: Secondary | ICD-10-CM | POA: Insufficient documentation

## 2016-12-10 DIAGNOSIS — K6289 Other specified diseases of anus and rectum: Secondary | ICD-10-CM | POA: Diagnosis present

## 2016-12-10 DIAGNOSIS — Z79899 Other long term (current) drug therapy: Secondary | ICD-10-CM | POA: Insufficient documentation

## 2016-12-10 DIAGNOSIS — K644 Residual hemorrhoidal skin tags: Secondary | ICD-10-CM | POA: Diagnosis not present

## 2016-12-10 DIAGNOSIS — F17200 Nicotine dependence, unspecified, uncomplicated: Secondary | ICD-10-CM | POA: Insufficient documentation

## 2016-12-10 DIAGNOSIS — J45909 Unspecified asthma, uncomplicated: Secondary | ICD-10-CM | POA: Insufficient documentation

## 2016-12-10 DIAGNOSIS — Z8541 Personal history of malignant neoplasm of cervix uteri: Secondary | ICD-10-CM | POA: Insufficient documentation

## 2016-12-10 MED ORDER — HYDROCORTISONE ACETATE 25 MG RE SUPP: 25.0000 mg | Freq: Two times a day (BID) | RECTAL | 0 refills | Status: DC

## 2016-12-10 MED ORDER — DOCUSATE SODIUM 100 MG PO CAPS: 100.0000 mg | ORAL_CAPSULE | Freq: Two times a day (BID) | ORAL | 0 refills | Status: DC

## 2016-12-10 MED ORDER — TRAMADOL HCL 50 MG PO TABS: 50.0000 mg | ORAL_TABLET | Freq: Four times a day (QID) | ORAL | 0 refills | Status: DC | PRN

## 2016-12-10 NOTE — ED Notes (Signed)
Pt made aware to return if symptoms worsen or if any life threatening symptoms occur.   

## 2016-12-10 NOTE — ED Triage Notes (Signed)
Pt states had small amout of bleeding noted yesterday, this morning a lot worse, pain & bleeding.

## 2016-12-10 NOTE — ED Provider Notes (Signed)
[facility] DEPT Provider Note   CSN: [id number] Arrival date & time: 12/10/16  8786     History   Chief Complaint Chief Complaint  Patient presents with  . Hemorrhoids    HPI Marissa Frank is a 46 y.o. female.  HPI  Patient presents with painful rectal bleeding starting yesterday. States she was straining to have a bowel movement when she noticed blood on the toilet paper. She had another hard bowel movement this morning with blood in the toilet. She also complains of pain in the rectum. No vaginal bleeding or hematuria. She is yet to see a gastroenterologist. States she does a lot of heavy lifting at work. Denies any abdominal pain.  Past Medical History:  Diagnosis Date  . Asthma   . Cancer ([place])    cervical    There are no active problems to display for this patient.   Past Surgical History:  Procedure Laterality Date  . BREAST SURGERY    . CERVICAL CONE BIOPSY    . TUBAL LIGATION      OB History    Gravida Para Term Preterm AB Living   3 2 2   1      SAB TAB Ectopic Multiple Live Births     1             Home Medications    Prior to Admission medications   Medication Sig Start Date End Date Taking? Authorizing Provider  albuterol (PROVENTIL) (2.5 MG/3ML) 0.083% nebulizer solution Take 3 mLs (2.5 mg total) by nebulization every 6 (six) hours as needed for wheezing or shortness of breath. 07/14/16  Yes [name], MD  aspirin 325 MG tablet Take 325 mg by mouth daily.   Yes [provider]  Cyanocobalamin (VITAMIN B-12 PO) Take 1 tablet by mouth daily.   Yes [provider]  HYDROcodone-acetaminophen (NORCO/VICODIN) 5-325 MG tablet Take 1-2 tablets by mouth every 6 (six) hours as needed. 03/05/16  Yes Fredia Sorrow, MD  ibuprofen (ADVIL,MOTRIN) 600 MG tablet Take 1 tablet (600 mg total) by mouth every 6 (six) hours as needed. 10/01/16  Yes Julianne Rice, MD  methocarbamol (ROBAXIN) 500 MG tablet Take 2 tablets (1,000 mg  total) by mouth every 6 (six) hours as needed for muscle spasms. 10/01/16  Yes Julianne Rice, MD  predniSONE (DELTASONE) 20 MG tablet Take 2 tablets (40 mg total) by mouth daily. 07/14/16  Yes [name], MD  docusate sodium (COLACE) 100 MG capsule Take 1 capsule (100 mg total) by mouth every 12 (twelve) hours. 12/10/16   Julianne Rice, MD  hydrocortisone (ANUSOL-HC) 25 MG suppository Place 1 suppository (25 mg total) rectally 2 (two) times daily. 12/10/16   Julianne Rice, MD  traMADol (ULTRAM) 50 MG tablet Take 1 tablet (50 mg total) by mouth every 6 (six) hours as needed for severe pain. 12/10/16   Julianne Rice, MD    Family History Family History  Problem Relation Age of Onset  . Hypertension Mother     Social History Social History  Substance Use Topics  . Smoking status: Current Every Day Smoker    Packs/day: 0.50  . Smokeless tobacco: Never Used  . Alcohol use Yes     Comment: occ     Allergies   Almond oil; Carrot [daucus carota]; Celery oil; Coconut flavor; and Macadamia nut oil   Review of Systems Review of Systems  Constitutional: Negative for chills and fever.  Respiratory: Negative for shortness of breath.   Cardiovascular: Negative for  chest pain.  Gastrointestinal: Positive for anal bleeding, blood in stool and rectal pain. Negative for abdominal pain, diarrhea, nausea and vomiting.  Genitourinary: Negative for dysuria, flank pain, hematuria, pelvic pain and vaginal bleeding.  Musculoskeletal: Negative for back pain, myalgias and neck pain.  Skin: Negative for rash and wound.  Neurological: Negative for dizziness, weakness, light-headedness, numbness and headaches.  All other systems reviewed and are negative.    Physical Exam Updated Vital Signs BP 116/84 (BP Location: Left Arm)   Pulse 75   Temp 97.9 F (36.6 C) (Oral)   Resp 20   Ht 5\' 2"  (1.575 m)   Wt 91 lb (41.3 kg)   LMP 11/10/2016   SpO2 100%   BMI 16.64 kg/m   Physical Exam    Constitutional: She is oriented to person, place, and time. She appears well-developed and well-nourished.  HENT:  Head: Normocephalic and atraumatic.  Mouth/Throat: Oropharynx is clear and moist.  Eyes: EOM are normal. Pupils are equal, round, and reactive to light.  Neck: Normal range of motion. Neck supple.  Cardiovascular: Normal rate and regular rhythm.   Pulmonary/Chest: Effort normal and breath sounds normal.  Abdominal: Soft. Bowel sounds are normal. There is no tenderness. There is no rebound and no guarding.  Genitourinary:  Genitourinary Comments: Patient with small anterior external hemorrhoid with a very small amount of red blood at the site. There is no active bleeding. Hemorrhoid is tender to palpation. No appreciated inguinal seizures. Patient appears to have some mildly enlarged internal hemorrhoids. No appreciated perirectal masses.  Musculoskeletal: Normal range of motion. She exhibits no edema or tenderness.  Neurological: She is alert and oriented to person, place, and time.  Skin: Skin is warm and dry. No rash noted. No erythema.  Psychiatric: She has a normal mood and affect. Her behavior is normal.  Nursing note and vitals reviewed.    ED Treatments / Results  Labs (all labs ordered are listed, but only abnormal results are displayed) Labs Reviewed - No data to display  EKG  EKG Interpretation None       Radiology No results found.  Procedures Procedures (including critical care time)  Medications Ordered in ED Medications - No data to display   Initial Impression / Assessment and Plan / ED Course  I have reviewed the triage vital signs and the nursing notes.  Pertinent labs & imaging results that were available during my care of the patient were reviewed by me and considered in my medical decision making (see chart for details).     Patient presents with rectal pain and bleeding. Likely due to external hemorrhoids. May also have a fissure  present did not appreciate this on exam. Do not believe the patient has thrombosed hemorrhoids or perirectal abscess. We will treat symptomatically and have referred to gastroenterology for further evaluation. Return precautions given.  Final Clinical Impressions(s) / ED Diagnoses   Final diagnoses:  Bleeding external hemorrhoids    New Prescriptions New Prescriptions   DOCUSATE SODIUM (COLACE) 100 MG CAPSULE    Take 1 capsule (100 mg total) by mouth every 12 (twelve) hours.   TRAMADOL (ULTRAM) 50 MG TABLET    Take 1 tablet (50 mg total) by mouth every 6 (six) hours as needed for severe pain.     Julianne Rice, MD 12/10/16 [phone]

## 2016-12-10 NOTE — ED Notes (Signed)
Pt given new pad.

## 2017-02-27 ENCOUNTER — Encounter (HOSPITAL_COMMUNITY): Payer: Self-pay | Admitting: Emergency Medicine

## 2017-02-27 ENCOUNTER — Emergency Department (HOSPITAL_COMMUNITY): Payer: BLUE CROSS/BLUE SHIELD

## 2017-02-27 ENCOUNTER — Emergency Department (HOSPITAL_COMMUNITY)
Admission: EM | Admit: 2017-02-27 | Discharge: 2017-02-27 | Disposition: A | Discharge status: Home / Self Care | Payer: BLUE CROSS/BLUE SHIELD | Attending: Emergency Medicine | Admitting: Emergency Medicine

## 2017-02-27 DIAGNOSIS — J45909 Unspecified asthma, uncomplicated: Secondary | ICD-10-CM | POA: Diagnosis not present

## 2017-02-27 DIAGNOSIS — S91312A Laceration without foreign body, left foot, initial encounter: Secondary | ICD-10-CM | POA: Insufficient documentation

## 2017-02-27 DIAGNOSIS — F17200 Nicotine dependence, unspecified, uncomplicated: Secondary | ICD-10-CM | POA: Insufficient documentation

## 2017-02-27 DIAGNOSIS — Z7982 Long term (current) use of aspirin: Secondary | ICD-10-CM | POA: Diagnosis not present

## 2017-02-27 DIAGNOSIS — I10 Essential (primary) hypertension: Secondary | ICD-10-CM | POA: Diagnosis not present

## 2017-02-27 DIAGNOSIS — Z79899 Other long term (current) drug therapy: Secondary | ICD-10-CM | POA: Insufficient documentation

## 2017-02-27 DIAGNOSIS — Y998 Other external cause status: Secondary | ICD-10-CM | POA: Insufficient documentation

## 2017-02-27 DIAGNOSIS — Y929 Unspecified place or not applicable: Secondary | ICD-10-CM | POA: Insufficient documentation

## 2017-02-27 DIAGNOSIS — Y280XXA Contact with sharp glass, undetermined intent, initial encounter: Secondary | ICD-10-CM | POA: Diagnosis not present

## 2017-02-27 DIAGNOSIS — Z23 Encounter for immunization: Secondary | ICD-10-CM | POA: Diagnosis not present

## 2017-02-27 DIAGNOSIS — Y939 Activity, unspecified: Secondary | ICD-10-CM | POA: Diagnosis not present

## 2017-02-27 DIAGNOSIS — S99922A Unspecified injury of left foot, initial encounter: Secondary | ICD-10-CM | POA: Diagnosis present

## 2017-02-27 MED ORDER — LIDOCAINE-EPINEPHRINE (PF) 2 %-1:200000 IJ SOLN
10.0000 mL | Freq: Once | INTRAMUSCULAR | Status: AC
  Administered 2017-02-27: 10 mL
  Filled 2017-02-27: qty 20

## 2017-02-27 MED ORDER — TETANUS-DIPHTH-ACELL PERTUSSIS 5-2.5-18.5 LF-MCG/0.5 IM SUSP
0.5000 mL | Freq: Once | INTRAMUSCULAR | Status: AC
  Administered 2017-02-27: 0.5 mL via INTRAMUSCULAR
  Filled 2017-02-27: qty 0.5

## 2017-02-27 NOTE — ED Provider Notes (Signed)
[facility] DEPT Provider Note   CSN: [id number] Arrival date & time: 02/27/17  0604     History   Chief Complaint Chief Complaint  Patient presents with  . Laceration    HPI Marissa Frank is a 46 y.o. female.  Pt stepped on broken glass last night.  She has a constant throbbing in her foot.  When she tries to stand the bleeding will restart.  No numbness or weakness.  No other complaints or injuries.   The history is provided by the patient.  Laceration   Incident onset: about 7pm last night. The laceration is located on the left foot. The laceration is 1 cm in size. The laceration mechanism was a broken glass. The pain is moderate. The pain has been constant since onset. She reports no foreign bodies present. Her tetanus status is unknown.    Past Medical History:  Diagnosis Date  . Asthma   . Cancer ([place])    cervical    There are no active problems to display for this patient.   Past Surgical History:  Procedure Laterality Date  . BREAST SURGERY    . CERVICAL CONE BIOPSY    . TUBAL LIGATION      OB History    Gravida Para Term Preterm AB Living   3 2 2   1      SAB TAB Ectopic Multiple Live Births     1             Home Medications    Prior to Admission medications   Medication Sig Start Date End Date Taking? Authorizing Provider  albuterol (PROVENTIL) (2.5 MG/3ML) 0.083% nebulizer solution Take 3 mLs (2.5 mg total) by nebulization every 6 (six) hours as needed for wheezing or shortness of breath. 07/14/16  Yes [name], MD  aspirin 325 MG tablet Take 325 mg by mouth daily.   Yes [provider]  Cyanocobalamin (VITAMIN B-12 PO) Take 1 tablet by mouth daily.   Yes [provider]  hydrocortisone (ANUSOL-HC) 25 MG suppository Place 1 suppository (25 mg total) rectally 2 (two) times daily. 12/10/16  Yes Julianne Rice, MD  ibuprofen (ADVIL,MOTRIN) 600 MG tablet Take 1 tablet (600 mg total) by mouth every 6 (six) hours as  needed. 10/01/16  Yes Julianne Rice, MD  predniSONE (DELTASONE) 20 MG tablet Take 2 tablets (40 mg total) by mouth daily. 07/14/16  Yes [name], MD  docusate sodium (COLACE) 100 MG capsule Take 1 capsule (100 mg total) by mouth every 12 (twelve) hours. 12/10/16   Julianne Rice, MD  HYDROcodone-acetaminophen (NORCO/VICODIN) 5-325 MG tablet Take 1-2 tablets by mouth every 6 (six) hours as needed. 03/05/16   Fredia Sorrow, MD  methocarbamol (ROBAXIN) 500 MG tablet Take 2 tablets (1,000 mg total) by mouth every 6 (six) hours as needed for muscle spasms. 10/01/16   Julianne Rice, MD  traMADol (ULTRAM) 50 MG tablet Take 1 tablet (50 mg total) by mouth every 6 (six) hours as needed for severe pain. 12/10/16   Julianne Rice, MD    Family History Family History  Problem Relation Age of Onset  . Hypertension Mother     Social History Social History  Substance Use Topics  . Smoking status: Current Every Day Smoker    Packs/day: 0.50  . Smokeless tobacco: Never Used  . Alcohol use Yes     Comment: occ     Allergies   Almond oil; Carrot [daucus carota]; Celery oil; Coconut flavor; and Macadamia nut  oil   Review of Systems Review of Systems  All other systems reviewed and are negative.    Physical Exam Updated Vital Signs BP (!) 146/70 (BP Location: Left Arm)   Pulse 62   Temp 98.1 F (36.7 C) (Oral)   Resp 17   Ht 1.549 m (5\' 1" )   Wt 41.3 kg (91 lb)   LMP 02/14/2017   SpO2 100%   BMI 17.19 kg/m   Physical Exam  Constitutional: She appears well-developed and well-nourished. No distress.  HENT:  Head: Normocephalic and atraumatic.  Right Ear: External ear normal.  Left Ear: External ear normal.  Eyes: Conjunctivae are normal. Right eye exhibits no discharge. Left eye exhibits no discharge. No scleral icterus.  Neck: Neck supple. No tracheal deviation present.  Cardiovascular: Normal rate.   Pulmonary/Chest: Effort normal. No stridor. No respiratory distress.    Abdominal: She exhibits no distension.  Musculoskeletal: She exhibits no edema.  Neurological: She is alert. Cranial nerve deficit: no gross deficits.  Skin: Skin is warm and dry. No rash noted.  1 cm laceration plantar aspect of left foot, no foreign body palpated  Psychiatric: She has a normal mood and affect.  Nursing note and vitals reviewed.    ED Treatments / Results  Labs (all labs ordered are listed, but only abnormal results are displayed) Labs Reviewed - No data to display   Radiology Dg Foot Complete Left  Result Date: 02/27/2017 CLINICAL DATA:  Lacerations the left foot from broken mirror last night. EXAM: LEFT FOOT - COMPLETE 3+ VIEW COMPARISON:  06/26/2005 FINDINGS: There is no evidence of fracture or dislocation. There is no evidence of arthropathy or other focal bone abnormality. Soft tissues are unremarkable. IMPRESSION: No acute bony abnormalities. No radiopaque soft tissue foreign bodies. Electronically Signed   By: Lucienne Capers M.D.   On: 02/27/2017 06:41    Procedures .Marland KitchenLaceration Repair Date/Time: 02/27/2017 7:42 AM Performed by: Dorie Rank Authorized by: Dorie Rank   Consent:    Consent obtained:  Verbal   Consent given by:  Patient   Risks discussed:  Infection, need for additional repair, pain, poor cosmetic result and poor wound healing   Alternatives discussed:  No treatment and delayed treatment Anesthesia (see MAR for exact dosages):    Anesthesia method:  Local infiltration   Local anesthetic:  Lidocaine 1% WITH epi Laceration details:    Location:  Foot   Length (cm):  1.2 Repair type:    Repair type:  Simple Pre-procedure details:    Preparation:  Patient was prepped and draped in usual sterile fashion and imaging obtained to evaluate for foreign bodies Exploration:    Wound exploration: entire depth of wound probed and visualized     Wound extent: no foreign bodies/material noted, no nerve damage noted, no tendon damage noted and no  vascular damage noted     Contaminated: no   Treatment:    Area cleansed with:  Saline   Amount of cleaning:  Standard   Irrigation solution:  Sterile saline   Visualized foreign bodies/material removed: no   Skin repair:    Repair method:  Sutures   Suture size:  4-0   Suture material:  Prolene   Suture technique:  Simple interrupted   Number of sutures:  3 Approximation:    Approximation:  Close   Vermilion border: well-aligned   Post-procedure details:    Dressing:  Sterile dressing   Patient tolerance of procedure:  Tolerated well, no immediate complications   (  including critical care time)  Medications Ordered in ED Medications  Tdap (BOOSTRIX) injection 0.5 mL (0.5 mLs Intramuscular Given 02/27/17 0717)  lidocaine-EPINEPHrine (XYLOCAINE W/EPI) 2 %-1:200000 (PF) injection 10 mL (10 mLs Infiltration Given 02/27/17 6387)     Initial Impression / Assessment and Plan / ED Course  I have reviewed the triage vital signs and the nursing notes.  Pertinent labs & imaging results that were available during my care of the patient were reviewed by me and considered in my medical decision making (see chart for details).   Laceration repaired in the ED.  Wounds explored thoroughly.  No foreign body palpated or visualized but visualization was difficult because the wound was not large.  Discussed suture removal.  Discussed change of retained foreign body and to be re evaluated if sx persist after a week or so.  Final Clinical Impressions(s) / ED Diagnoses   Final diagnoses:  Laceration of left foot, initial encounter    New Prescriptions New Prescriptions   No medications on file     Dorie Rank, MD 02/27/17 [phone]

## 2017-02-27 NOTE — ED Triage Notes (Signed)
Pt states a mirror broke in her home and she cut her left foot last night about 1900 last night. Bleeding controlled at this time.

## 2017-02-27 NOTE — Discharge Instructions (Signed)
Suture removal in 7-10 days, apply antibiotic ointment to the wound and keep a bandage over it, change daily, monitor for fever, worsening symptoms

## 2017-02-27 NOTE — ED Notes (Signed)
Pt returned from xray

## 2017-03-05 ENCOUNTER — Encounter (HOSPITAL_COMMUNITY): Payer: Self-pay | Admitting: *Deleted

## 2017-03-05 ENCOUNTER — Emergency Department (HOSPITAL_COMMUNITY)
Admission: EM | Admit: 2017-03-05 | Discharge: 2017-03-05 | Disposition: A | Discharge status: Home / Self Care | Payer: BLUE CROSS/BLUE SHIELD | Attending: Emergency Medicine | Admitting: Emergency Medicine

## 2017-03-05 DIAGNOSIS — Y998 Other external cause status: Secondary | ICD-10-CM | POA: Insufficient documentation

## 2017-03-05 DIAGNOSIS — J45909 Unspecified asthma, uncomplicated: Secondary | ICD-10-CM | POA: Insufficient documentation

## 2017-03-05 DIAGNOSIS — S91302A Unspecified open wound, left foot, initial encounter: Secondary | ICD-10-CM | POA: Insufficient documentation

## 2017-03-05 DIAGNOSIS — M25579 Pain in unspecified ankle and joints of unspecified foot: Secondary | ICD-10-CM | POA: Diagnosis present

## 2017-03-05 DIAGNOSIS — F17210 Nicotine dependence, cigarettes, uncomplicated: Secondary | ICD-10-CM | POA: Insufficient documentation

## 2017-03-05 DIAGNOSIS — Y939 Activity, unspecified: Secondary | ICD-10-CM | POA: Diagnosis not present

## 2017-03-05 DIAGNOSIS — Y280XXA Contact with sharp glass, undetermined intent, initial encounter: Secondary | ICD-10-CM | POA: Diagnosis not present

## 2017-03-05 DIAGNOSIS — Y929 Unspecified place or not applicable: Secondary | ICD-10-CM | POA: Diagnosis not present

## 2017-03-05 DIAGNOSIS — Z4802 Encounter for removal of sutures: Secondary | ICD-10-CM | POA: Insufficient documentation

## 2017-03-05 DIAGNOSIS — M79672 Pain in left foot: Secondary | ICD-10-CM | POA: Insufficient documentation

## 2017-03-05 MED ORDER — MELOXICAM 15 MG PO TABS: 15.0000 mg | ORAL_TABLET | Freq: Every day | ORAL | 0 refills | Status: DC

## 2017-03-05 NOTE — ED Provider Notes (Signed)
[facility] DEPT Provider Note   CSN: [id number] Arrival date & time: 03/05/17  0518     History   Chief Complaint Chief Complaint  Patient presents with  . Foot Pain    HPI Marissa Frank is a 46 y.o. female.  Patient presents with complaints of pain in the left foot. Patient was seen one week ago after suffering a laceration. She reportedly stepped on glass suffering a laceration on the plantar aspect of the foot. 3 sutures were placed. Patient reports for the last several days she has been having excessively increasing pain. Pain worsens when she tries to walk on it. No drainage. No fever.      Past Medical History:  Diagnosis Date  . Asthma   . Cancer ([place])    cervical    There are no active problems to display for this patient.   Past Surgical History:  Procedure Laterality Date  . BREAST SURGERY    . CERVICAL CONE BIOPSY    . TUBAL LIGATION      OB History    Gravida Para Term Preterm AB Living   3 2 2   1      SAB TAB Ectopic Multiple Live Births     1             Home Medications    Prior to Admission medications   Medication Sig Start Date End Date Taking? Authorizing Provider  albuterol (PROVENTIL) (2.5 MG/3ML) 0.083% nebulizer solution Take 3 mLs (2.5 mg total) by nebulization every 6 (six) hours as needed for wheezing or shortness of breath. 07/14/16   [name], MD  aspirin 325 MG tablet Take 325 mg by mouth daily.    [provider]  Cyanocobalamin (VITAMIN B-12 PO) Take 1 tablet by mouth daily.    [provider]  meloxicam (MOBIC) 15 MG tablet Take 1 tablet (15 mg total) by mouth daily. 03/05/17   Orpah Greek, MD    Family History Family History  Problem Relation Age of Onset  . Hypertension Mother     Social History Social History  Substance Use Topics  . Smoking status: Current Every Day Smoker    Packs/day: 0.50  . Smokeless tobacco: Never Used  . Alcohol use Yes     Comment: occ      Allergies   Almond oil; Carrot [daucus carota]; Celery oil; Coconut flavor; and Macadamia nut oil   Review of Systems Review of Systems  Skin: Positive for wound.  All other systems reviewed and are negative.    Physical Exam Updated Vital Signs BP 138/80 (BP Location: Left Arm)   Pulse 87   Temp 98.2 F (36.8 C) (Oral)   Resp 20   Ht 5\' 1"  (1.549 m)   Wt 41.3 kg (91 lb)   LMP 02/14/2017   SpO2 100%   BMI 17.19 kg/m   Physical Exam  Constitutional: She is oriented to person, place, and time. She appears well-developed.  Eyes: Pupils are equal, round, and reactive to light.  Pulmonary/Chest: Effort normal.  Musculoskeletal: Normal range of motion. She exhibits no edema or deformity.  Neurological: She is alert and oriented to person, place, and time.  Skin: Skin is warm and dry.  1 cm laceration plantar medial aspect of left foot at the instep region. 3 sutures in place. No erythema, warmth. No swelling. No induration. No fluctuance. Area hyper aesthetic.  Psychiatric: She has a normal mood and affect.     ED  Treatments / Results  Labs (all labs ordered are listed, but only abnormal results are displayed) Labs Reviewed - No data to display  EKG  EKG Interpretation None       Radiology No results found.  Procedures Procedures (including critical care time)  Medications Ordered in ED Medications - No data to display   Initial Impression / Assessment and Plan / ED Course  I have reviewed the triage vital signs and the nursing notes.  Pertinent labs & imaging results that were available during my care of the patient were reviewed by me and considered in my medical decision making (see chart for details).     Patient complaining of severe pain, out of proportion to the exam. No sign of infection. I do not palpate any foreign bodies under the surface. X-ray from previous visit was reviewed, no evidence of foreign body present. Sutures were removed,  wound appears to be healing appropriately.  Final Clinical Impressions(s) / ED Diagnoses   Final diagnoses:  Visit for suture removal    New Prescriptions New Prescriptions   MELOXICAM (MOBIC) 15 MG TABLET    Take 1 tablet (15 mg total) by mouth daily.     Orpah Greek, MD 03/05/17 [phone]

## 2017-03-05 NOTE — ED Notes (Signed)
Pt alert & oriented x4, stable gait. Patient given discharge instructions, paperwork & prescription(s). Patient verbalized understanding. Pt left department in wheelchair escorted by staff. Pt left w/ no further questions. 

## 2017-03-05 NOTE — ED Triage Notes (Signed)
Pt recently had stitches to bottom of left foot; pt states she is having severe pain; wound has no redness or drainage and stitches are still intact

## 2017-03-05 NOTE — ED Notes (Signed)
Pt states left foot really hurting. Pt has sutures still in foot. No redness, swelling or warmth noted.

## 2017-03-23 ENCOUNTER — Emergency Department (HOSPITAL_COMMUNITY)
Admission: EM | Admit: 2017-03-23 | Discharge: 2017-03-23 | Disposition: A | Discharge status: Home / Self Care | Payer: BLUE CROSS/BLUE SHIELD | Attending: Emergency Medicine | Admitting: Emergency Medicine

## 2017-03-23 ENCOUNTER — Encounter (HOSPITAL_COMMUNITY): Payer: Self-pay | Admitting: Emergency Medicine

## 2017-03-23 DIAGNOSIS — L239 Allergic contact dermatitis, unspecified cause: Secondary | ICD-10-CM | POA: Diagnosis not present

## 2017-03-23 DIAGNOSIS — J45909 Unspecified asthma, uncomplicated: Secondary | ICD-10-CM | POA: Diagnosis not present

## 2017-03-23 DIAGNOSIS — Z7982 Long term (current) use of aspirin: Secondary | ICD-10-CM | POA: Insufficient documentation

## 2017-03-23 DIAGNOSIS — F17200 Nicotine dependence, unspecified, uncomplicated: Secondary | ICD-10-CM | POA: Diagnosis not present

## 2017-03-23 DIAGNOSIS — R21 Rash and other nonspecific skin eruption: Secondary | ICD-10-CM | POA: Diagnosis present

## 2017-03-23 DIAGNOSIS — Z8541 Personal history of malignant neoplasm of cervix uteri: Secondary | ICD-10-CM | POA: Diagnosis not present

## 2017-03-23 MED ORDER — METHYLPREDNISOLONE SODIUM SUCC 125 MG IJ SOLR
125.0000 mg | Freq: Once | INTRAMUSCULAR | Status: AC
  Administered 2017-03-23: 125 mg via INTRAMUSCULAR
  Filled 2017-03-23: qty 2

## 2017-03-23 MED ORDER — FAMOTIDINE 20 MG PO TABS: 20.0000 mg | ORAL_TABLET | Freq: Two times a day (BID) | ORAL | 0 refills | Status: DC

## 2017-03-23 MED ORDER — DIPHENHYDRAMINE HCL 25 MG PO CAPS
25.0000 mg | ORAL_CAPSULE | Freq: Once | ORAL | Status: AC
  Administered 2017-03-23: 25 mg via ORAL
  Filled 2017-03-23: qty 1

## 2017-03-23 MED ORDER — PREDNISONE 10 MG PO TABS: 20.0000 mg | ORAL_TABLET | Freq: Every day | ORAL | 0 refills | Status: DC

## 2017-03-23 MED ORDER — FAMOTIDINE 20 MG PO TABS
20.0000 mg | ORAL_TABLET | Freq: Once | ORAL | Status: AC
  Administered 2017-03-23: 20 mg via ORAL
  Filled 2017-03-23: qty 1

## 2017-03-23 NOTE — ED Provider Notes (Signed)
[facility] DEPT Provider Note   CSN: [id number] Arrival date & time: 03/23/17  2202     History   Chief Complaint Chief Complaint  Patient presents with  . Rash    HPI Marissa Frank is a 46 y.o. female.  Patient complains of a pruritic rash all throughout her body. She does not know of anything that she came in contact with tocolysis allergic reaction   The history is provided by the patient.  Rash   This is a new problem. The current episode started 2 days ago. The problem has not changed since onset.The problem is associated with nothing. There has been no fever. The rash is present on the torso. The patient is experiencing no pain. The pain has been constant since onset.    Past Medical History:  Diagnosis Date  . Asthma   . Cancer ([place])    cervical    There are no active problems to display for this patient.   Past Surgical History:  Procedure Laterality Date  . BREAST SURGERY    . CERVICAL CONE BIOPSY    . TUBAL LIGATION      OB History    Gravida Para Term Preterm AB Living   3 2 2   1      SAB TAB Ectopic Multiple Live Births     1             Home Medications    Prior to Admission medications   Medication Sig Start Date End Date Taking? Authorizing Provider  albuterol (PROVENTIL) (2.5 MG/3ML) 0.083% nebulizer solution Take 3 mLs (2.5 mg total) by nebulization every 6 (six) hours as needed for wheezing or shortness of breath. 07/14/16   [name], MD  aspirin 325 MG tablet Take 325 mg by mouth daily.    [provider]  Cyanocobalamin (VITAMIN B-12 PO) Take 1 tablet by mouth daily.    [provider]  famotidine (PEPCID) 20 MG tablet Take 1 tablet (20 mg total) by mouth 2 (two) times daily. 03/23/17   Milton Ferguson, MD  meloxicam (MOBIC) 15 MG tablet Take 1 tablet (15 mg total) by mouth daily. 03/05/17   Orpah Greek, MD  predniSONE (DELTASONE) 10 MG tablet Take 2 tablets (20 mg total) by mouth daily. 03/23/17    Milton Ferguson, MD    Family History Family History  Problem Relation Age of Onset  . Hypertension Mother     Social History Social History  Substance Use Topics  . Smoking status: Current Every Day Smoker    Packs/day: 0.50  . Smokeless tobacco: Never Used  . Alcohol use Yes     Comment: occ     Allergies   Almond oil; Carrot [daucus carota]; Celery oil; Coconut flavor; and Macadamia nut oil   Review of Systems Review of Systems  Constitutional: Negative for appetite change and fatigue.  HENT: Negative for congestion, ear discharge and sinus pressure.   Eyes: Negative for discharge.  Respiratory: Negative for cough.   Cardiovascular: Negative for chest pain.  Gastrointestinal: Negative for abdominal pain and diarrhea.  Genitourinary: Negative for frequency and hematuria.  Musculoskeletal: Negative for back pain.  Skin: Positive for rash.  Neurological: Negative for seizures and headaches.  Psychiatric/Behavioral: Negative for hallucinations.     Physical Exam Updated Vital Signs BP 101/71 (BP Location: Left Arm)   Pulse (!) 59   Temp 98 F (36.7 C) (Oral)   Resp 16   Ht 5\' 1"  (1.549 m)  Wt 41.7 kg (92 lb)   LMP 02/20/2017   SpO2 99%   BMI 17.38 kg/m   Physical Exam  Constitutional: She is oriented to person, place, and time. She appears well-developed.  HENT:  Head: Normocephalic.  Eyes: Conjunctivae and EOM are normal. No scleral icterus.  Neck: Neck supple. No thyromegaly present.  Cardiovascular: Normal rate and regular rhythm.  Exam reveals no gallop and no friction rub.   No murmur heard. Pulmonary/Chest: No stridor. She has no wheezes. She has no rales. She exhibits no tenderness.  Abdominal: She exhibits no distension. There is no tenderness. There is no rebound.  Musculoskeletal: Normal range of motion. She exhibits no edema.  Lymphadenopathy:    She has no cervical adenopathy.  Neurological: She is oriented to person, place, and time.  She exhibits normal muscle tone. Coordination normal.  Skin: Rash noted. There is erythema.  patient has a fine macular papular rash all throughout her torso and extremities.  Psychiatric: She has a normal mood and affect. Her behavior is normal.     ED Treatments / Results  Labs (all labs ordered are listed, but only abnormal results are displayed) Labs Reviewed - No data to display  EKG  EKG Interpretation None       Radiology No results found.  Procedures Procedures (including critical care time)  Medications Ordered in ED Medications  methylPREDNISolone sodium succinate (SOLU-MEDROL) 125 mg/2 mL injection 125 mg (125 mg Intramuscular Given 03/23/17 0943)  diphenhydrAMINE (BENADRYL) capsule 25 mg (25 mg Oral Given 03/23/17 0942)  famotidine (PEPCID) tablet 20 mg (20 mg Oral Given 03/23/17 2637)     Initial Impression / Assessment and Plan / ED Course  I have reviewed the triage vital signs and the nursing notes.  Pertinent labs & imaging results that were available during my care of the patient were reviewed by me and considered in my medical decision making (see chart for details).     Patient with allergic dermatitis. She will be placed on steroids Pepcid and Benadryl and will follow-up with the family doctor  Final Clinical Impressions(s) / ED Diagnoses   Final diagnoses:  Allergic dermatitis    New Prescriptions New Prescriptions   FAMOTIDINE (PEPCID) 20 MG TABLET    Take 1 tablet (20 mg total) by mouth 2 (two) times daily.   PREDNISONE (DELTASONE) 10 MG TABLET    Take 2 tablets (20 mg total) by mouth daily.     Milton Ferguson, MD 03/23/17 1056

## 2017-03-23 NOTE — Discharge Instructions (Signed)
Take Benadryl 25 mg every 4-6 hours for rash and itching. Follow-up with Dr. Anastasio Champion or another family md in 1-2 weeks if not improving.

## 2017-03-23 NOTE — ED Triage Notes (Signed)
Pt reports itching with rash all over, but mostly to back x 1 week.

## 2017-04-19 ENCOUNTER — Emergency Department (HOSPITAL_COMMUNITY)
Admission: EM | Admit: 2017-04-19 | Discharge: 2017-04-19 | Disposition: A | Discharge status: Home / Self Care | Payer: BLUE CROSS/BLUE SHIELD

## 2017-04-19 DIAGNOSIS — Z79899 Other long term (current) drug therapy: Secondary | ICD-10-CM | POA: Diagnosis not present

## 2017-04-19 DIAGNOSIS — Z8541 Personal history of malignant neoplasm of cervix uteri: Secondary | ICD-10-CM | POA: Diagnosis not present

## 2017-04-19 DIAGNOSIS — R21 Rash and other nonspecific skin eruption: Secondary | ICD-10-CM | POA: Insufficient documentation

## 2017-04-19 DIAGNOSIS — J45909 Unspecified asthma, uncomplicated: Secondary | ICD-10-CM | POA: Diagnosis not present

## 2017-04-19 DIAGNOSIS — F17200 Nicotine dependence, unspecified, uncomplicated: Secondary | ICD-10-CM | POA: Insufficient documentation

## 2017-04-19 DIAGNOSIS — Z7982 Long term (current) use of aspirin: Secondary | ICD-10-CM | POA: Insufficient documentation

## 2017-04-19 NOTE — ED Notes (Signed)
Called pt for triage as she was walking out, pt stated she needs to pick up her Grandchildren and take them to daycare, states she will return later

## 2017-04-20 ENCOUNTER — Encounter (HOSPITAL_COMMUNITY): Payer: Self-pay

## 2017-04-20 ENCOUNTER — Emergency Department (HOSPITAL_COMMUNITY)
Admission: EM | Admit: 2017-04-20 | Discharge: 2017-04-20 | Disposition: A | Discharge status: Home / Self Care | Payer: BLUE CROSS/BLUE SHIELD | Attending: Emergency Medicine | Admitting: Emergency Medicine

## 2017-04-20 DIAGNOSIS — R21 Rash and other nonspecific skin eruption: Secondary | ICD-10-CM

## 2017-04-20 MED ORDER — PREDNISONE 50 MG PO TABS
60.0000 mg | ORAL_TABLET | Freq: Once | ORAL | Status: AC
  Administered 2017-04-20: 03:00:00 60 mg via ORAL
  Filled 2017-04-20: qty 1

## 2017-04-20 MED ORDER — PREDNISONE 50 MG PO TABS: 50.0000 mg | ORAL_TABLET | Freq: Every day | ORAL | 0 refills | Status: DC

## 2017-04-20 MED ORDER — FAMOTIDINE 20 MG PO TABS
20.0000 mg | ORAL_TABLET | Freq: Once | ORAL | Status: AC
  Administered 2017-04-20: 20 mg via ORAL
  Filled 2017-04-20: qty 1

## 2017-04-20 MED ORDER — DIPHENHYDRAMINE HCL 25 MG PO CAPS
50.0000 mg | ORAL_CAPSULE | Freq: Once | ORAL | Status: AC
  Administered 2017-04-20: 50 mg via ORAL
  Filled 2017-04-20: qty 2

## 2017-04-20 NOTE — ED Provider Notes (Signed)
[facility] DEPT Provider Note   CSN: [id number] Arrival date & time: 04/19/17  2354     History   Chief Complaint Chief Complaint  Patient presents with  . Rash    HPI Marissa Frank is a 46 y.o. female.  The history is provided by the patient.  She complains of a generalized rash which is been present for about one month. It consists of bumps which are very itchy. she denies fever, chills, sweats. She would seen in the ED and given a short course of prednisone which gave temporary relief. She's been taking diphenhydramine which gives minimal relief. She states feels like something is biting her. Rash does extend into the perianal area and vulvar area. She denies any unusual exposure and denies any new medications. She denies difficulty breathing or swallowing.  Past Medical History:  Diagnosis Date  . Asthma   . Cancer ([place])    cervical    There are no active problems to display for this patient.   Past Surgical History:  Procedure Laterality Date  . BREAST SURGERY    . CERVICAL CONE BIOPSY    . TUBAL LIGATION      OB History    Gravida Para Term Preterm AB Living   3 2 2   1      SAB TAB Ectopic Multiple Live Births     1             Home Medications    Prior to Admission medications   Medication Sig Start Date End Date Taking? Authorizing Provider  albuterol (PROVENTIL) (2.5 MG/3ML) 0.083% nebulizer solution Take 3 mLs (2.5 mg total) by nebulization every 6 (six) hours as needed for wheezing or shortness of breath. 07/14/16   [name], MD  aspirin 325 MG tablet Take 325 mg by mouth daily.    [provider]  Cyanocobalamin (VITAMIN B-12 PO) Take 1 tablet by mouth daily.    [provider]  famotidine (PEPCID) 20 MG tablet Take 1 tablet (20 mg total) by mouth 2 (two) times daily. 03/23/17   Milton Ferguson, MD  meloxicam (MOBIC) 15 MG tablet Take 1 tablet (15 mg total) by mouth daily. 03/05/17   Orpah Greek, MD    predniSONE (DELTASONE) 10 MG tablet Take 2 tablets (20 mg total) by mouth daily. 03/23/17   Milton Ferguson, MD    Family History Family History  Problem Relation Age of Onset  . Hypertension Mother     Social History Social History  Substance Use Topics  . Smoking status: Current Every Day Smoker    Packs/day: 0.50  . Smokeless tobacco: Never Used  . Alcohol use Yes     Comment: occ     Allergies   Almond oil; Carrot [daucus carota]; Celery oil; Coconut flavor; and Macadamia nut oil   Review of Systems Review of Systems  All other systems reviewed and are negative.    Physical Exam Updated Vital Signs BP 135/82 (BP Location: Right Arm)   Pulse 62   Temp 98.6 F (37 C) (Oral)   Resp 15   Ht 5\' 1"  (1.549 m)   Wt 42.6 kg (94 lb)   LMP 03/24/2017   SpO2 100%   BMI 17.76 kg/m   Physical Exam  Nursing note and vitals reviewed.  46 year old female, resting comfortably and in no acute distress. Vital signs are normal. Oxygen saturation is 100%, which is normal. Head is normocephalic and atraumatic. PERRLA, EOMI. Oropharynx is  clear. Neck is nontender and supple without adenopathy or JVD. Back is nontender and there is no CVA tenderness. Lungs are clear without rales, wheezes, or rhonchi. Chest is nontender. Heart has regular rate and rhythm without murmur. Abdomen is soft, flat, nontender without masses or hepatosplenomegaly and peristalsis is normoactive. Extremities have no cyanosis or edema, full range of motion is present. Skin is warm and dry. Generalized papular rash is present with some excoriation of the papules. No underlying erythema. No swelling. No urticarial lesions. Rash is present on the trunk, arms, legs, buttocks, vulva. Appearance is nonspecific. Neurologic: Mental status is normal, cranial nerves are intact, there are no motor or sensory deficits.  ED Treatments / Results   Procedures Procedures (including critical care time)  Medications  Ordered in ED Medications  diphenhydrAMINE (BENADRYL) capsule 50 mg (not administered)  famotidine (PEPCID) tablet 20 mg (not administered)  predniSONE (DELTASONE) tablet 60 mg (not administered)     Initial Impression / Assessment and Plan / ED Course  I have reviewed the triage vital signs and the nursing notes.  Rash which is nonspecific in appearance. Old records are reviewed confirming recent ED visit for rash. At that time, was felt to be a contact dermatitis. Current appearance is not that of contact dermatitis. She is discharged with prescription for prednisone and a higher dose than she was given before, and advised to use over-the-counter second-generation antihistamines and H2 blockers. Take diphenhydramine for breakthrough itching. Referred to dermatology for follow-up.  Final Clinical Impressions(s) / ED Diagnoses   Final diagnoses:  Papular rash    New Prescriptions Current Discharge Medication List       Delora Fuel, MD 54/65/68 [phone]

## 2017-04-20 NOTE — Discharge Instructions (Signed)
It is not clear what is causing your rash. Please take either loratadine (Claritin) or cetirizine (Zyrtec) once a day. Take famotidine (Pepcid) or ranitidine (Zantac) twice a day. Take diphenhydramine (Benadryl) as needed for break-through itching.

## 2017-04-20 NOTE — ED Triage Notes (Signed)
Pt reports itchy rash x 1 month, also states lower back pain but more concerned with the rash.

## 2017-06-18 ENCOUNTER — Emergency Department (HOSPITAL_COMMUNITY)
Admission: EM | Admit: 2017-06-18 | Discharge: 2017-06-18 | Disposition: A | Discharge status: Home / Self Care | Payer: BLUE CROSS/BLUE SHIELD | Attending: Emergency Medicine | Admitting: Emergency Medicine

## 2017-06-18 ENCOUNTER — Encounter (HOSPITAL_COMMUNITY): Payer: Self-pay | Admitting: Emergency Medicine

## 2017-06-18 ENCOUNTER — Other Ambulatory Visit: Payer: Self-pay

## 2017-06-18 DIAGNOSIS — Z7982 Long term (current) use of aspirin: Secondary | ICD-10-CM | POA: Diagnosis not present

## 2017-06-18 DIAGNOSIS — B86 Scabies: Secondary | ICD-10-CM | POA: Diagnosis not present

## 2017-06-18 DIAGNOSIS — Z79899 Other long term (current) drug therapy: Secondary | ICD-10-CM | POA: Diagnosis not present

## 2017-06-18 DIAGNOSIS — J45909 Unspecified asthma, uncomplicated: Secondary | ICD-10-CM | POA: Insufficient documentation

## 2017-06-18 DIAGNOSIS — F17200 Nicotine dependence, unspecified, uncomplicated: Secondary | ICD-10-CM | POA: Insufficient documentation

## 2017-06-18 DIAGNOSIS — R21 Rash and other nonspecific skin eruption: Secondary | ICD-10-CM | POA: Diagnosis present

## 2017-06-18 DIAGNOSIS — Z8541 Personal history of malignant neoplasm of cervix uteri: Secondary | ICD-10-CM | POA: Diagnosis not present

## 2017-06-18 MED ORDER — PREDNISONE 50 MG PO TABS: 50.0000 mg | ORAL_TABLET | Freq: Every day | ORAL | 0 refills | Status: DC

## 2017-06-18 MED ORDER — PERMETHRIN 5 % EX CREA: TOPICAL_CREAM | CUTANEOUS | 0 refills | Status: DC

## 2017-06-18 NOTE — ED Triage Notes (Signed)
PT c/o continued rash x1 month unrelieved by medications given the last ED visit per pt.

## 2017-06-18 NOTE — ED Provider Notes (Signed)
[facility] EMERGENCY DEPARTMENT Provider Note   CSN: [id number] Arrival date & time: 06/18/17  4098     History   Chief Complaint Chief Complaint  Patient presents with  . Rash    HPI Marissa Frank is a 46 y.o. female.  The history is provided by the patient. No language interpreter was used.  Rash   This is a recurrent problem. The problem has been gradually worsening. The problem is associated with nothing. There has been no fever. The rash is present on the abdomen, trunk, left upper leg, left lower leg, right upper leg, right lower leg, right arm and left arm. The pain is moderate. The pain has been constant since onset. She has tried nothing for the symptoms. The treatment provided no relief.   Pt complains of an itching rash.  Pt had scabies earlier this year.  Pt reports rash feels the same.  Past Medical History:  Diagnosis Date  . Asthma   . Cancer ([place])    cervical    There are no active problems to display for this patient.   Past Surgical History:  Procedure Laterality Date  . BREAST SURGERY    . CERVICAL CONE BIOPSY    . TUBAL LIGATION      OB History    Gravida Para Term Preterm AB Living   3 2 2   1      SAB TAB Ectopic Multiple Live Births     1             Home Medications    Prior to Admission medications   Medication Sig Start Date End Date Taking? Authorizing Provider  albuterol (PROVENTIL) (2.5 MG/3ML) 0.083% nebulizer solution Take 3 mLs (2.5 mg total) by nebulization every 6 (six) hours as needed for wheezing or shortness of breath. 07/14/16   [name], MD  aspirin 325 MG tablet Take 325 mg by mouth daily.    [provider]  Cyanocobalamin (VITAMIN B-12 PO) Take 1 tablet by mouth daily.    [provider]  famotidine (PEPCID) 20 MG tablet Take 1 tablet (20 mg total) by mouth 2 (two) times daily. 03/23/17   Milton Ferguson, MD  permethrin (ELIMITE) 5 % cream Apply to affected area once 06/18/17   Fransico Meadow, PA-C  predniSONE (DELTASONE) 50 MG tablet Take 1 tablet (50 mg total) daily by mouth. 06/18/17   Fransico Meadow, PA-C    Family History Family History  Problem Relation Age of Onset  . Hypertension Mother     Social History Social History   Tobacco Use  . Smoking status: Current Every Day Smoker    Packs/day: 0.50  . Smokeless tobacco: Never Used  Substance Use Topics  . Alcohol use: Yes    Comment: occ  . Drug use: No     Allergies   Almond oil; Carrot [daucus carota]; Celery oil; Coconut flavor; and Macadamia nut oil   Review of Systems Review of Systems  Skin: Positive for rash.  All other systems reviewed and are negative.    Physical Exam Updated Vital Signs BP (!) 139/102 (BP Location: Right Arm)   Pulse 66   Temp 98.6 F (37 C) (Oral)   Resp 18   Ht 5\' 1"  (1.549 m)   Wt 43.1 kg (95 lb)   LMP 06/13/2017   SpO2 99%   BMI 17.95 kg/m   Physical Exam  Constitutional: She is oriented to person, place, and time. She appears  well-developed and well-nourished.  HENT:  Head: Normocephalic.  Eyes: EOM are normal.  Neck: Normal range of motion.  Pulmonary/Chest: Effort normal.  Abdominal: She exhibits no distension.  Musculoskeletal: Normal range of motion.  Neurological: She is alert and oriented to person, place, and time.  Skin:  Multiple areas that look like burrows,    Psychiatric: She has a normal mood and affect.  Nursing note and vitals reviewed.    ED Treatments / Results  Labs (all labs ordered are listed, but only abnormal results are displayed) Labs Reviewed - No data to display  EKG  EKG Interpretation None       Radiology No results found.  Procedures Procedures (including critical care time)  Medications Ordered in ED Medications - No data to display   Initial Impression / Assessment and Plan / ED Course  I have reviewed the triage vital signs and the nursing notes.  Pertinent labs & imaging results that  were available during my care of the patient were reviewed by me and considered in my medical decision making (see chart for details).     Pt also reports exposure to a lot of dust at work.  Pt works with yarn.  I will treat with elemite and prednisone    Final Clinical Impressions(s) / ED Diagnoses   Final diagnoses:  Rash  Scabies    ED Discharge Orders        Ordered    predniSONE (DELTASONE) 50 MG tablet  Daily     06/18/17 0900    permethrin (ELIMITE) 5 % cream     06/18/17 0900    An After Visit Summary was printed and given to the patient. Pt advised if symptoms persist to follow up with dermatology for definitive evaluation   Sidney Ace 06/18/17 1610    Virgel Manifold, MD 06/18/17 [phone]

## 2017-06-24 ENCOUNTER — Encounter (HOSPITAL_COMMUNITY): Payer: Self-pay | Admitting: Emergency Medicine

## 2017-06-24 ENCOUNTER — Other Ambulatory Visit: Payer: Self-pay

## 2017-06-24 ENCOUNTER — Emergency Department (HOSPITAL_COMMUNITY)
Admission: EM | Admit: 2017-06-24 | Discharge: 2017-06-24 | Disposition: A | Discharge status: Home / Self Care | Payer: BLUE CROSS/BLUE SHIELD | Attending: Emergency Medicine | Admitting: Emergency Medicine

## 2017-06-24 DIAGNOSIS — Z79899 Other long term (current) drug therapy: Secondary | ICD-10-CM | POA: Insufficient documentation

## 2017-06-24 DIAGNOSIS — F17200 Nicotine dependence, unspecified, uncomplicated: Secondary | ICD-10-CM | POA: Insufficient documentation

## 2017-06-24 DIAGNOSIS — Y929 Unspecified place or not applicable: Secondary | ICD-10-CM | POA: Insufficient documentation

## 2017-06-24 DIAGNOSIS — Z7982 Long term (current) use of aspirin: Secondary | ICD-10-CM | POA: Insufficient documentation

## 2017-06-24 DIAGNOSIS — Y939 Activity, unspecified: Secondary | ICD-10-CM | POA: Diagnosis not present

## 2017-06-24 DIAGNOSIS — J45909 Unspecified asthma, uncomplicated: Secondary | ICD-10-CM | POA: Insufficient documentation

## 2017-06-24 DIAGNOSIS — Y999 Unspecified external cause status: Secondary | ICD-10-CM | POA: Insufficient documentation

## 2017-06-24 DIAGNOSIS — X58XXXA Exposure to other specified factors, initial encounter: Secondary | ICD-10-CM | POA: Diagnosis not present

## 2017-06-24 DIAGNOSIS — T24211A Burn of second degree of right thigh, initial encounter: Secondary | ICD-10-CM | POA: Diagnosis not present

## 2017-06-24 DIAGNOSIS — T24011A Burn of unspecified degree of right thigh, initial encounter: Secondary | ICD-10-CM | POA: Diagnosis present

## 2017-06-24 MED ORDER — IBUPROFEN 400 MG PO TABS: 400.0000 mg | ORAL_TABLET | Freq: Four times a day (QID) | ORAL | 0 refills | Status: DC | PRN

## 2017-06-24 MED ORDER — IBUPROFEN 400 MG PO TABS
400.0000 mg | ORAL_TABLET | Freq: Once | ORAL | Status: AC
  Administered 2017-06-24: 400 mg via ORAL
  Filled 2017-06-24: qty 1

## 2017-06-24 MED ORDER — CEPHALEXIN 500 MG PO CAPS: 500.0000 mg | ORAL_CAPSULE | Freq: Four times a day (QID) | ORAL | 0 refills | Status: DC

## 2017-06-24 MED ORDER — ACETAMINOPHEN 325 MG PO TABS
650.0000 mg | ORAL_TABLET | Freq: Once | ORAL | Status: AC
  Administered 2017-06-24: 650 mg via ORAL
  Filled 2017-06-24: qty 2

## 2017-06-24 MED ORDER — TRAMADOL HCL 50 MG PO TABS: 50.0000 mg | ORAL_TABLET | Freq: Four times a day (QID) | ORAL | 0 refills | Status: DC | PRN

## 2017-06-24 NOTE — Discharge Instructions (Signed)
Your vital signs are within normal limits.  Your examination shows your burn is healing.  We will cover with Keflex for possible infection.  Please use 400 mg of ibuprofen and 500 mg of Tylenol every 6 hours for pain and discomfort.  Use Ultram for more severe pain. This medication may cause drowsiness. Please do not drink, drive, or participate in activity that requires concentration while taking this medication.  Use the dressing/wrap until healed so that clothing does not irritate your burn area.
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
## 2017-07-02 ENCOUNTER — Emergency Department (HOSPITAL_COMMUNITY)
Admission: EM | Admit: 2017-07-02 | Discharge: 2017-07-02 | Disposition: A | Discharge status: Home / Self Care | Payer: BLUE CROSS/BLUE SHIELD | Attending: Emergency Medicine | Admitting: Emergency Medicine

## 2017-07-02 ENCOUNTER — Other Ambulatory Visit: Payer: Self-pay

## 2017-07-02 ENCOUNTER — Encounter (HOSPITAL_COMMUNITY): Payer: Self-pay | Admitting: *Deleted

## 2017-07-02 DIAGNOSIS — Z7982 Long term (current) use of aspirin: Secondary | ICD-10-CM | POA: Insufficient documentation

## 2017-07-02 DIAGNOSIS — Z8541 Personal history of malignant neoplasm of cervix uteri: Secondary | ICD-10-CM | POA: Insufficient documentation

## 2017-07-02 DIAGNOSIS — Z79899 Other long term (current) drug therapy: Secondary | ICD-10-CM | POA: Diagnosis not present

## 2017-07-02 DIAGNOSIS — F17200 Nicotine dependence, unspecified, uncomplicated: Secondary | ICD-10-CM | POA: Diagnosis not present

## 2017-07-02 DIAGNOSIS — R21 Rash and other nonspecific skin eruption: Secondary | ICD-10-CM | POA: Insufficient documentation

## 2017-07-02 DIAGNOSIS — N898 Other specified noninflammatory disorders of vagina: Secondary | ICD-10-CM | POA: Diagnosis not present

## 2017-07-02 LAB — WET PREP, GENITAL
Clue Cells Wet Prep HPF POC: NONE SEEN
Sperm: NONE SEEN
Trich, Wet Prep: NONE SEEN
YEAST WET PREP: NONE SEEN

## 2017-07-02 MED ORDER — HYDROXYZINE HCL 50 MG PO TABS: 50.0000 mg | ORAL_TABLET | Freq: Three times a day (TID) | ORAL | 0 refills | Status: DC | PRN

## 2017-07-02 MED ORDER — TRIAMCINOLONE ACETONIDE 0.1 % EX CREA: 1.0000 "application " | TOPICAL_CREAM | Freq: Two times a day (BID) | CUTANEOUS | 0 refills | Status: DC

## 2017-07-02 NOTE — ED Provider Notes (Signed)
[facility] EMERGENCY DEPARTMENT Provider Note   CSN: [id number] Arrival date & time: 07/02/17  0217     History   Chief Complaint Chief Complaint  Patient presents with  . Vaginal Itching    HPI Marissa Frank is a 46 y.o. female.  Patient presents to the ER with complaints of rash all over.  She has been seen multiple times for this.  Patient reports that she was treated for allergic reaction first, then scabies.  Since then the rash has persisted without improvement.  She now feels like the rash is in her vagina and rectum.  It feels like something in there eating her.  She is concerned that the scabies have infiltrated her vagina and rectum.    Vaginal Itching     Past Medical History:  Diagnosis Date  . Asthma   . Cancer ([place])    cervical    There are no active problems to display for this patient.   Past Surgical History:  Procedure Laterality Date  . BREAST SURGERY    . CERVICAL CONE BIOPSY    . TUBAL LIGATION      OB History    Gravida Para Term Preterm AB Living   3 2 2   1      SAB TAB Ectopic Multiple Live Births     1             Home Medications    Prior to Admission medications   Medication Sig Start Date End Date Taking? Authorizing Provider  albuterol (PROVENTIL) (2.5 MG/3ML) 0.083% nebulizer solution Take 3 mLs (2.5 mg total) by nebulization every 6 (six) hours as needed for wheezing or shortness of breath. 07/14/16   [name], MD  aspirin 325 MG tablet Take 325 mg by mouth daily.    [provider]  cephALEXin (KEFLEX) 500 MG capsule Take 1 capsule (500 mg total) by mouth 4 (four) times daily. 06/24/17   Lily Kocher, PA-C  Cyanocobalamin (VITAMIN B-12 PO) Take 1 tablet by mouth daily.    [provider]  famotidine (PEPCID) 20 MG tablet Take 1 tablet (20 mg total) by mouth 2 (two) times daily. 03/23/17   Milton Ferguson, MD  hydrOXYzine (ATARAX/VISTARIL) 50 MG tablet Take 1 tablet (50 mg total) by mouth  every 8 (eight) hours as needed for itching. 07/02/17   Orpah Greek, MD  ibuprofen (ADVIL,MOTRIN) 400 MG tablet Take 1 tablet (400 mg total) by mouth every 6 (six) hours as needed. 06/24/17   Lily Kocher, PA-C  permethrin (ELIMITE) 5 % cream Apply to affected area once 06/18/17   Fransico Meadow, PA-C  predniSONE (DELTASONE) 50 MG tablet Take 1 tablet (50 mg total) daily by mouth. 06/18/17   Fransico Meadow, PA-C  traMADol (ULTRAM) 50 MG tablet Take 1 tablet (50 mg total) by mouth every 6 (six) hours as needed. 06/24/17   Lily Kocher, PA-C  triamcinolone cream (KENALOG) 0.1 % Apply 1 application topically 2 (two) times daily. 07/02/17   Orpah Greek, MD    Family History Family History  Problem Relation Age of Onset  . Hypertension Mother     Social History Social History   Tobacco Use  . Smoking status: Current Every Day Smoker    Packs/day: 0.50  . Smokeless tobacco: Never Used  Substance Use Topics  . Alcohol use: Yes    Comment: occ  . Drug use: No     Allergies   Almond oil; Carrot [daucus  carota]; Celery oil; Coconut flavor; and Macadamia nut oil   Review of Systems Review of Systems  Skin: Positive for rash.  All other systems reviewed and are negative.    Physical Exam Updated Vital Signs BP 111/67 (BP Location: Left Arm)   Pulse 83   Temp 98.7 F (37.1 C) (Oral)   Resp 16   Ht 5\' 1"  (1.549 m)   Wt 43.1 kg (95 lb)   LMP 06/13/2017   SpO2 100%   BMI 17.95 kg/m   Physical Exam  Constitutional: She is oriented to person, place, and time. She appears well-developed and well-nourished. No distress.  HENT:  Head: Normocephalic and atraumatic.  Right Ear: Hearing normal.  Left Ear: Hearing normal.  Nose: Nose normal.  Mouth/Throat: Oropharynx is clear and moist and mucous membranes are normal.  Eyes: Conjunctivae and EOM are normal. Pupils are equal, round, and reactive to light.  Neck: Normal range of motion. Neck supple.    Cardiovascular: Regular rhythm, S1 normal and S2 normal. Exam reveals no gallop and no friction rub.  No murmur heard. Pulmonary/Chest: Effort normal and breath sounds normal. No respiratory distress. She exhibits no tenderness.  Abdominal: Soft. Normal appearance and bowel sounds are normal. There is no hepatosplenomegaly. There is no tenderness. There is no rebound, no guarding, no tenderness at McBurney's point and negative Murphy's sign. No hernia.  Genitourinary: Vagina normal. Pelvic exam was performed with patient prone. There is lesion (Linear excoriation upper outer portion of the labia minora on the left) on the left labia. Cervix exhibits no motion tenderness, no discharge and no friability.  Musculoskeletal: Normal range of motion.  Neurological: She is alert and oriented to person, place, and time. She has normal strength. No cranial nerve deficit or sensory deficit. Coordination normal. GCS eye subscore is 4. GCS verbal subscore is 5. GCS motor subscore is 6.  Skin: Skin is warm, dry and intact. No rash (Multiple areas of scabbing and healing areas on skin with focal decreased pigmentation) noted. No cyanosis.  Psychiatric: She has a normal mood and affect. Her speech is normal and behavior is normal. Thought content normal.  Nursing note and vitals reviewed.    ED Treatments / Results  Labs (all labs ordered are listed, but only abnormal results are displayed) Labs Reviewed  WET PREP, GENITAL  GC/CHLAMYDIA PROBE AMP ([place]) NOT AT [facility]    EKG  EKG Interpretation None       Radiology No results found.  Procedures Procedures (including critical care time)  Medications Ordered in ED Medications - No data to display   Initial Impression / Assessment and Plan / ED Course  I have reviewed the triage vital signs and the nursing notes.  Pertinent labs & imaging results that were available during my care of the patient were reviewed by me and considered in my  medical decision making (see chart for details).     Patient presents with persistent rash.  She has been seen multiple times for this in the past.  Patient has been treated with prednisone multiple times as well as Elimite.  She has had no improvement.  I do not see a primary lesion today.  When I asked her to point out areas where the rash is just starting shot, she points to various areas on her skin including nevi and completely clear areas.  All I see today is excoriation and healing areas from where she has scratched and picked at her skin.  Consider  psychogenic causes because of this.  Final Clinical Impressions(s) / ED Diagnoses   Final diagnoses:  Rash    ED Discharge Orders        Ordered    triamcinolone cream (KENALOG) 0.1 %  2 times daily     07/02/17 0337    hydrOXYzine (ATARAX/VISTARIL) 50 MG tablet  Every 8 hours PRN     07/02/17 0337       Orpah Greek, MD 07/02/17 [phone]

## 2017-07-02 NOTE — ED Triage Notes (Signed)
Pt c/o vaginal itching; pt states she was recently treated for scabies

## 2017-07-03 LAB — GC/CHLAMYDIA PROBE AMP (~~LOC~~) NOT AT ARMC
Chlamydia: NEGATIVE
NEISSERIA GONORRHEA: NEGATIVE

## 2017-09-02 ENCOUNTER — Encounter (HOSPITAL_COMMUNITY): Payer: Self-pay | Admitting: *Deleted

## 2017-09-02 ENCOUNTER — Other Ambulatory Visit: Payer: Self-pay

## 2017-09-02 DIAGNOSIS — M5431 Sciatica, right side: Secondary | ICD-10-CM | POA: Diagnosis not present

## 2017-09-02 DIAGNOSIS — F17200 Nicotine dependence, unspecified, uncomplicated: Secondary | ICD-10-CM | POA: Insufficient documentation

## 2017-09-02 DIAGNOSIS — Z5321 Procedure and treatment not carried out due to patient leaving prior to being seen by health care provider: Secondary | ICD-10-CM

## 2017-09-02 DIAGNOSIS — M5136 Other intervertebral disc degeneration, lumbar region: Secondary | ICD-10-CM | POA: Insufficient documentation

## 2017-09-02 DIAGNOSIS — Z7982 Long term (current) use of aspirin: Secondary | ICD-10-CM | POA: Insufficient documentation

## 2017-09-02 DIAGNOSIS — J45909 Unspecified asthma, uncomplicated: Secondary | ICD-10-CM | POA: Insufficient documentation

## 2017-09-02 DIAGNOSIS — Z8541 Personal history of malignant neoplasm of cervix uteri: Secondary | ICD-10-CM | POA: Insufficient documentation

## 2017-09-02 DIAGNOSIS — Z79899 Other long term (current) drug therapy: Secondary | ICD-10-CM | POA: Diagnosis not present

## 2017-09-02 DIAGNOSIS — M545 Low back pain: Secondary | ICD-10-CM | POA: Insufficient documentation

## 2017-09-02 NOTE — ED Triage Notes (Signed)
Pt reports lower back pain for the past 2 weeks. Pt says she has tried everything w/ no relief.

## 2017-09-03 ENCOUNTER — Other Ambulatory Visit: Payer: Self-pay

## 2017-09-03 ENCOUNTER — Emergency Department (HOSPITAL_COMMUNITY)
Admission: EM | Admit: 2017-09-03 | Discharge: 2017-09-03 | Disposition: A | Discharge status: Home / Self Care | Payer: BLUE CROSS/BLUE SHIELD | Attending: Emergency Medicine | Admitting: Emergency Medicine

## 2017-09-03 ENCOUNTER — Emergency Department (HOSPITAL_COMMUNITY)
Admission: EM | Admit: 2017-09-03 | Discharge: 2017-09-03 | Disposition: A | Discharge status: Home / Self Care | Payer: BLUE CROSS/BLUE SHIELD | Source: Home / Self Care

## 2017-09-03 ENCOUNTER — Encounter (HOSPITAL_COMMUNITY): Payer: Self-pay | Admitting: Emergency Medicine

## 2017-09-03 DIAGNOSIS — M5431 Sciatica, right side: Secondary | ICD-10-CM

## 2017-09-03 DIAGNOSIS — M5136 Other intervertebral disc degeneration, lumbar region: Secondary | ICD-10-CM

## 2017-09-03 MED ORDER — DICLOFENAC SODIUM 75 MG PO TBEC: 75.0000 mg | DELAYED_RELEASE_TABLET | Freq: Two times a day (BID) | ORAL | 0 refills | Status: DC

## 2017-09-03 MED ORDER — KETOROLAC TROMETHAMINE 10 MG PO TABS
10.0000 mg | ORAL_TABLET | Freq: Once | ORAL | Status: AC
  Administered 2017-09-03: 10 mg via ORAL
  Filled 2017-09-03: qty 1

## 2017-09-03 MED ORDER — DEXAMETHASONE 4 MG PO TABS: 4.0000 mg | ORAL_TABLET | Freq: Two times a day (BID) | ORAL | 0 refills | Status: DC

## 2017-09-03 MED ORDER — ONDANSETRON HCL 4 MG PO TABS
4.0000 mg | ORAL_TABLET | Freq: Once | ORAL | Status: AC
Start: 2017-09-03 — End: 2017-09-03
  Administered 2017-09-03: 4 mg via ORAL
  Filled 2017-09-03: qty 1

## 2017-09-03 MED ORDER — CYCLOBENZAPRINE HCL 10 MG PO TABS: 10.0000 mg | ORAL_TABLET | Freq: Three times a day (TID) | ORAL | 0 refills | Status: DC

## 2017-09-03 MED ORDER — DEXAMETHASONE SODIUM PHOSPHATE 10 MG/ML IJ SOLN
10.0000 mg | Freq: Once | INTRAMUSCULAR | Status: AC
  Administered 2017-09-03: 10 mg via INTRAMUSCULAR
  Filled 2017-09-03: qty 1

## 2017-09-03 NOTE — Discharge Instructions (Signed)
Review of your previous imaging and today's examination suggest sciatica and degenerative disc disease problems.  Please continue to rest her back as much as possible.  Please use a heating pad when at rest.  Use Decadron 2 times daily with food.  Diclofenac 2 times daily with food, and Flexeril 3 times daily for spasm pain.  Flexeril may cause drowsiness.  Please do not drive, operate machinery, drink alcohol, or participate in activities requiring concentration when taking this medication.  Please see Dr.Xu as soon as possible for orthopedic evaluation concerning your back and back pain.

## 2017-09-03 NOTE — ED Notes (Signed)
Per registration pt left facility without being seen by provider.

## 2017-09-03 NOTE — ED Triage Notes (Signed)
Pt c/o lower back pain that radiates into bilateral legs. Pt denies any injury.

## 2017-09-03 NOTE — ED Provider Notes (Signed)
[facility] EMERGENCY DEPARTMENT Provider Note   CSN: [id number] Arrival date & time: 09/03/17  0503     History   Chief Complaint Chief Complaint  Patient presents with  . Back Pain    HPI Marissa Frank is a 47 y.o. female.  The history is provided by the patient.  Back Pain   This is a chronic problem. The current episode started more than 1 week ago. The problem occurs daily. The problem has been gradually worsening. The pain is associated with no known injury. The pain is present in the lumbar spine. The quality of the pain is described as aching (pulling type pain). The pain radiates to the right thigh. The pain is moderate. The symptoms are aggravated by bending (standing). The pain is the same all the time. Associated symptoms include numbness and tingling. Pertinent negatives include no chest pain, no fever, no abdominal pain, no bowel incontinence, no perianal numbness, no bladder incontinence and no dysuria. She has tried heat and NSAIDs (OTC rub) for the symptoms.    Past Medical History:  Diagnosis Date  . Asthma   . Cancer ([place])    cervical    There are no active problems to display for this patient.   Past Surgical History:  Procedure Laterality Date  . BREAST SURGERY    . CERVICAL CONE BIOPSY    . TUBAL LIGATION      OB History    Gravida Para Term Preterm AB Living   3 2 2   1      SAB TAB Ectopic Multiple Live Births     1             Home Medications    Prior to Admission medications   Medication Sig Start Date End Date Taking? Authorizing Provider  albuterol (PROVENTIL) (2.5 MG/3ML) 0.083% nebulizer solution Take 3 mLs (2.5 mg total) by nebulization every 6 (six) hours as needed for wheezing or shortness of breath. 07/14/16   [name], MD  aspirin 325 MG tablet Take 325 mg by mouth daily.    [provider]  cephALEXin (KEFLEX) 500 MG capsule Take 1 capsule (500 mg total) by mouth 4 (four) times daily. 06/24/17   Lily Kocher, PA-C  Cyanocobalamin (VITAMIN B-12 PO) Take 1 tablet by mouth daily.    [provider]  famotidine (PEPCID) 20 MG tablet Take 1 tablet (20 mg total) by mouth 2 (two) times daily. 03/23/17   Milton Ferguson, MD  hydrOXYzine (ATARAX/VISTARIL) 50 MG tablet Take 1 tablet (50 mg total) by mouth every 8 (eight) hours as needed for itching. 07/02/17   Orpah Greek, MD  ibuprofen (ADVIL,MOTRIN) 400 MG tablet Take 1 tablet (400 mg total) by mouth every 6 (six) hours as needed. 06/24/17   Lily Kocher, PA-C  permethrin (ELIMITE) 5 % cream Apply to affected area once 06/18/17   Fransico Meadow, PA-C  predniSONE (DELTASONE) 50 MG tablet Take 1 tablet (50 mg total) daily by mouth. 06/18/17   Fransico Meadow, PA-C  traMADol (ULTRAM) 50 MG tablet Take 1 tablet (50 mg total) by mouth every 6 (six) hours as needed. 06/24/17   Lily Kocher, PA-C  triamcinolone cream (KENALOG) 0.1 % Apply 1 application topically 2 (two) times daily. 07/02/17   Orpah Greek, MD    Family History Family History  Problem Relation Age of Onset  . Hypertension Mother     Social History Social History   Tobacco Use  .  Smoking status: Current Every Day Smoker    Packs/day: 0.50  . Smokeless tobacco: Never Used  Substance Use Topics  . Alcohol use: Yes    Comment: occ  . Drug use: No     Allergies   Almond oil; Carrot [daucus carota]; Celery oil; Coconut flavor; and Macadamia nut oil   Review of Systems Review of Systems  Constitutional: Negative for activity change and fever.       All ROS Neg except as noted in HPI  HENT: Negative for nosebleeds.   Eyes: Negative for photophobia and discharge.  Respiratory: Negative for cough, shortness of breath and wheezing.   Cardiovascular: Negative for chest pain and palpitations.  Gastrointestinal: Negative for abdominal pain, blood in stool and bowel incontinence.  Genitourinary: Negative for bladder incontinence, dysuria, frequency and  hematuria.  Musculoskeletal: Positive for back pain. Negative for arthralgias and neck pain.  Skin: Negative.   Neurological: Positive for tingling and numbness. Negative for dizziness, seizures and speech difficulty.  Psychiatric/Behavioral: Negative for confusion and hallucinations.     Physical Exam Updated Vital Signs BP 128/74 (BP Location: Right Arm)   Pulse 74   Temp 98.4 F (36.9 C) (Oral)   Resp (!) 22   Ht 5\' 1"  (1.549 m)   Wt 43.1 kg (95 lb)   SpO2 100%   BMI 17.95 kg/m   Physical Exam  Constitutional: She is oriented to person, place, and time. She appears well-developed and well-nourished.  Non-toxic appearance.  HENT:  Head: Normocephalic.  Right Ear: Tympanic membrane and external ear normal.  Left Ear: Tympanic membrane and external ear normal.  Eyes: EOM and lids are normal. Pupils are equal, round, and reactive to light.  Neck: Normal range of motion. Neck supple. Carotid bruit is not present.  Cardiovascular: Normal rate, regular rhythm, normal heart sounds, intact distal pulses and normal pulses.  Pulmonary/Chest: Breath sounds normal. No respiratory distress.  Abdominal: Soft. Bowel sounds are normal. There is no tenderness. There is no guarding.  Musculoskeletal:       Lumbar back: She exhibits decreased range of motion, pain and spasm.  Pain with attempted ROM of the back. Pain with SLR on the right.  Lymphadenopathy:       Head (right side): No submandibular adenopathy present.       Head (left side): No submandibular adenopathy present.    She has no cervical adenopathy.  Neurological: She is alert and oriented to person, place, and time. She has normal strength. No cranial nerve deficit or sensory deficit.  Skin: Skin is warm and dry.  Psychiatric: She has a normal mood and affect. Her speech is normal.  Nursing note and vitals reviewed.    ED Treatments / Results  Labs (all labs ordered are listed, but only abnormal results are  displayed) Labs Reviewed - No data to display  EKG  EKG Interpretation None       Radiology No results found.  Procedures Procedures (including critical care time)  Medications Ordered in ED Medications - No data to display   Initial Impression / Assessment and Plan / ED Course  I have reviewed the triage vital signs and the nursing notes.  Pertinent labs & imaging results that were available during my care of the patient were reviewed by me and considered in my medical decision making (see chart for details).       Final Clinical Impressions(s) / ED Diagnoses MDM  I have reviewed the previous records as well as  the imaging studies.  On today's examination the patient has pain of the lower back with straight leg raise.  She has decreased range of motion as well as decrease in her walking related to back pain.  There is no evidence for cauda equina or other emergent changes.  Is been no reported list of problems with bowel or bladder.  Review of the previous imaging studies suggest degenerative changes in the lumbar area.  The patient states that she was referred to an orthopedic specialist on her previous visit but did not go.  I have strongly encouraged patient to see orthopedics as soon as possible for evaluation..  Patient will be treated with a muscle relaxer and anti-inflammatory medication.  Patient encouraged to continue using heating pad.  The patient will return to the emergency department if any emergent changes, problems, or concerns.   Final diagnoses:  Sciatica of right side  DDD (degenerative disc disease), lumbar    ED Discharge Orders        Ordered    cyclobenzaprine (FLEXERIL) 10 MG tablet  3 times daily     09/03/17 0909    dexamethasone (DECADRON) 4 MG tablet  2 times daily with meals     09/03/17 0909    diclofenac (VOLTAREN) 75 MG EC tablet  2 times daily     09/03/17 0909       Lily Kocher, PA-C 09/03/17 0539    Virgel Manifold,  MD 09/03/17 1118

## 2017-11-26 ENCOUNTER — Encounter (HOSPITAL_COMMUNITY): Payer: Self-pay | Admitting: Emergency Medicine

## 2017-11-26 ENCOUNTER — Emergency Department (HOSPITAL_COMMUNITY): Payer: BLUE CROSS/BLUE SHIELD

## 2017-11-26 ENCOUNTER — Other Ambulatory Visit: Payer: Self-pay

## 2017-11-26 ENCOUNTER — Emergency Department (HOSPITAL_COMMUNITY)
Admission: EM | Admit: 2017-11-26 | Discharge: 2017-11-26 | Disposition: A | Discharge status: Home / Self Care | Payer: BLUE CROSS/BLUE SHIELD | Attending: Emergency Medicine | Admitting: Emergency Medicine

## 2017-11-26 DIAGNOSIS — K625 Hemorrhage of anus and rectum: Secondary | ICD-10-CM | POA: Diagnosis present

## 2017-11-26 DIAGNOSIS — K529 Noninfective gastroenteritis and colitis, unspecified: Secondary | ICD-10-CM | POA: Insufficient documentation

## 2017-11-26 DIAGNOSIS — Z8541 Personal history of malignant neoplasm of cervix uteri: Secondary | ICD-10-CM | POA: Insufficient documentation

## 2017-11-26 DIAGNOSIS — R1030 Lower abdominal pain, unspecified: Secondary | ICD-10-CM | POA: Diagnosis not present

## 2017-11-26 DIAGNOSIS — K6289 Other specified diseases of anus and rectum: Secondary | ICD-10-CM | POA: Diagnosis not present

## 2017-11-26 DIAGNOSIS — Z7982 Long term (current) use of aspirin: Secondary | ICD-10-CM | POA: Insufficient documentation

## 2017-11-26 DIAGNOSIS — Z79899 Other long term (current) drug therapy: Secondary | ICD-10-CM | POA: Insufficient documentation

## 2017-11-26 DIAGNOSIS — F17210 Nicotine dependence, cigarettes, uncomplicated: Secondary | ICD-10-CM | POA: Insufficient documentation

## 2017-11-26 DIAGNOSIS — J45909 Unspecified asthma, uncomplicated: Secondary | ICD-10-CM | POA: Diagnosis not present

## 2017-11-26 HISTORY — DX: Unspecified hemorrhoids: K64.9

## 2017-11-26 LAB — URINALYSIS, ROUTINE W REFLEX MICROSCOPIC
BILIRUBIN URINE: NEGATIVE
Glucose, UA: NEGATIVE mg/dL
HGB URINE DIPSTICK: NEGATIVE
KETONES UR: NEGATIVE mg/dL
Leukocytes, UA: NEGATIVE
Nitrite: NEGATIVE
PH: 5 (ref 5.0–8.0)
Protein, ur: NEGATIVE mg/dL

## 2017-11-26 LAB — COMPREHENSIVE METABOLIC PANEL
ALK PHOS: 76 U/L (ref 38–126)
ALT: 20 U/L (ref 14–54)
ANION GAP: 14 (ref 5–15)
AST: 38 U/L (ref 15–41)
Albumin: 3.9 g/dL (ref 3.5–5.0)
BILIRUBIN TOTAL: 0.7 mg/dL (ref 0.3–1.2)
BUN: 9 mg/dL (ref 6–20)
CALCIUM: 9.4 mg/dL (ref 8.9–10.3)
CO2: 22 mmol/L (ref 22–32)
Chloride: 101 mmol/L (ref 101–111)
Creatinine, Ser: 0.61 mg/dL (ref 0.44–1.00)
GFR calc Af Amer: >60 mL/min (ref >60)
GFR calc non Af Amer: >60 mL/min (ref >60)
GLUCOSE: 92 mg/dL (ref 65–99)
Potassium: 4.2 mmol/L (ref 3.5–5.1)
SODIUM: 137 mmol/L (ref 135–145)
TOTAL PROTEIN: 7.5 g/dL (ref 6.5–8.1)

## 2017-11-26 LAB — CBC
HCT: 35.6 % — ABNORMAL LOW (ref 36.0–46.0)
Hemoglobin: 11.5 g/dL — ABNORMAL LOW (ref 12.0–15.0)
MCH: 27.4 pg (ref 26.0–34.0)
MCHC: 32.3 g/dL (ref 30.0–36.0)
MCV: 85 fL (ref 78.0–100.0)
Platelets: 314 10*3/uL (ref 150–400)
RBC: 4.19 MIL/uL (ref 3.87–5.11)
RDW: 19.1 % — AB (ref 11.5–15.5)
WBC: 4.3 10*3/uL (ref 4.0–10.5)

## 2017-11-26 LAB — PREGNANCY, URINE: Preg Test, Ur: NEGATIVE

## 2017-11-26 LAB — LIPASE, BLOOD: Lipase: 51 U/L (ref 11–51)

## 2017-11-26 MED ORDER — ONDANSETRON HCL 4 MG/2ML IJ SOLN
4.0000 mg | Freq: Once | INTRAMUSCULAR | Status: AC
  Administered 2017-11-26: 4 mg via INTRAVENOUS
  Filled 2017-11-26: qty 2

## 2017-11-26 MED ORDER — CIPROFLOXACIN HCL 250 MG PO TABS
500.0000 mg | ORAL_TABLET | Freq: Once | ORAL | Status: AC
  Administered 2017-11-26: 500 mg via ORAL
  Filled 2017-11-26: qty 2

## 2017-11-26 MED ORDER — CIPROFLOXACIN HCL 500 MG PO TABS: 500.0000 mg | ORAL_TABLET | Freq: Two times a day (BID) | ORAL | 0 refills | Status: DC

## 2017-11-26 MED ORDER — MORPHINE SULFATE (PF) 4 MG/ML IV SOLN
4.0000 mg | INTRAVENOUS | Status: DC | PRN
  Administered 2017-11-26: 4 mg via INTRAVENOUS
  Filled 2017-11-26: qty 1

## 2017-11-26 MED ORDER — METRONIDAZOLE 500 MG PO TABS
500.0000 mg | ORAL_TABLET | Freq: Once | ORAL | Status: AC
  Administered 2017-11-26: 500 mg via ORAL
  Filled 2017-11-26: qty 1

## 2017-11-26 MED ORDER — ONDANSETRON 4 MG PO TBDP: 4.0000 mg | ORAL_TABLET | Freq: Three times a day (TID) | ORAL | 0 refills | Status: DC | PRN

## 2017-11-26 MED ORDER — METRONIDAZOLE 500 MG PO TABS: 500.0000 mg | ORAL_TABLET | Freq: Two times a day (BID) | ORAL | 0 refills | Status: DC

## 2017-11-26 MED ORDER — IOPAMIDOL (ISOVUE-300) INJECTION 61%
100.0000 mL | Freq: Once | INTRAVENOUS | Status: AC | PRN
  Administered 2017-11-26: 100 mL via INTRAVENOUS

## 2017-11-26 MED ORDER — HYDROMORPHONE HCL 1 MG/ML IJ SOLN
1.0000 mg | Freq: Once | INTRAMUSCULAR | Status: AC
  Administered 2017-11-26: 1 mg via INTRAVENOUS
  Filled 2017-11-26: qty 1

## 2017-11-26 MED ORDER — HYDROCODONE-ACETAMINOPHEN 5-325 MG PO TABS: 2.0000 | ORAL_TABLET | ORAL | 0 refills | Status: DC | PRN

## 2017-11-26 NOTE — ED Triage Notes (Addendum)
Pt reports dark red blood with bowel movements with left lower pelvic pain x 2 days. Hx of hemorrhoids. Also endorses n/v. Recently severely constipated per patient.

## 2017-11-26 NOTE — ED Notes (Signed)
Pt transported to CT ?

## 2017-11-26 NOTE — ED Provider Notes (Signed)
[facility] EMERGENCY DEPARTMENT Provider Note   CSN: [id number] Arrival date & time: 11/26/17  1439     History   Chief Complaint Chief Complaint  Patient presents with  . Rectal Bleeding    HPI Marissa Frank is a 47 y.o. female.  Complaint is normal pain, rectal pain, rectal bleeding  HPI 47 year old female.  He said several days of lower abdominal pain.  Had some blood and mucus from her stool.  No fevers.  Pain is become worse.  Cramping intermittent.  Painful to have a bowel movement.  No dysuria frequency hematuria.  No vaginal discharge or bleeding.  Past Medical History:  Diagnosis Date  . Asthma   . Cancer ([place])    cervical  . Hemorrhoids     There are no active problems to display for this patient.   Past Surgical History:  Procedure Laterality Date  . BREAST SURGERY    . CERVICAL CONE BIOPSY    . TUBAL LIGATION       OB History    Gravida  3   Para  2   Term  2   Preterm      AB  1   Living        SAB      TAB  1   Ectopic      Multiple      Live Births               Home Medications    Prior to Admission medications   Medication Sig Start Date End Date Taking? Authorizing Provider  albuterol (PROVENTIL) (2.5 MG/3ML) 0.083% nebulizer solution Take 3 mLs (2.5 mg total) by nebulization every 6 (six) hours as needed for wheezing or shortness of breath. 07/14/16  Yes [name], MD  aspirin 325 MG tablet Take 325 mg by mouth daily.   Yes [provider]  Cyanocobalamin (VITAMIN B-12 PO) Take 1 tablet by mouth once a week.    Yes [provider]  cyclobenzaprine (FLEXERIL) 10 MG tablet Take 1 tablet (10 mg total) by mouth 3 (three) times daily. 09/03/17  Yes Lily Kocher, PA-C  ciprofloxacin (CIPRO) 500 MG tablet Take 1 tablet (500 mg total) by mouth every 12 (twelve) hours. 11/26/17   Tanna Furry, MD  dexamethasone (DECADRON) 4 MG tablet Take 1 tablet (4 mg total) by mouth 2 (two) times daily with a  meal. Patient not taking: Reported on 11/26/2017 09/03/17   Lily Kocher, PA-C  diclofenac (VOLTAREN) 75 MG EC tablet Take 1 tablet (75 mg total) by mouth 2 (two) times daily. Patient not taking: Reported on 11/26/2017 09/03/17   Lily Kocher, PA-C  HYDROcodone-acetaminophen (NORCO/VICODIN) 5-325 MG tablet Take 2 tablets by mouth every 4 (four) hours as needed. 11/26/17   Tanna Furry, MD  metroNIDAZOLE (FLAGYL) 500 MG tablet Take 1 tablet (500 mg total) by mouth 2 (two) times daily. 11/26/17   Tanna Furry, MD  ondansetron (ZOFRAN ODT) 4 MG disintegrating tablet Take 1 tablet (4 mg total) by mouth every 8 (eight) hours as needed for nausea. 11/26/17   Tanna Furry, MD    Family History Family History  Problem Relation Age of Onset  . Hypertension Mother     Social History Social History   Tobacco Use  . Smoking status: Current Every Day Smoker    Packs/day: 0.50  . Smokeless tobacco: Never Used  Substance Use Topics  . Alcohol use: Yes    Comment: occ  . Drug  use: No     Allergies   Almond oil; Carrot [daucus carota]; Celery oil; Coconut flavor; and Macadamia nut oil   Review of Systems Review of Systems  Constitutional: Negative for appetite change, chills, diaphoresis, fatigue and fever.  HENT: Negative for mouth sores, sore throat and trouble swallowing.   Eyes: Negative for visual disturbance.  Respiratory: Negative for cough, chest tightness, shortness of breath and wheezing.   Cardiovascular: Negative for chest pain.  Gastrointestinal: Positive for abdominal pain, blood in stool and nausea. Negative for abdominal distention, diarrhea and vomiting.  Endocrine: Negative for polydipsia, polyphagia and polyuria.  Genitourinary: Negative for dysuria, frequency and hematuria.  Musculoskeletal: Negative for gait problem.  Skin: Negative for color change, pallor and rash.  Neurological: Negative for dizziness, syncope, light-headedness and headaches.  Hematological: Does not  bruise/bleed easily.  Psychiatric/Behavioral: Negative for behavioral problems and confusion.     Physical Exam Updated Vital Signs BP 110/70   Pulse 71   Temp 98.4 F (36.9 C) (Oral)   Resp 16   Ht 5\' 1"  (1.549 m)   Wt 42.2 kg (93 lb)   LMP 10/20/2017 (Approximate)   SpO2 98%   BMI 17.57 kg/m   Physical Exam  Constitutional: Marissa Frank is oriented to person, place, and time. Marissa Frank appears well-developed and well-nourished. No distress.  Appears uncomfortable.  No distress.  HENT:  Head: Normocephalic.  Eyes: Pupils are equal, round, and reactive to light. Conjunctivae are normal. No scleral icterus.  No scleral icterus.  No conjunctival injection.  Conjunctive are not pale.  Neck: Normal range of motion. Neck supple. No thyromegaly present.  Cardiovascular: Normal rate and regular rhythm. Exam reveals no gallop and no friction rub.  No murmur heard. Pulmonary/Chest: Effort normal and breath sounds normal. No respiratory distress. Marissa Frank has no wheezes. Marissa Frank has no rales.  Abdominal: Soft. Bowel sounds are normal. Marissa Frank exhibits no distension. There is no tenderness. There is no rebound.  Genitourinary:  Genitourinary Comments: Rectal exam shows blood and mucus in stool.  No peri-or rectal disease with abscess, fistula, fissure, or hemorrhoid. External genitalia and urethra appear normal  Musculoskeletal: Normal range of motion.  Neurological: Marissa Frank is alert and oriented to person, place, and time.  Skin: Skin is warm and dry. No rash noted.  Psychiatric: Marissa Frank has a normal mood and affect. Her behavior is normal.     ED Treatments / Results  Labs (all labs ordered are listed, but only abnormal results are displayed) Labs Reviewed  CBC - Abnormal; Notable for the following components:      Result Value   Hemoglobin 11.5 (*)    HCT 35.6 (*)    RDW 19.1 (*)    All other components within normal limits  URINALYSIS, ROUTINE W REFLEX MICROSCOPIC - Abnormal; Notable for the following  components:   Specific Gravity, Urine >1.046 (*)    All other components within normal limits  LIPASE, BLOOD  COMPREHENSIVE METABOLIC PANEL  PREGNANCY, URINE  GC/CHLAMYDIA PROBE AMP ([place]) NOT AT [facility]    EKG None  Radiology Ct Abdomen Pelvis W Contrast  Result Date: 11/26/2017 CLINICAL DATA:  47 y/o F; dark red blood with bowel movements. Left lower pelvic pain for 2 days. EXAM: CT ABDOMEN AND PELVIS WITH CONTRAST TECHNIQUE: Multidetector CT imaging of the abdomen and pelvis was performed using the standard protocol following bolus administration of intravenous contrast. CONTRAST:  138mL ISOVUE-300 IOPAMIDOL (ISOVUE-300) INJECTION 61% COMPARISON:  06/04/2016 CT abdomen and pelvis. FINDINGS: Lower chest:  No acute abnormality. Hepatobiliary: No focal liver abnormality is seen. No gallstones, gallbladder wall thickening, or biliary dilatation. Pancreas: Unremarkable. No pancreatic ductal dilatation or surrounding inflammatory changes. Spleen: Normal in size without focal abnormality. Adrenals/Urinary Tract: Adrenal glands are unremarkable. Kidneys are normal, without renal calculi, focal lesion, or hydronephrosis. Bladder is unremarkable. Stomach/Bowel: Stomach is within normal limits. Appendix not visualized, no pericecal inflammation. There is mild stranding of fat along the course of descending colon. Otherwise no obstructive or inflammatory changes of bowel identified. Vascular/Lymphatic: No significant vascular findings are present. No enlarged abdominal or pelvic lymph nodes. Reproductive: Fibroid uterus with the largest lesion in the anterior fundus measuring up to 3.7 cm. Mild edema and mucosal thickening of the urethra. Other: No abdominal wall hernia or abnormality. No abdominopelvic ascites. Musculoskeletal: No fracture is seen. IMPRESSION: 1. Mild edema surrounding descending colon, probable underlying colitis. No evidence for perforation or abscess. 2. Enhancement and mucosal  thickening of urethra, possibly ureteritis. 3. Fibroid uterus. Electronically Signed   By: Kristine Garbe M.D.   On: 11/26/2017 19:33    Procedures Procedures (including critical care time)  Medications Ordered in ED Medications  morphine 4 MG/ML injection 4 mg (4 mg Intravenous Given 11/26/17 1748)  HYDROmorphone (DILAUDID) injection 1 mg (has no administration in time range)  ondansetron (ZOFRAN) injection 4 mg (has no administration in time range)  metroNIDAZOLE (FLAGYL) tablet 500 mg (has no administration in time range)  ciprofloxacin (CIPRO) tablet 500 mg (has no administration in time range)  ondansetron (ZOFRAN) injection 4 mg (4 mg Intravenous Given 11/26/17 1747)  iopamidol (ISOVUE-300) 61 % injection 100 mL (100 mLs Intravenous Contrast Given 11/26/17 1846)     Initial Impression / Assessment and Plan / ED Course  I have reviewed the triage vital signs and the nursing notes.  Pertinent labs & imaging results that were available during my care of the patient were reviewed by me and considered in my medical decision making (see chart for details).   For colitis.  Possible diverticulitis.  CT scan shows segmental colitis.  Shows inflammation of her urethra.  This appears normal.  GC and chlamydia and urine obtained.  GC chlamydia pending.  Urine normal.  Plan Cipro, Flagyl.  Given IV pain meds.  Symptoms improving.  Plan will be home, increase fluids.  Cipro Flagyl.  OTC probiotic.  PRN Vicodin.  Colace for constipation.  Zofran for nausea.  Recheck with any worsening.  Final Clinical Impressions(s) / ED Diagnoses   Final diagnoses:  Colitis    ED Discharge Orders        Ordered    ondansetron (ZOFRAN ODT) 4 MG disintegrating tablet  Every 8 hours PRN     11/26/17 2128    HYDROcodone-acetaminophen (NORCO/VICODIN) 5-325 MG tablet  Every 4 hours PRN     11/26/17 2128    ciprofloxacin (CIPRO) 500 MG tablet  Every 12 hours     11/26/17 2128    metroNIDAZOLE (FLAGYL)  500 MG tablet  2 times daily     11/26/17 2128       Tanna Furry, MD 11/26/17 2152

## 2017-11-26 NOTE — Discharge Instructions (Signed)
Antibiotics as prescribed. Vicodin for pain. Zofran for nausea. Take daily probiotic over-the-counter to prevent diarrhea

## 2017-11-26 NOTE — ED Notes (Addendum)
Pelvic set up at bedside, pt unable to urinate at this time to collect sample, water given

## 2017-11-26 NOTE — ED Notes (Signed)
Pt reports pain in rectum since straining to have BM 2 days ago. Also intermittent pain right lower quad

## 2017-11-28 LAB — GC/CHLAMYDIA PROBE AMP (~~LOC~~) NOT AT ARMC
Chlamydia: NEGATIVE
Neisseria Gonorrhea: NEGATIVE

## 2018-01-04 IMAGING — CT CT ABD-PELV W/ CM
2 of 5 series · 15 of 46 positions shown, 17 images · IV contrast (iopamidol)
Comparison: 03/05/2016

CLINICAL DATA: Left lower quadrant pain with nausea and diarrhea
for 2 days. Cervical cancer.

EXAM:
CT ABDOMEN AND PELVIS WITH CONTRAST
TECHNIQUE: Multidetector CT imaging of the abdomen and pelvis was performed
using the standard protocol following bolus administration of
intravenous contrast.
CONTRAST:  30mL TRUSWA-4XX IOPAMIDOL (TRUSWA-4XX) INJECTION 61%,
100mL TRUSWA-4XX IOPAMIDOL (TRUSWA-4XX) INJECTION 61%

[Series 2: axial st · axial · 0.60mm/px · z∈[-344,+21]mm · 12 of 83 slices shown, 14 images]
[im 5/83  soft-tissue]
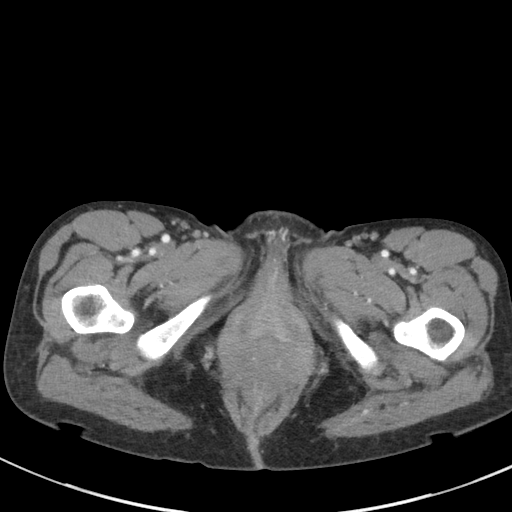
[im 5/83  bone]
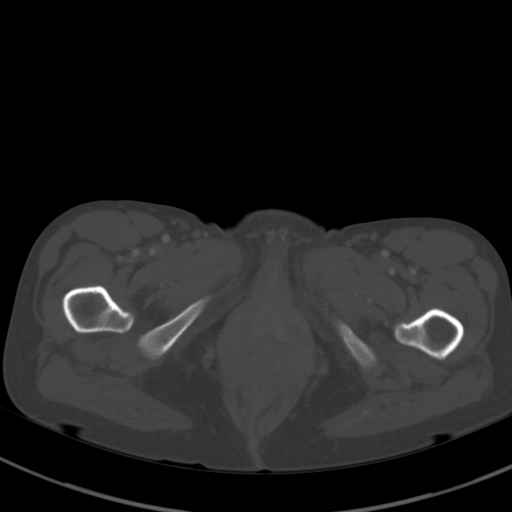
[im 15/83  soft-tissue]
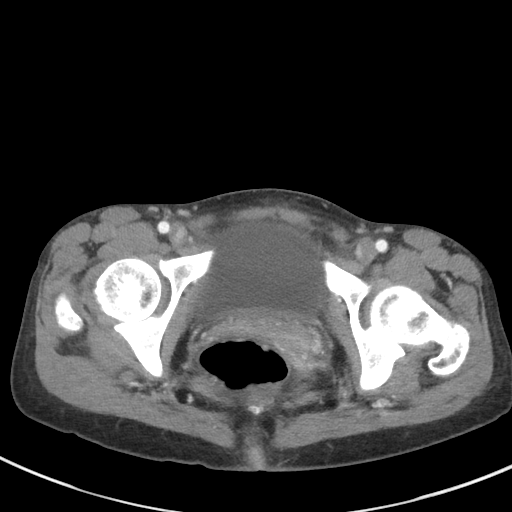
[im 20/83  soft-tissue]
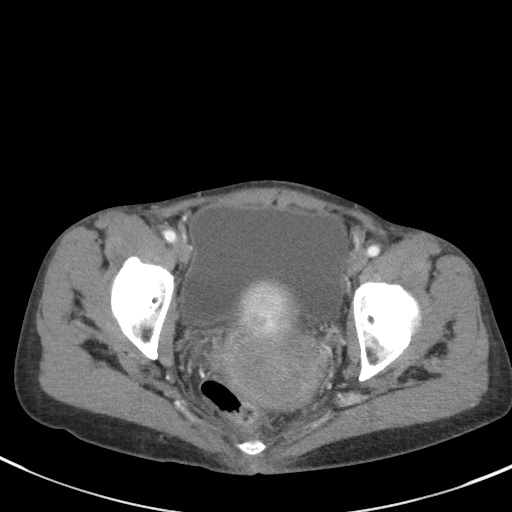
[im 25/83  soft-tissue]
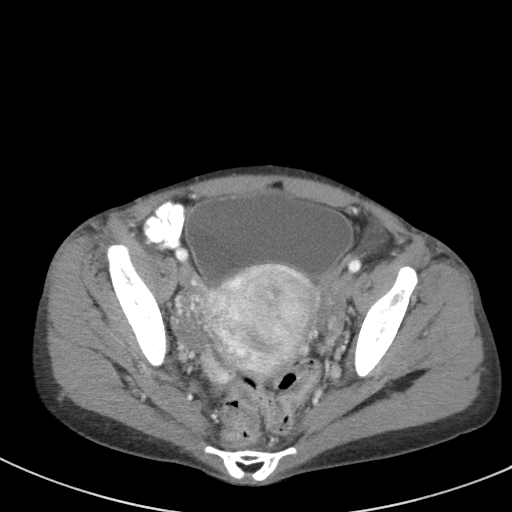
[im 34/83  soft-tissue]
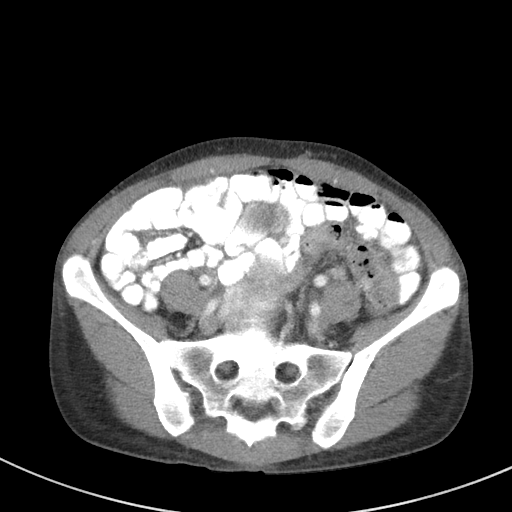
[im 39/83  soft-tissue]
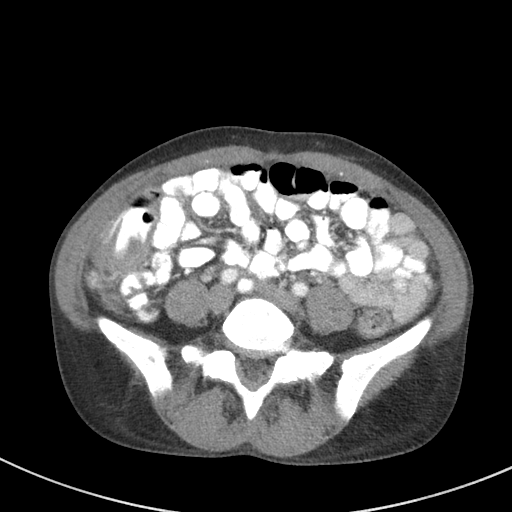
[im 44/83  soft-tissue]
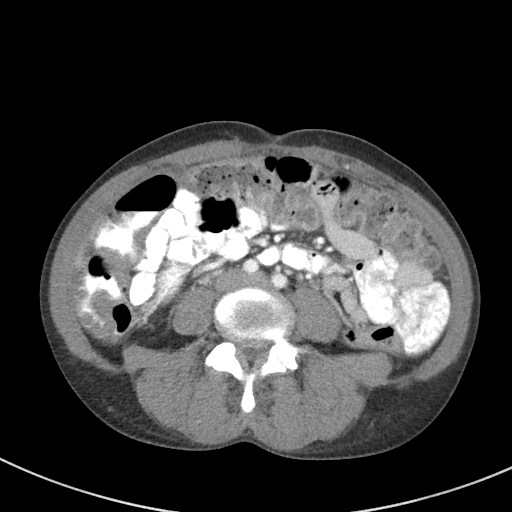
[im 54/83  soft-tissue]
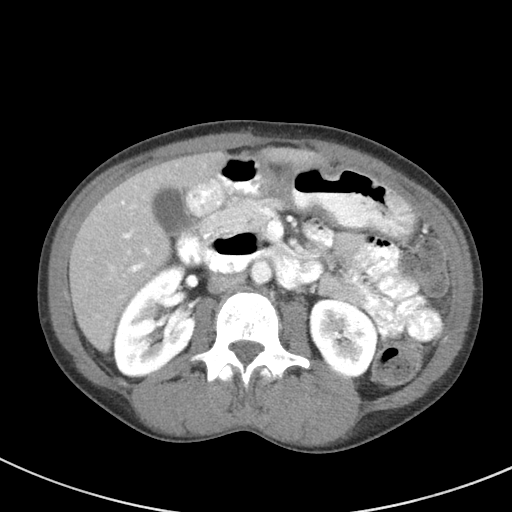
[im 58/83  soft-tissue]
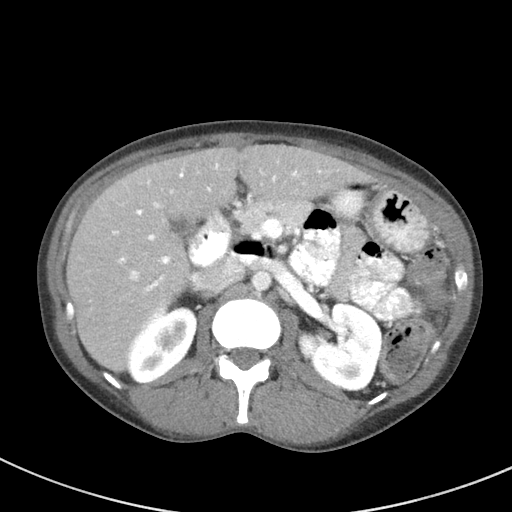
[im 58/83  bone]
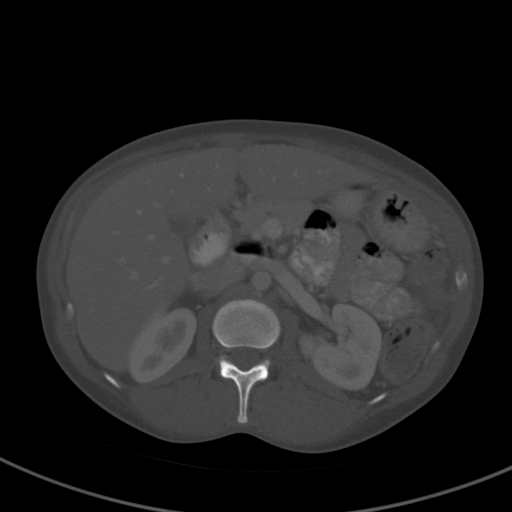
[im 63/83  soft-tissue]
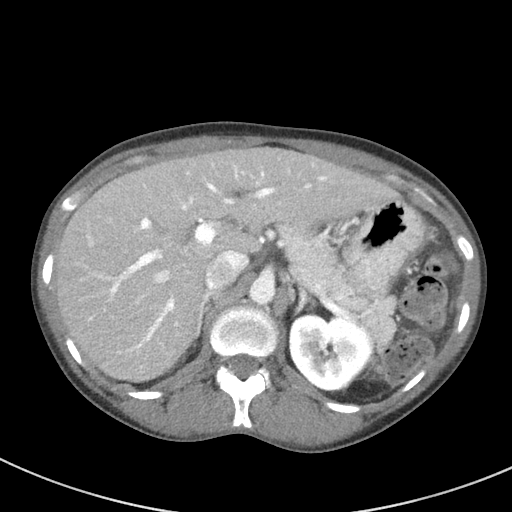
[im 73/83  soft-tissue]
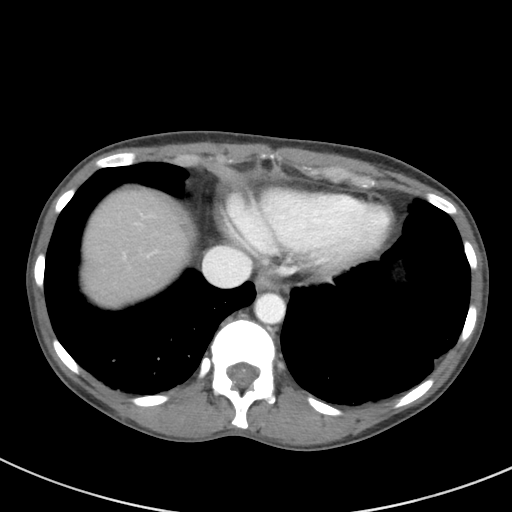
[im 78/83  soft-tissue]
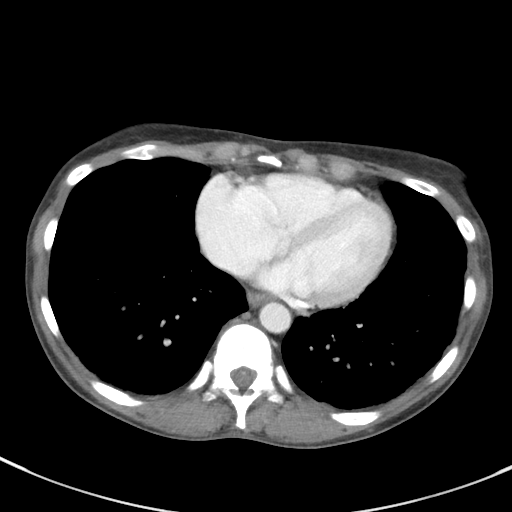

[Series 4: coronal st · coronal · 0.57mm/px · 3 of 73 slices shown]
[im 25/73  soft-tissue]
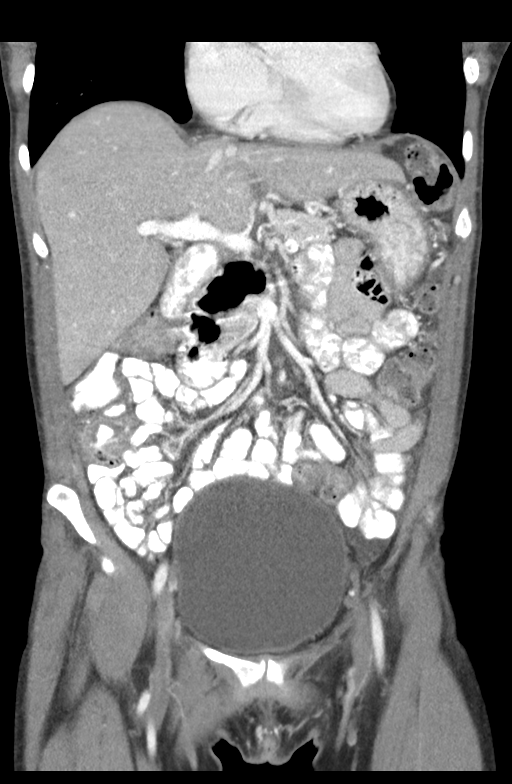
[im 33/73  soft-tissue]
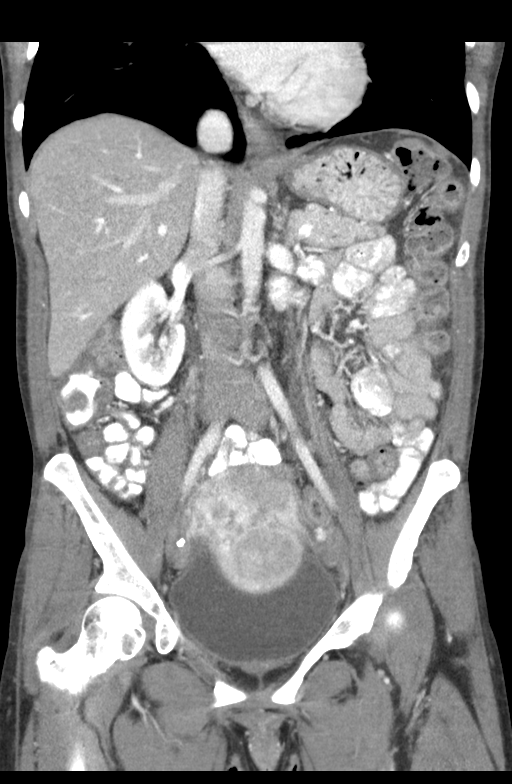
[im 41/73  soft-tissue]
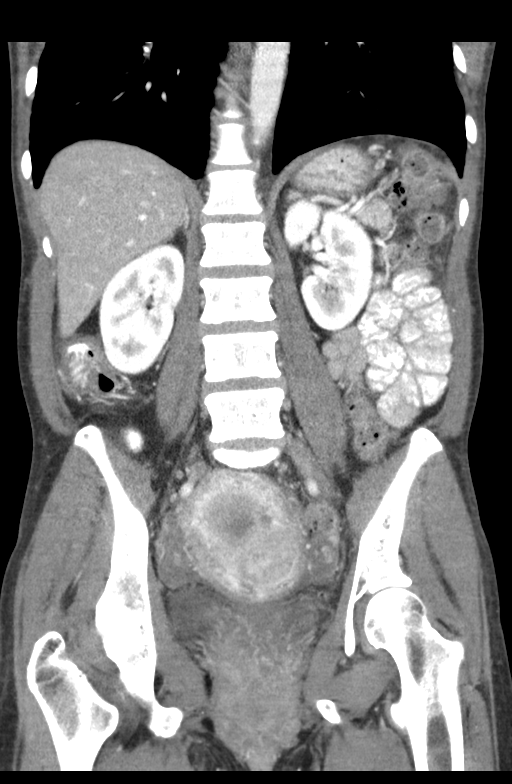

[15 of 46 positions shown; findings below may reference images not displayed]

FINDINGS: Lower chest: Left base scarring. Normal heart size without
pericardial or pleural effusion.

Hepatobiliary: Normal liver. Normal gallbladder, without biliary
ductal dilatation.

Pancreas: Normal, without mass or ductal dilatation.

Spleen: Normal in size, without focal abnormality.

Adrenals/Urinary Tract: Normal adrenal glands. Normal kidneys,
without hydronephrosis. Normal urinary bladder.

Stomach/Bowel: Proximal gastric underdistention. Apparent ascending
colonic wall thickening is favored to be due to underdistention,
including on image 40/series 2. Normal terminal ileum. Appendix not
well visualized. No right lower quadrant inflammation identified.
Normal small bowel.

Vascular/Lymphatic: Normal caliber of the aorta and branch vessels.
No abdominopelvic adenopathy.

Reproductive: Fibroid uterus, including an anterior fundal lesion of
3.7 cm. No adnexal mass.

Other: No significant free fluid. No free intraperitoneal air. Mild
pelvic floor laxity.

Musculoskeletal: Disc bulges at L4-5 and L5-S1.
IMPRESSION: 1. Apparent ascending colonic wall thickening is favored to be due
to underdistention. Infectious colitis felt less likely.
2. No other explanation for patient's symptoms.
3. Fibroid uterus.
4. No abdominal pelvic adenopathy to suggest metastatic disease.

## 2018-01-14 ENCOUNTER — Emergency Department (HOSPITAL_COMMUNITY)
Admission: EM | Admit: 2018-01-14 | Discharge: 2018-01-14 | Disposition: A | Discharge status: Home / Self Care | Payer: BLUE CROSS/BLUE SHIELD | Attending: Emergency Medicine | Admitting: Emergency Medicine

## 2018-01-14 ENCOUNTER — Encounter (HOSPITAL_COMMUNITY): Payer: Self-pay | Admitting: Emergency Medicine

## 2018-01-14 ENCOUNTER — Other Ambulatory Visit: Payer: Self-pay

## 2018-01-14 ENCOUNTER — Emergency Department (HOSPITAL_COMMUNITY): Payer: BLUE CROSS/BLUE SHIELD

## 2018-01-14 DIAGNOSIS — Z7982 Long term (current) use of aspirin: Secondary | ICD-10-CM | POA: Insufficient documentation

## 2018-01-14 DIAGNOSIS — J45909 Unspecified asthma, uncomplicated: Secondary | ICD-10-CM | POA: Insufficient documentation

## 2018-01-14 DIAGNOSIS — F17200 Nicotine dependence, unspecified, uncomplicated: Secondary | ICD-10-CM | POA: Diagnosis not present

## 2018-01-14 DIAGNOSIS — Z79899 Other long term (current) drug therapy: Secondary | ICD-10-CM | POA: Diagnosis not present

## 2018-01-14 DIAGNOSIS — K2900 Acute gastritis without bleeding: Secondary | ICD-10-CM | POA: Insufficient documentation

## 2018-01-14 DIAGNOSIS — R1084 Generalized abdominal pain: Secondary | ICD-10-CM | POA: Diagnosis present

## 2018-01-14 LAB — COMPREHENSIVE METABOLIC PANEL
ALT: 25 U/L (ref 14–54)
ANION GAP: 11 (ref 5–15)
AST: 60 U/L — ABNORMAL HIGH (ref 15–41)
Albumin: 4 g/dL (ref 3.5–5.0)
Alkaline Phosphatase: 75 U/L (ref 38–126)
BILIRUBIN TOTAL: 0.5 mg/dL (ref 0.3–1.2)
BUN: 9 mg/dL (ref 6–20)
CO2: 24 mmol/L (ref 22–32)
Calcium: 9.5 mg/dL (ref 8.9–10.3)
Chloride: 103 mmol/L (ref 101–111)
Creatinine, Ser: 0.69 mg/dL (ref 0.44–1.00)
GFR calc Af Amer: >60 mL/min (ref >60)
Glucose, Bld: 93 mg/dL (ref 65–99)
POTASSIUM: 3.8 mmol/L (ref 3.5–5.1)
Sodium: 138 mmol/L (ref 135–145)
TOTAL PROTEIN: 7.4 g/dL (ref 6.5–8.1)

## 2018-01-14 LAB — URINALYSIS, ROUTINE W REFLEX MICROSCOPIC
Bilirubin Urine: NEGATIVE
Glucose, UA: NEGATIVE mg/dL
Hgb urine dipstick: NEGATIVE
KETONES UR: NEGATIVE mg/dL
Leukocytes, UA: NEGATIVE
NITRITE: NEGATIVE
PH: 6 (ref 5.0–8.0)
Protein, ur: NEGATIVE mg/dL
Specific Gravity, Urine: 1.01 (ref 1.005–1.030)

## 2018-01-14 LAB — CBC
HEMATOCRIT: 34.7 % — AB (ref 36.0–46.0)
HEMOGLOBIN: 11.3 g/dL — AB (ref 12.0–15.0)
MCH: 29.3 pg (ref 26.0–34.0)
MCHC: 32.6 g/dL (ref 30.0–36.0)
MCV: 89.9 fL (ref 78.0–100.0)
Platelets: 337 10*3/uL (ref 150–400)
RBC: 3.86 MIL/uL — ABNORMAL LOW (ref 3.87–5.11)
RDW: 19.1 % — AB (ref 11.5–15.5)
WBC: 4 10*3/uL (ref 4.0–10.5)

## 2018-01-14 LAB — PREGNANCY, URINE: Preg Test, Ur: NEGATIVE

## 2018-01-14 LAB — LIPASE, BLOOD: Lipase: 58 U/L — ABNORMAL HIGH (ref 11–51)

## 2018-01-14 MED ORDER — HYDROMORPHONE HCL 1 MG/ML IJ SOLN
0.5000 mg | Freq: Once | INTRAMUSCULAR | Status: AC
  Administered 2018-01-14: 0.5 mg via INTRAVENOUS
  Filled 2018-01-14: qty 1

## 2018-01-14 MED ORDER — ONDANSETRON HCL 4 MG/2ML IJ SOLN
4.0000 mg | Freq: Once | INTRAMUSCULAR | Status: AC
  Administered 2018-01-14: 4 mg via INTRAVENOUS
  Filled 2018-01-14: qty 2

## 2018-01-14 MED ORDER — OMEPRAZOLE 40 MG PO CPDR: 40.0000 mg | DELAYED_RELEASE_CAPSULE | Freq: Every day | ORAL | 0 refills | Status: DC

## 2018-01-14 MED ORDER — IOHEXOL 300 MG/ML  SOLN
75.0000 mL | Freq: Once | INTRAMUSCULAR | Status: AC | PRN
  Administered 2018-01-14: 75 mL via INTRAVENOUS

## 2018-01-14 MED ORDER — HYDROCODONE-ACETAMINOPHEN 5-325 MG PO TABS: ORAL_TABLET | ORAL | 0 refills | Status: DC

## 2018-01-14 MED ORDER — SODIUM CHLORIDE 0.9 % IV BOLUS
1000.0000 mL | Freq: Once | INTRAVENOUS | Status: AC
  Administered 2018-01-14: 1000 mL via INTRAVENOUS

## 2018-01-14 NOTE — ED Triage Notes (Signed)
Pt c/o abdominal pain with n/v/d x 2 weeks. States her symptoms worsened on Thursday. Pt reports that she attempted to transfer her medications for a "bacterial infection in my stomach" from [place] to [place], but they were unable to transfer medications.

## 2018-01-14 NOTE — Discharge Instructions (Addendum)
Bland diet for a few days.  Avoid alcohol.  Call Dr. Roseanne Kaufman office to arrange a follow-up appt.

## 2018-01-14 NOTE — ED Notes (Signed)
Pt says she is unable to wait any longer for reassessment after dilaudid given because her ride is here and has to go to [place].

## 2018-01-14 NOTE — ED Provider Notes (Signed)
[facility] EMERGENCY DEPARTMENT Provider Note   CSN: [id number] Arrival date & time: 01/14/18  2671     History   Chief Complaint Chief Complaint  Patient presents with  . Abdominal Pain    HPI Marissa Frank is a 47 y.o. female.  HPI   Marissa Frank is a 47 y.o. female who presents to the Emergency Department complaining of persistent abdominal pain since the end of April.  States she was seen here at that time and diagnosed with colitis.  She was given prescriptions for Cipro and Flagyl which she never got filled.  She states that she took her medications to a different pharmacy thinking that the pharmacy will deliver her medications but she never received them as she did not have transportation to go to the pharmacy to pick them up.  She states her abdominal pain has become worse 5 days ago and is now associated with vomiting and feeling like her abdomen is swollen.  She states that she has been unable to keep down solid foods.  No fever or diarrhea.  She also complains of generalized weakness which she contributes to dehydration.  No chest pain, shortness of breath, or dysuria.  Past Medical History:  Diagnosis Date  . Asthma   . Cancer ([place])    cervical  . Hemorrhoids     There are no active problems to display for this patient.   Past Surgical History:  Procedure Laterality Date  . BREAST SURGERY    . CERVICAL CONE BIOPSY    . TUBAL LIGATION       OB History    Gravida  3   Para  2   Term  2   Preterm      AB  1   Living        SAB      TAB  1   Ectopic      Multiple      Live Births               Home Medications    Prior to Admission medications   Medication Sig Start Date End Date Taking? Authorizing Provider  albuterol (PROVENTIL) (2.5 MG/3ML) 0.083% nebulizer solution Take 3 mLs (2.5 mg total) by nebulization every 6 (six) hours as needed for wheezing or shortness of breath. 07/14/16   [name], MD  aspirin  325 MG tablet Take 325 mg by mouth daily.    [provider]  ciprofloxacin (CIPRO) 500 MG tablet Take 1 tablet (500 mg total) by mouth every 12 (twelve) hours. 11/26/17   Tanna Furry, MD  Cyanocobalamin (VITAMIN B-12 PO) Take 1 tablet by mouth once a week.     [provider]  cyclobenzaprine (FLEXERIL) 10 MG tablet Take 1 tablet (10 mg total) by mouth 3 (three) times daily. 09/03/17   Lily Kocher, PA-C  dexamethasone (DECADRON) 4 MG tablet Take 1 tablet (4 mg total) by mouth 2 (two) times daily with a meal. Patient not taking: Reported on 11/26/2017 09/03/17   Lily Kocher, PA-C  diclofenac (VOLTAREN) 75 MG EC tablet Take 1 tablet (75 mg total) by mouth 2 (two) times daily. Patient not taking: Reported on 11/26/2017 09/03/17   Lily Kocher, PA-C  HYDROcodone-acetaminophen (NORCO/VICODIN) 5-325 MG tablet Take 2 tablets by mouth every 4 (four) hours as needed. 11/26/17   Tanna Furry, MD  metroNIDAZOLE (FLAGYL) 500 MG tablet Take 1 tablet (500 mg total) by mouth 2 (two) times daily. 11/26/17  Tanna Furry, MD  ondansetron (ZOFRAN ODT) 4 MG disintegrating tablet Take 1 tablet (4 mg total) by mouth every 8 (eight) hours as needed for nausea. 11/26/17   Tanna Furry, MD    Family History Family History  Problem Relation Age of Onset  . Hypertension Mother     Social History Social History   Tobacco Use  . Smoking status: Current Every Day Smoker    Packs/day: 1.00  . Smokeless tobacco: Never Used  Substance Use Topics  . Alcohol use: Yes    Comment: occ  . Drug use: No     Allergies   Almond oil; Carrot [daucus carota]; Celery oil; Coconut flavor; and Macadamia nut oil   Review of Systems Review of Systems  Constitutional: Negative for appetite change, chills and fever.  Respiratory: Negative for shortness of breath.   Cardiovascular: Negative for chest pain.  Gastrointestinal: Positive for abdominal distention, abdominal pain, nausea and vomiting. Negative for blood  in stool.  Genitourinary: Negative for decreased urine volume, difficulty urinating, dysuria and flank pain.  Musculoskeletal: Negative for back pain.  Skin: Negative for color change and rash.  Neurological: Negative for dizziness, weakness (generalized weakness) and numbness.  Hematological: Negative for adenopathy.  All other systems reviewed and are negative.    Physical Exam Updated Vital Signs BP 114/78 (BP Location: Right Arm)   Pulse 71   Temp 98.8 F (37.1 C) (Oral)   Resp 18   Ht 5\' 1"  (1.549 m)   Wt 42.2 kg (93 lb)   LMP 12/15/2017 (Approximate)   SpO2 99%   BMI 17.57 kg/m   Physical Exam  Constitutional: She is oriented to person, place, and time. She appears well-developed and well-nourished. No distress.  HENT:  Head: Normocephalic and atraumatic.  Mouth/Throat: Oropharynx is clear and moist.  Cardiovascular: Normal rate, regular rhythm and intact distal pulses.  No murmur heard. Pulmonary/Chest: Effort normal and breath sounds normal. No respiratory distress.  Abdominal: Soft. Normal appearance and bowel sounds are normal. She exhibits no distension and no mass. There is generalized tenderness. There is no rigidity, no rebound, no guarding and no CVA tenderness.  Musculoskeletal: Normal range of motion. She exhibits no edema.  Neurological: She is alert and oriented to person, place, and time. No sensory deficit. She exhibits normal muscle tone. Coordination normal.  Skin: Skin is warm and dry. Capillary refill takes less than 2 seconds.  Psychiatric: She has a normal mood and affect.  Nursing note and vitals reviewed.    ED Treatments / Results  Labs (all labs ordered are listed, but only abnormal results are displayed) Labs Reviewed  LIPASE, BLOOD - Abnormal; Notable for the following components:      Result Value   Lipase 58 (*)    All other components within normal limits  COMPREHENSIVE METABOLIC PANEL - Abnormal; Notable for the following  components:   AST 60 (*)    All other components within normal limits  CBC - Abnormal; Notable for the following components:   RBC 3.86 (*)    Hemoglobin 11.3 (*)    HCT 34.7 (*)    RDW 19.1 (*)    All other components within normal limits  URINALYSIS, ROUTINE W REFLEX MICROSCOPIC - Abnormal; Notable for the following components:   APPearance CLOUDY (*)    All other components within normal limits  PREGNANCY, URINE    EKG None  Radiology Ct Abdomen Pelvis W Contrast  Result Date: 01/14/2018 CLINICAL DATA:  48 year old female  with history of abdominal pain, nausea, vomiting and diarrhea with rectal bleeding for the past 2 weeks. EXAM: CT ABDOMEN AND PELVIS WITH CONTRAST TECHNIQUE: Multidetector CT imaging of the abdomen and pelvis was performed using the standard protocol following bolus administration of intravenous contrast. CONTRAST:  33mL OMNIPAQUE IOHEXOL 300 MG/ML  SOLN COMPARISON:  CT the abdomen and pelvis 11/26/2017. FINDINGS: Lower chest: Unremarkable. Hepatobiliary: No cystic or solid hepatic lesions. No intra or extrahepatic biliary ductal dilatation. Gallbladder is normal in appearance. Pancreas: No pancreatic mass. No pancreatic ductal dilatation. No pancreatic or peripancreatic fluid or inflammatory changes. Spleen: Unremarkable. Adrenals/Urinary Tract: Bilateral kidneys and bilateral adrenal glands are normal in appearance. No hydroureteronephrosis. Urinary bladder is normal in appearance. Stomach/Bowel: Mural thickening in the antral pre-pyloric region of the stomach, best appreciated on axial image 34 of series 2, suspicious for potential antral gastritis. Stomach is otherwise unremarkable in appearance. No pathologic dilatation of small bowel or colon. Normal appendix. Vascular/Lymphatic: No significant atherosclerotic disease, aneurysm or dissection noted in the abdominal or pelvic vasculature. No lymphadenopathy noted in the abdomen or pelvis. Reproductive: Uterus is enlarged  and very heterogeneous in appearance containing multiple heterogeneously enhancing lesions, the largest of which is in the anterior aspect of the uterine body measuring 4.6 x 4.8 x 4.5 cm, most compatible with multifocal fibroids. Ovaries are unremarkable in appearance. Other: No significant volume of ascites.  No pneumoperitoneum. Musculoskeletal: There are no aggressive appearing lytic or blastic lesions noted in the visualized portions of the skeleton. IMPRESSION: 1. Mural thickening in the antral pre-pyloric region of the stomach. This is concerning for potential antral gastritis, however, the possibility of infiltrative neoplasm is not entirely excluded. Further clinical evaluation is suggested, with consideration for follow-up evaluation with endoscopy if clinically appropriate. 2. Fibroid uterus redemonstrated. 3. Normal appendix. Electronically Signed   By: Vinnie Langton M.D.   On: 01/14/2018 12:06     Procedures Procedures (including critical care time)  Medications Ordered in ED Medications - No data to display   Initial Impression / Assessment and Plan / ED Course  I have reviewed the triage vital signs and the nursing notes.  Pertinent labs & imaging results that were available during my care of the patient were reviewed by me and considered in my medical decision making (see chart for details).     Pt non-toxic appearing.  Feeling better after IVF's and medications.  No vomiting or diarrhea during ED stay.  Discussed lab and CT scan results with the pt including need for GI f/u.  Previous CT scan showing colitis no longer present on repeat scan.  Pt reports ready for d/c and agrees to close GI f/u and I have discussed possible need for endoscopy.  Counseled pt on dietary changes, alcohol avoidance. rx for Priolsec.  Return precautions discussed.  Final Clinical Impressions(s) / ED Diagnoses   Final diagnoses:  Acute gastritis without hemorrhage, unspecified gastritis type     ED Discharge Orders    None       Kem Parkinson, PA-C 01/16/18 1311    Sherwood Gambler, MD 01/18/18 [phone]

## 2018-01-21 ENCOUNTER — Encounter: Payer: Self-pay | Admitting: Gastroenterology

## 2018-03-17 ENCOUNTER — Other Ambulatory Visit: Payer: Self-pay

## 2018-03-17 ENCOUNTER — Emergency Department (HOSPITAL_COMMUNITY): Payer: BLUE CROSS/BLUE SHIELD

## 2018-03-17 ENCOUNTER — Emergency Department (HOSPITAL_COMMUNITY)
Admission: EM | Admit: 2018-03-17 | Discharge: 2018-03-17 | Disposition: A | Discharge status: Home / Self Care | Payer: BLUE CROSS/BLUE SHIELD | Attending: Emergency Medicine | Admitting: Emergency Medicine

## 2018-03-17 ENCOUNTER — Encounter (HOSPITAL_COMMUNITY): Payer: Self-pay

## 2018-03-17 DIAGNOSIS — T5491XA Toxic effect of unspecified corrosive substance, accidental (unintentional), initial encounter: Secondary | ICD-10-CM | POA: Diagnosis not present

## 2018-03-17 DIAGNOSIS — T23491A Corrosion of unspecified degree of multiple sites of right wrist and hand, initial encounter: Secondary | ICD-10-CM | POA: Insufficient documentation

## 2018-03-17 DIAGNOSIS — J45909 Unspecified asthma, uncomplicated: Secondary | ICD-10-CM | POA: Diagnosis not present

## 2018-03-17 DIAGNOSIS — R0789 Other chest pain: Secondary | ICD-10-CM | POA: Diagnosis not present

## 2018-03-17 DIAGNOSIS — S6991XA Unspecified injury of right wrist, hand and finger(s), initial encounter: Secondary | ICD-10-CM | POA: Insufficient documentation

## 2018-03-17 DIAGNOSIS — Z79899 Other long term (current) drug therapy: Secondary | ICD-10-CM | POA: Diagnosis not present

## 2018-03-17 DIAGNOSIS — T23492A Corrosion of unspecified degree of multiple sites of left wrist and hand, initial encounter: Secondary | ICD-10-CM | POA: Diagnosis not present

## 2018-03-17 DIAGNOSIS — Y929 Unspecified place or not applicable: Secondary | ICD-10-CM | POA: Insufficient documentation

## 2018-03-17 DIAGNOSIS — Y999 Unspecified external cause status: Secondary | ICD-10-CM | POA: Insufficient documentation

## 2018-03-17 DIAGNOSIS — Y33XXXA Other specified events, undetermined intent, initial encounter: Secondary | ICD-10-CM | POA: Insufficient documentation

## 2018-03-17 DIAGNOSIS — F17210 Nicotine dependence, cigarettes, uncomplicated: Secondary | ICD-10-CM | POA: Diagnosis not present

## 2018-03-17 DIAGNOSIS — S6992XA Unspecified injury of left wrist, hand and finger(s), initial encounter: Secondary | ICD-10-CM | POA: Insufficient documentation

## 2018-03-17 DIAGNOSIS — Y93G1 Activity, food preparation and clean up: Secondary | ICD-10-CM | POA: Diagnosis not present

## 2018-03-17 DIAGNOSIS — T304 Corrosion of unspecified body region, unspecified degree: Secondary | ICD-10-CM

## 2018-03-17 LAB — CBC WITH DIFFERENTIAL/PLATELET
BASOS ABS: 0 10*3/uL (ref 0.0–0.1)
Basophils Relative: 1 %
EOS PCT: 1 %
Eosinophils Absolute: 0.1 10*3/uL (ref 0.0–0.7)
HEMATOCRIT: 32.8 % — AB (ref 36.0–46.0)
Hemoglobin: 10.7 g/dL — ABNORMAL LOW (ref 12.0–15.0)
LYMPHS ABS: 2.1 10*3/uL (ref 0.7–4.0)
LYMPHS PCT: 32 %
MCH: 30 pg (ref 26.0–34.0)
MCHC: 32.6 g/dL (ref 30.0–36.0)
MCV: 91.9 fL (ref 78.0–100.0)
MONO ABS: 1.1 10*3/uL — AB (ref 0.1–1.0)
Monocytes Relative: 17 %
NEUTROS ABS: 3.1 10*3/uL (ref 1.7–7.7)
Neutrophils Relative %: 49 %
PLATELETS: 395 10*3/uL (ref 150–400)
RBC: 3.57 MIL/uL — AB (ref 3.87–5.11)
RDW: 19 % — ABNORMAL HIGH (ref 11.5–15.5)
WBC: 6.4 10*3/uL (ref 4.0–10.5)

## 2018-03-17 LAB — COMPREHENSIVE METABOLIC PANEL
ALT: 23 U/L (ref 0–44)
AST: 48 U/L — ABNORMAL HIGH (ref 15–41)
Albumin: 3.5 g/dL (ref 3.5–5.0)
Alkaline Phosphatase: 70 U/L (ref 38–126)
Anion gap: 10 (ref 5–15)
BILIRUBIN TOTAL: 0.4 mg/dL (ref 0.3–1.2)
BUN: 11 mg/dL (ref 6–20)
CALCIUM: 9 mg/dL (ref 8.9–10.3)
CHLORIDE: 105 mmol/L (ref 98–111)
CO2: 24 mmol/L (ref 22–32)
Creatinine, Ser: 0.59 mg/dL (ref 0.44–1.00)
Glucose, Bld: 89 mg/dL (ref 70–99)
Potassium: 3.6 mmol/L (ref 3.5–5.1)
Sodium: 139 mmol/L (ref 135–145)
TOTAL PROTEIN: 6.9 g/dL (ref 6.5–8.1)

## 2018-03-17 LAB — LIPASE, BLOOD: LIPASE: 51 U/L (ref 11–51)

## 2018-03-17 LAB — TROPONIN I: Troponin I: <0.03 ng/mL (ref <0.03)

## 2018-03-17 LAB — ETHANOL: Alcohol, Ethyl (B): 168 mg/dL — ABNORMAL HIGH (ref <10)

## 2018-03-17 LAB — D-DIMER, QUANTITATIVE: D-Dimer, Quant: <0.27 ug/mL-FEU (ref 0.00–0.50)

## 2018-03-17 MED ORDER — GI COCKTAIL ~~LOC~~
30.0000 mL | Freq: Once | ORAL | Status: AC
  Administered 2018-03-17: 30 mL via ORAL
  Filled 2018-03-17: qty 30

## 2018-03-17 MED ORDER — BACITRACIN-NEOMYCIN-POLYMYXIN 400-5-5000 EX OINT
TOPICAL_OINTMENT | Freq: Once | CUTANEOUS | Status: AC
  Administered 2018-03-17: 1 via TOPICAL
  Filled 2018-03-17: qty 2

## 2018-03-17 MED ORDER — BACITRACIN-NEOMYCIN-POLYMYXIN 400-5-5000 EX OINT: 1.0000 "application " | TOPICAL_OINTMENT | Freq: Two times a day (BID) | CUTANEOUS | 0 refills | Status: DC

## 2018-03-17 MED ORDER — OMEPRAZOLE 40 MG PO CPDR: 40.0000 mg | DELAYED_RELEASE_CAPSULE | Freq: Every day | ORAL | 0 refills | Status: DC

## 2018-03-17 NOTE — ED Provider Notes (Signed)
[facility] Provider Note   CSN: [id number] Arrival date & time: 03/17/18  0340     History   Chief Complaint Chief Complaint  Patient presents with  . Hand Pain    chest pain yesterday    HPI Marissa Frank is a 47 y.o. female.  Patient complains of bilateral hand pain and redness for the past 4 days.  This acutely worsened tonight when she was washing dishes with straight Clorox bleach.  She complains of pain to her bilateral hands and redness with some cracking along her fingertips.  She denies any other exposures other than the Clorox and states her hands were hurting before she used the Clorox but now are much worse. No fever, chills, vomiting.  She also states she been having chest pain for the past 4 days as well that starts in the center of her chest and radiates to the back.  It lasts for about 5 to 10 seconds at a time but seems to recur frequently after.  The pain is not exertional or pleuritic.  Is associated with some nausea and she had one episode of vomiting.  No diaphoresis or syncope.  She admits to drinking alcohol over this weekend but states the chest pain was ongoing before this.  She has not had any chest pain since August 17.  She describes pain in the center of her chest that went to her back lasting for several seconds at a time but recurring frequently and never really going away.  Denies any cocaine or other drug use.  No history of CAD.  The history is provided by the patient.  Hand Pain  Associated symptoms include shortness of breath. Pertinent negatives include no chest pain and no headaches.    Past Medical History:  Diagnosis Date  . Asthma   . Cancer ([place])    cervical  . Hemorrhoids     There are no active problems to display for this patient.   Past Surgical History:  Procedure Laterality Date  . BREAST SURGERY    . CERVICAL CONE BIOPSY    . TUBAL LIGATION       OB History    Gravida  3   Para  2   Term   2   Preterm      AB  1   Living        SAB      TAB  1   Ectopic      Multiple      Live Births               Home Medications    Prior to Admission medications   Medication Sig Start Date End Date Taking? Authorizing Provider  albuterol (PROVENTIL) (2.5 MG/3ML) 0.083% nebulizer solution Take 3 mLs (2.5 mg total) by nebulization every 6 (six) hours as needed for wheezing or shortness of breath. 07/14/16   [name], MD  ciprofloxacin (CIPRO) 500 MG tablet Take 1 tablet (500 mg total) by mouth every 12 (twelve) hours. 11/26/17   Tanna Furry, MD  cyclobenzaprine (FLEXERIL) 10 MG tablet Take 1 tablet (10 mg total) by mouth 3 (three) times daily. 09/03/17   Lily Kocher, PA-C  dexamethasone (DECADRON) 4 MG tablet Take 1 tablet (4 mg total) by mouth 2 (two) times daily with a meal. Patient not taking: Reported on 11/26/2017 09/03/17   Lily Kocher, PA-C  diclofenac (VOLTAREN) 75 MG EC tablet Take 1 tablet (75 mg total) by mouth  2 (two) times daily. Patient not taking: Reported on 11/26/2017 09/03/17   Lily Kocher, PA-C  HYDROcodone-acetaminophen (NORCO/VICODIN) 5-325 MG tablet Take one tab po q 4 hrs prn pain 01/14/18   Triplett, Tammy, PA-C  metroNIDAZOLE (FLAGYL) 500 MG tablet Take 1 tablet (500 mg total) by mouth 2 (two) times daily. 11/26/17   Tanna Furry, MD  omeprazole (PRILOSEC) 40 MG capsule Take 1 capsule (40 mg total) by mouth daily. 01/14/18   Triplett, Tammy, PA-C  ondansetron (ZOFRAN ODT) 4 MG disintegrating tablet Take 1 tablet (4 mg total) by mouth every 8 (eight) hours as needed for nausea. 11/26/17   Tanna Furry, MD    Family History Family History  Problem Relation Age of Onset  . Hypertension Mother     Social History Social History   Tobacco Use  . Smoking status: Current Every Day Smoker    Packs/day: 1.00  . Smokeless tobacco: Never Used  Substance Use Topics  . Alcohol use: Yes    Comment: occ  . Drug use: No     Allergies   Almond oil;  Carrot [daucus carota]; Celery oil; Coconut flavor; and Macadamia nut oil   Review of Systems Review of Systems  Constitutional: Negative for activity change, appetite change and fever.  HENT: Negative for congestion and rhinorrhea.   Eyes: Negative for visual disturbance.  Respiratory: Positive for chest tightness and shortness of breath.   Cardiovascular: Negative for chest pain.  Gastrointestinal: Positive for nausea and vomiting.  Genitourinary: Negative for dysuria, hematuria, vaginal bleeding and vaginal discharge.  Musculoskeletal: Positive for arthralgias and myalgias.  Skin: Positive for rash.  Neurological: Negative for dizziness, weakness, numbness and headaches.    all other systems are negative except as noted in the HPI and PMH.    Physical Exam Updated Vital Signs BP 106/79 (BP Location: Left Arm)   Pulse 72   Temp 97.9 F (36.6 C) (Oral)   Resp 16   Ht 5\' 1"  (1.549 m)   Wt 44 kg   SpO2 100%   BMI 18.33 kg/m   Physical Exam  Constitutional: She is oriented to person, place, and time. She appears well-developed and well-nourished. No distress.  Appears intoxicated  HENT:  Head: Normocephalic and atraumatic.  Mouth/Throat: Oropharynx is clear and moist. No oropharyngeal exudate.  Eyes: Pupils are equal, round, and reactive to light. Conjunctivae and EOM are normal.  Neck: Normal range of motion. Neck supple.  No meningismus.  Cardiovascular: Normal rate, regular rhythm, normal heart sounds and intact distal pulses.  No murmur heard. Pulmonary/Chest: Effort normal and breath sounds normal. No respiratory distress. She exhibits no tenderness.  Abdominal: Soft. There is no tenderness. There is no rebound and no guarding.  Musculoskeletal: Normal range of motion. She exhibits edema and tenderness.  Palms of hands are diffusely erythematous with mild swelling.  She is able to flex and extend all of her fingers.  Intact radial pulses. Small areas of cracking  along pads of 5th digits bilaterally  Neurological: She is alert and oriented to person, place, and time. No cranial nerve deficit. She exhibits normal muscle tone. Coordination normal.   5/5 strength throughout. CN 2-12 intact.Equal grip strength.   Skin: Skin is warm.  Psychiatric: She has a normal mood and affect. Her behavior is normal.  Nursing note and vitals reviewed.    ED Treatments / Results  Labs (all labs ordered are listed, but only abnormal results are displayed) Labs Reviewed  CBC WITH DIFFERENTIAL/PLATELET -  Abnormal; Notable for the following components:      Result Value   RBC 3.57 (*)    Hemoglobin 10.7 (*)    HCT 32.8 (*)    RDW 19.0 (*)    Monocytes Absolute 1.1 (*)    All other components within normal limits  COMPREHENSIVE METABOLIC PANEL - Abnormal; Notable for the following components:   AST 48 (*)    All other components within normal limits  ETHANOL - Abnormal; Notable for the following components:   Alcohol, Ethyl (B) 168 (*)    All other components within normal limits  LIPASE, BLOOD  TROPONIN I  D-DIMER, QUANTITATIVE (NOT AT [facility])  TROPONIN I  RAPID URINE DRUG SCREEN, HOSP PERFORMED    EKG EKG Interpretation  Date/Time:  Monday March 17 2018 03:54:35 EDT Ventricular Rate:  72 PR Interval:    QRS Duration: 81 QT Interval:  379 QTC Calculation: 415 R Axis:   42 Text Interpretation:  Sinus arrhythmia Low voltage, precordial leads No previous ECGs available Confirmed by Ezequiel Essex [phone]) on 03/17/2018 4:00:09 AM   Radiology Dg Chest 2 View  Result Date: 03/17/2018 CLINICAL DATA:  Initial evaluation for intermittent chest pain for several days. EXAM: CHEST - 2 VIEW COMPARISON:  Prior radiograph from 07/14/2016. FINDINGS: The cardiac and mediastinal silhouettes are stable in size and contour, and remain within normal limits. The lungs are normally inflated. No airspace consolidation, pleural effusion, or pulmonary edema is identified.  There is no pneumothorax. No acute osseous abnormality identified. IMPRESSION: No active cardiopulmonary disease. Electronically Signed   By: Jeannine Boga M.D.   On: 03/17/2018 05:09    Procedures Procedures (including critical care time)  Medications Ordered in ED Medications - No data to display   Initial Impression / Assessment and Plan / ED Course  I have reviewed the triage vital signs and the nursing notes.  Pertinent labs & imaging results that were available during my care of the patient were reviewed by me and considered in my medical decision making (see chart for details).  Clinical Course as of Mar 17 1002  Mon Mar 17, 2018  4403 2nd trop them home, atypical; most hands hurt from bleach.   [MB]    Clinical Course User Index [MB] Maudie Flakes, MD   Bilateral hand pain for several days now significantly worse after using Clorox bleach at home. Neurovascularly intact.  Hands are flushed on arrival.  Jewelry is removed.  EKG is nonischemic.  Patient has not had any chest pain since August 17.  Her description is somewhat atypical for ACS. LFts and lipase normal. Troponin negative. D-dimer negative. ETOH intoxication noted.  Hands flushed and rinsed multiple times. Surfactant used. Will give topical ointment and antibiotics.  Chest pain atypical for ACS, lasting several seconds at a time. Doubt PE or aortic dissection. Suspect GI in origin especially in setting of recent alcohol use. Will start PPI.  Second troponin pending at time of sign out to Dr. Sedonia Small. Anticipate discharge home if negative.  Final Clinical Impressions(s) / ED Diagnoses   Final diagnoses:  Atypical chest pain  Chemical burn    ED Discharge Orders    None       Efren Kross, Annie Main, MD 03/17/18 1008

## 2018-03-17 NOTE — ED Triage Notes (Addendum)
Pt reports on/off chest pain since Thursday. Pt reports red painful and dry hands that started Thursday but got worse after washing dishes with Clorox tonight. Pt hands smell of Clorox during triage. Pt reports taking ASA before bed last night. Pt denies chest pain at this time. Pt drove here. Pt has no one with her at this time.

## 2018-03-17 NOTE — Discharge Instructions (Addendum)
There is no evidence of heart attack or blood clot in the lung.  Take the stomach medication as prescribed.  Establish care with a primary doctor.  Do not use bleach to clean her dishes and do not use bleach without using gloves.  Return to the ED if you develop new or worsening symptoms.

## 2018-03-17 NOTE — ED Provider Notes (Signed)
  Provider Note MRN:  [id number]  Arrival date & time: 03/17/18    ED Course and Medical Decision Making  I received sign out for this patient at shift change from Dr, Wyvonnia Dusky.  Second troponin negative, prescription for Prilosec provided.  After the discussed management above, the patient was determined to be safe for discharge.  The patient was in agreement with this plan and all questions regarding their care were answered.  ED return precautions were discussed and the patient will return to the ED with any significant worsening of condition.  Barth Kirks. Sedonia Small, [place] [email or web address]     Maudie Flakes, MD 03/17/18 [phone]

## 2018-04-18 ENCOUNTER — Ambulatory Visit (INDEPENDENT_AMBULATORY_CARE_PROVIDER_SITE_OTHER): Payer: BLUE CROSS/BLUE SHIELD | Admitting: Gastroenterology

## 2018-04-18 ENCOUNTER — Other Ambulatory Visit: Payer: Self-pay | Admitting: *Deleted

## 2018-04-18 ENCOUNTER — Encounter: Payer: Self-pay | Admitting: *Deleted

## 2018-04-18 ENCOUNTER — Encounter: Payer: Self-pay | Admitting: Gastroenterology

## 2018-04-18 VITALS — BP 106/64 | HR 64 | Temp 97.5°F | Ht 61.0 in | Wt 99.0 lb

## 2018-04-18 DIAGNOSIS — R1013 Epigastric pain: Secondary | ICD-10-CM

## 2018-04-18 DIAGNOSIS — K5900 Constipation, unspecified: Secondary | ICD-10-CM | POA: Diagnosis not present

## 2018-04-18 DIAGNOSIS — K625 Hemorrhage of anus and rectum: Secondary | ICD-10-CM | POA: Diagnosis not present

## 2018-04-18 DIAGNOSIS — D649 Anemia, unspecified: Secondary | ICD-10-CM | POA: Diagnosis not present

## 2018-04-18 DIAGNOSIS — G8929 Other chronic pain: Secondary | ICD-10-CM

## 2018-04-18 NOTE — H&P (View-Only) (Signed)
Primary Care Physician:  Patient, No Pcp Per Primary Gastroenterologist:  Dr. Gala Romney   Chief Complaint  Patient presents with  . Abdominal Pain    constant pain across upper abd  . Nausea    w/ vomiting when she tries to eat  . Rectal Bleeding    d/t hemorrhoids, anytime she has a BM. Also when she "pushes something heavy at work it will flow out like a menstrual period", dark red  . Constipation    sometimes can be over 1 week before she has BM    HPI:   Marissa Frank is a 47 y.o. female presenting today at the request of the ED due to rectal bleeding, abdominal pain.    ETOH level 168 Aug 2019 (elevated). Elevated on multiple prior occasions. CMP reviewed from Aug 2019 with AST 48, persistent isolated elevation on multiple prior occasions. Chronic normocytic anemia.   CT abd/pelvis with contrast June 2019 in ED: mural thickening in antral pre-pyloric region of the stomach. Fibroid uterus. No colitis. CT April 2019 with mild edema surrounding descending colon, probably underlying colitis. No obvious perforation or abscess.   Rectal wall thickening on CT Aug 2017, ascending colonic wall thickening Nov 2017.   Notes upper abdominal pain for about a year. Difficulty sleeping at night. Pain wakes her up at night and has to hunch over. Chronic pain. Upper abdominal pain, wraps around to the sides. Takes Ibuprofen. No aspirin powders. History of drinking ETOH about a beer a day per patient but now unable to drink any longer (however alcohol level elevated recently). Scared to eat. Throws up daily. No weight loss.   Constipation: Can go up to a week or two without a BM. States she has hemorrhoids. Noted as darker blood. States chronic fever/chills. Denies typical reflux symptoms. Sometimes feels like food is getting stuck in mid chest. Will eat and then throw up. Prilosec didn't help.   Evidence of IDA on labs most recently with ferritin 4.   Past Medical History:  Diagnosis  Date  . Asthma   . Cancer (HCC)    cervical  . Depression   . Hemorrhoids     Past Surgical History:  Procedure Laterality Date  . BREAST SURGERY     benign tumor  . CERVICAL CONE BIOPSY    . TUBAL LIGATION    . WISDOM TOOTH EXTRACTION      Current Outpatient Medications  Medication Sig Dispense Refill  . albuterol (PROVENTIL) (2.5 MG/3ML) 0.083% nebulizer solution Take 3 mLs (2.5 mg total) by nebulization every 6 (six) hours as needed for wheezing or shortness of breath. 75 mL 12  . aspirin 325 MG tablet Take 325 mg by mouth daily.    . pantoprazole (PROTONIX) 40 MG tablet Take 1 tablet (40 mg total) by mouth daily. 30 minutes before breakfast 90 tablet 3   No current facility-administered medications for this visit.     Allergies as of 04/18/2018 - Review Complete 04/18/2018  Allergen Reaction Noted  . Almond oil Itching 03/30/2011  . Carrot [daucus carota] Itching 03/16/2015  . Celery oil Itching 03/16/2015  . Coconut flavor Nausea And Vomiting and Other (See Comments) 03/30/2011  . Macadamia nut oil Itching 03/30/2011    Family History  Problem Relation Age of Onset  . Hypertension Mother   . Colon cancer Neg Hx   . Colon polyps Neg Hx   . Stomach cancer Neg Hx   . Liver disease Neg Hx  Social History   Socioeconomic History  . Marital status: Single    Spouse name: Not on file  . Number of children: Not on file  . Years of education: Not on file  . Highest education level: Not on file  Occupational History  . Not on file  Social Needs  . Financial resource strain: Not on file  . Food insecurity:    Worry: Not on file    Inability: Not on file  . Transportation needs:    Medical: Not on file    Non-medical: Not on file  Tobacco Use  . Smoking status: Current Every Day Smoker    Packs/day: 1.00  . Smokeless tobacco: Never Used  Substance and Sexual Activity  . Alcohol use: Yes    Comment: occ  . Drug use: No  . Sexual activity: Yes    Birth  control/protection: Surgical  Lifestyle  . Physical activity:    Days per week: Not on file    Minutes per session: Not on file  . Stress: Not on file  Relationships  . Social connections:    Talks on phone: Not on file    Gets together: Not on file    Attends religious service: Not on file    Active member of club or organization: Not on file    Attends meetings of clubs or organizations: Not on file    Relationship status: Not on file  . Intimate partner violence:    Fear of current or ex partner: Not on file    Emotionally abused: Not on file    Physically abused: Not on file    Forced sexual activity: Not on file  Other Topics Concern  . Not on file  Social History Narrative  . Not on file    Review of Systems: As mentioned in HPI   Physical Exam: BP 106/64   Pulse 64   Temp (!) 97.5 F (36.4 C) (Oral)   Ht 5\' 1"  (1.549 m)   Wt 99 lb (44.9 kg)   LMP 04/14/2018   BMI 18.71 kg/m  General:   Alert and oriented. Pleasant and cooperative. Well-nourished and well-developed.  Head:  Normocephalic and atraumatic. Eyes:  Without icterus, sclera clear and conjunctiva pink.  Ears:  Normal auditory acuity. Nose:  No deformity, discharge,  or lesions. Mouth:  No deformity or lesions, oral mucosa pink.  Lungs:  Clear to auscultation bilaterally. No wheezes, rales, or rhonchi. No distress.  Heart:  S1, S2 present without murmurs appreciated.  Abdomen:  +BS, soft, TTP upper abdomen and non-distended. No HSM noted. No guarding or rebound. No masses appreciated.  Rectal:  Deferred  Msk:  Symmetrical without gross deformities. Normal posture. Extremities:  Without  edema. Neurologic:  Alert and  oriented x4 Skin:  Intact without significant lesions or rashes. Psych:  Alert and cooperative. Normal mood and affect.

## 2018-04-18 NOTE — Patient Instructions (Signed)
For constipation: start taking Linzess 1 capsule each morning on an empty stomach, 30 minutes before breakfast. Sometimes people will have loose stool for the first few days, but it should get better. Give it at least 4-5 days to see if it evens out.  Unfortunately, we don't have any samples of reflux medications here. I am sending in a medication that is normally covered well by insurance. It is Protonix, which you will take 30 minutes before breakfast daily.  We have arranged an upper endoscopy with possible dilation in the near future. IF you have worsening abdominal pain, vomiting blood, etc., go to the ED.  You will need a colonoscopy in the very near future, but we need to get your bowel regimen better. I also don't think you would tolerate drinking the liquid right now while you are having difficulty with upper GI symptoms.   Please have blood work done!  I will see you after the procedures back in the office.  It was a pleasure to see you today. I strive to create trusting relationships with patients to provide genuine, compassionate, and quality care. I value your feedback. If you receive a survey regarding your visit,  I greatly appreciate you taking time to fill this out.   Annitta Needs, PhD, ANP-BC [facility]

## 2018-04-18 NOTE — Patient Instructions (Signed)
OLINE BELK  04/18/2018     @PREFPERIOPPHARMACY @   Your procedure is scheduled on  04/24/2018   Report to [facility] at  1315  P.M.  Call this number if you have problems the morning of surgery:  [phone]   Remember:  Do not eat or drink after midnight.  You may drink clear liquids until  ( follow the instructions given to you) .  Clear liquids allowed are:                    Water, Juice (non-citric and without pulp), Carbonated beverages, Clear Tea, Black Coffee only, Plain Jell-O only, Gatorade and Plain Popsicles only    Take these medicines the morning of surgery with A SIP OF WATER  None. Use your nebulizer before you come.    Do not wear jewelry, make-up or nail polish.  Do not wear lotions, powders, or perfumes, or deodorant.  Do not shave 48 hours prior to surgery.  Men may shave face and neck.  Do not bring valuables to the hospital.  [facility] is not responsible for any belongings or valuables.  Contacts, dentures or bridgework may not be worn into surgery.  Leave your suitcase in the car.  After surgery it may be brought to your room.  For patients admitted to the hospital, discharge time will be determined by your treatment team.  Patients discharged the day of surgery will not be allowed to drive home.   Name and phone number of your driver:   family Special instructions:  Follow the diet and prep instructions given to you by Dr Roseanne Kaufman office.  Please read over the following fact sheets that you were given. Anesthesia Post-op Instructions and Care and Recovery After Surgery       Esophagogastroduodenoscopy Esophagogastroduodenoscopy (EGD) is a procedure to examine the lining of the esophagus, stomach, and first part of the small intestine (duodenum). This procedure is done to check for problems such as inflammation, bleeding, ulcers, or growths. During this procedure, a long, flexible, lighted tube with a camera attached  (endoscope) is inserted down the throat. Tell a health care provider about:  Any allergies you have.  All medicines you are taking, including vitamins, herbs, eye drops, creams, and over-the-counter medicines.  Any problems you or family members have had with anesthetic medicines.  Any blood disorders you have.  Any surgeries you have had.  Any medical conditions you have.  Whether you are pregnant or may be pregnant. What are the risks? Generally, this is a safe procedure. However, problems may occur, including:  Infection.  Bleeding.  A tear (perforation) in the esophagus, stomach, or duodenum.  Trouble breathing.  Excessive sweating.  Spasms of the larynx.  A slowed heartbeat.  Low blood pressure.  What happens before the procedure?  Follow instructions from your health care provider about eating or drinking restrictions.  Ask your health care provider about: ? Changing or stopping your regular medicines. This is especially important if you are taking diabetes medicines or blood thinners. ? Taking medicines such as aspirin and ibuprofen. These medicines can thin your blood. Do not take these medicines before your procedure if your health care provider instructs you not to.  Plan to have someone take you home after the procedure.  If you wear dentures, be ready to remove them before the procedure. What happens during the procedure?  To reduce your risk  of infection, your health care team will wash or sanitize their hands.  An IV tube will be put in a vein in your hand or arm. You will get medicines and fluids through this tube.  You will be given one or more of the following: ? A medicine to help you relax (sedative). ? A medicine to numb the area (local anesthetic). This medicine may be sprayed into your throat. It will make you feel more comfortable and keep you from gagging or coughing during the procedure. ? A medicine for pain.  A mouth guard may be  placed in your mouth to protect your teeth and to keep you from biting on the endoscope.  You will be asked to lie on your left side.  The endoscope will be lowered down your throat into your esophagus, stomach, and duodenum.  Air will be put into the endoscope. This will help your health care provider see better.  The lining of your esophagus, stomach, and duodenum will be examined.  Your health care provider may: ? Take a tissue sample so it can be looked at in a lab (biopsy). ? Remove growths. ? Remove objects (foreign bodies) that are stuck. ? Treat any bleeding with medicines or other devices that stop tissue from bleeding. ? Widen (dilate) or stretch narrowed areas of your esophagus and stomach.  The endoscope will be taken out. The procedure may vary among health care providers and hospitals. What happens after the procedure?  Your blood pressure, heart rate, breathing rate, and blood oxygen level will be monitored often until the medicines you were given have worn off.  Do not eat or drink anything until the numbing medicine has worn off and your gag reflex has returned. This information is not intended to replace advice given to you by your health care provider. Make sure you discuss any questions you have with your health care provider. Document Released: 11/16/2004 Document Revised: 12/22/2015 Document Reviewed: 06/09/2015 Elsevier Interactive Patient Education  2018 [employer]. Esophagogastroduodenoscopy, Care After Refer to this sheet in the next few weeks. These instructions provide you with information about caring for yourself after your procedure. Your health care provider may also give you more specific instructions. Your treatment has been planned according to current medical practices, but problems sometimes occur. Call your health care provider if you have any problems or questions after your procedure. What can I expect after the procedure? After the procedure,  it is common to have:  A sore throat.  Nausea.  Bloating.  Dizziness.  Fatigue.  Follow these instructions at home:  Do not eat or drink anything until the numbing medicine (local anesthetic) has worn off and your gag reflex has returned. You will know that the local anesthetic has worn off when you can swallow comfortably.  Do not drive for 24 hours if you received a medicine to help you relax (sedative).  If your health care provider took a tissue sample for testing during the procedure, make sure to get your test results. This is your responsibility. Ask your health care provider or the department performing the test when your results will be ready.  Keep all follow-up visits as told by your health care provider. This is important. Contact a health care provider if:  You cannot stop coughing.  You are not urinating.  You are urinating less than usual. Get help right away if:  You have trouble swallowing.  You cannot eat or drink.  You have throat or chest  pain that gets worse.  You are dizzy or light-headed.  You faint.  You have nausea or vomiting.  You have chills.  You have a fever.  You have severe abdominal pain.  You have black, tarry, or bloody stools. This information is not intended to replace advice given to you by your health care provider. Make sure you discuss any questions you have with your health care provider. Document Released: 07/02/2012 Document Revised: 12/22/2015 Document Reviewed: 06/09/2015 Elsevier Interactive Patient Education  2018 [employer].  Esophageal Dilatation Esophageal dilatation is a procedure to open a blocked or narrowed part of the esophagus. The esophagus is the long tube in your throat that carries food and liquid from your mouth to your stomach. The procedure is also called esophageal dilation. You may need this procedure if you have a buildup of scar tissue in your esophagus that makes it difficult, painful, or  even impossible to swallow. This can be caused by gastroesophageal reflux disease (GERD). In rare cases, people need this procedure because they have cancer of the esophagus or a problem with the way food moves through the esophagus. Sometimes you may need to have another dilatation to enlarge the opening of the esophagus gradually. Tell a health care provider about:  Any allergies you have.  All medicines you are taking, including vitamins, herbs, eye drops, creams, and over-the-counter medicines.  Any problems you or family members have had with anesthetic medicines.  Any blood disorders you have.  Any surgeries you have had.  Any medical conditions you have.  Any antibiotic medicines you are required to take before dental procedures. What are the risks? Generally, this is a safe procedure. However, problems can occur and include:  Bleeding from a tear in the lining of the esophagus.  A hole (perforation) in the esophagus.  What happens before the procedure?  Do not eat or drink anything after midnight on the night before the procedure or as directed by your health care provider.  Ask your health care provider about changing or stopping your regular medicines. This is especially important if you are taking diabetes medicines or blood thinners.  Plan to have someone take you home after the procedure. What happens during the procedure?  You will be given a medicine that makes you relaxed and sleepy (sedative).  A medicine may be sprayed or gargled to numb the back of the throat.  Your health care provider can use various instruments to do an esophageal dilatation. During the procedure, the instrument used will be placed in your mouth and passed down into your esophagus. Options include: ? Simple dilators. This instrument is carefully placed in the esophagus to stretch it. ? Guided wire bougies. In this method, a flexible tube (endoscope) is used to insert a wire into the  esophagus. The dilator is passed over this wire to enlarge the esophagus. Then the wire is removed. ? Balloon dilators. An endoscope with a small balloon at the end is passed down into the esophagus. Inflating the balloon gently stretches the esophagus and opens it up. What happens after the procedure?  Your blood pressure, heart rate, breathing rate, and blood oxygen level will be monitored often until the medicines you were given have worn off.  Your throat may feel slightly sore and will probably still feel numb. This will improve slowly over time.  You will not be allowed to eat or drink until the throat numbness has resolved.  If this is a same-day procedure, you may  be allowed to go home once you have been able to drink, urinate, and sit on the edge of the bed without nausea or dizziness.  If this is a same-day procedure, you should have a friend or family member with you for the next 24 hours after the procedure. This information is not intended to replace advice given to you by your health care provider. Make sure you discuss any questions you have with your health care provider. Document Released: 09/06/2005 Document Revised: 12/22/2015 Document Reviewed: 11/25/2013 Elsevier Interactive Patient Education  2018 [place] Anesthesia is a term that refers to techniques, procedures, and medicines that help a person stay safe and comfortable during a medical procedure. Monitored anesthesia care, or sedation, is one type of anesthesia. Your anesthesia specialist may recommend sedation if you will be having a procedure that does not require you to be unconscious, such as:  Cataract surgery.  A dental procedure.  A biopsy.  A colonoscopy.  During the procedure, you may receive a medicine to help you relax (sedative). There are three levels of sedation:  Mild sedation. At this level, you may feel awake and relaxed. You will be able to follow  directions.  Moderate sedation. At this level, you will be sleepy. You may not remember the procedure.  Deep sedation. At this level, you will be asleep. You will not remember the procedure.  The more medicine you are given, the deeper your level of sedation will be. Depending on how you respond to the procedure, the anesthesia specialist may change your level of sedation or the type of anesthesia to fit your needs. An anesthesia specialist will monitor you closely during the procedure. Let your health care provider know about:  Any allergies you have.  All medicines you are taking, including vitamins, herbs, eye drops, creams, and over-the-counter medicines.  Any use of steroids (by mouth or as a cream).  Any problems you or family members have had with sedatives and anesthetic medicines.  Any blood disorders you have.  Any surgeries you have had.  Any medical conditions you have, such as sleep apnea.  Whether you are pregnant or may be pregnant.  Any use of cigarettes, alcohol, or street drugs. What are the risks? Generally, this is a safe procedure. However, problems may occur, including:  Getting too much medicine (oversedation).  Nausea.  Allergic reaction to medicines.  Trouble breathing. If this happens, a breathing tube may be used to help with breathing. It will be removed when you are awake and breathing on your own.  Heart trouble.  Lung trouble.  Before the procedure Staying hydrated Follow instructions from your health care provider about hydration, which may include:  Up to 2 hours before the procedure - you may continue to drink clear liquids, such as water, clear fruit juice, black coffee, and plain tea.  Eating and drinking restrictions Follow instructions from your health care provider about eating and drinking, which may include:  8 hours before the procedure - stop eating heavy meals or foods such as meat, fried foods, or fatty foods.  6 hours  before the procedure - stop eating light meals or foods, such as toast or cereal.  6 hours before the procedure - stop drinking milk or drinks that contain milk.  2 hours before the procedure - stop drinking clear liquids.  Medicines Ask your health care provider about:  Changing or stopping your regular medicines. This is especially important if you are taking  diabetes medicines or blood thinners.  Taking medicines such as aspirin and ibuprofen. These medicines can thin your blood. Do not take these medicines before your procedure if your health care provider instructs you not to.  Tests and exams  You will have a physical exam.  You may have blood tests done to show: ? How well your kidneys and liver are working. ? How well your blood can clot.  General instructions  Plan to have someone take you home from the hospital or clinic.  If you will be going home right after the procedure, plan to have someone with you for 24 hours.  What happens during the procedure?  Your blood pressure, heart rate, breathing, level of pain and overall condition will be monitored.  An IV tube will be inserted into one of your veins.  Your anesthesia specialist will give you medicines as needed to keep you comfortable during the procedure. This may mean changing the level of sedation.  The procedure will be performed. After the procedure  Your blood pressure, heart rate, breathing rate, and blood oxygen level will be monitored until the medicines you were given have worn off.  Do not drive for 24 hours if you received a sedative.  You may: ? Feel sleepy, clumsy, or nauseous. ? Feel forgetful about what happened after the procedure. ? Have a sore throat if you had a breathing tube during the procedure. ? Vomit. This information is not intended to replace advice given to you by your health care provider. Make sure you discuss any questions you have with your health care provider. Document  Released: 04/11/2005 Document Revised: 12/23/2015 Document Reviewed: 11/06/2015 Elsevier Interactive Patient Education  2018 [place], Care After These instructions provide you with information about caring for yourself after your procedure. Your health care provider may also give you more specific instructions. Your treatment has been planned according to current medical practices, but problems sometimes occur. Call your health care provider if you have any problems or questions after your procedure. What can I expect after the procedure? After your procedure, it is common to:  Feel sleepy for several hours.  Feel clumsy and have poor balance for several hours.  Feel forgetful about what happened after the procedure.  Have poor judgment for several hours.  Feel nauseous or vomit.  Have a sore throat if you had a breathing tube during the procedure.  Follow these instructions at home: For at least 24 hours after the procedure:   Do not: ? Participate in activities in which you could fall or become injured. ? Drive. ? Use heavy machinery. ? Drink alcohol. ? Take sleeping pills or medicines that cause drowsiness. ? Make important decisions or sign legal documents. ? Take care of children on your own.  Rest. Eating and drinking  Follow the diet that is recommended by your health care provider.  If you vomit, drink water, juice, or soup when you can drink without vomiting.  Make sure you have little or no nausea before eating solid foods. General instructions  Have a responsible adult stay with you until you are awake and alert.  Take over-the-counter and prescription medicines only as told by your health care provider.  If you smoke, do not smoke without supervision.  Keep all follow-up visits as told by your health care provider. This is important. Contact a health care provider if:  You keep feeling nauseous or you keep  vomiting.  You feel light-headed.  You develop a rash.  You have a fever. Get help right away if:  You have trouble breathing. This information is not intended to replace advice given to you by your health care provider. Make sure you discuss any questions you have with your health care provider. Document Released: 11/06/2015 Document Revised: 03/07/2016 Document Reviewed: 11/06/2015 Elsevier Interactive Patient Education  [employer].

## 2018-04-18 NOTE — Progress Notes (Signed)
Primary Care Physician:  Patient, No Pcp Per Primary Gastroenterologist:  Dr. Gala Romney   Chief Complaint  Patient presents with  . Abdominal Pain    constant pain across upper abd  . Nausea    w/ vomiting when she tries to eat  . Rectal Bleeding    d/t hemorrhoids, anytime she has a BM. Also when she "pushes something heavy at work it will flow out like a menstrual period", dark red  . Constipation    sometimes can be over 1 week before she has BM    HPI:   Marissa Frank is a 47 y.o. female presenting today at the request of the ED due to rectal bleeding, abdominal pain.    ETOH level 168 Aug 2019 (elevated). Elevated on multiple prior occasions. CMP reviewed from Aug 2019 with AST 48, persistent isolated elevation on multiple prior occasions. Chronic normocytic anemia.   CT abd/pelvis with contrast June 2019 in ED: mural thickening in antral pre-pyloric region of the stomach. Fibroid uterus. No colitis. CT April 2019 with mild edema surrounding descending colon, probably underlying colitis. No obvious perforation or abscess.   Rectal wall thickening on CT Aug 2017, ascending colonic wall thickening Nov 2017.   Notes upper abdominal pain for about a year. Difficulty sleeping at night. Pain wakes her up at night and has to hunch over. Chronic pain. Upper abdominal pain, wraps around to the sides. Takes Ibuprofen. No aspirin powders. History of drinking ETOH about a beer a day per patient but now unable to drink any longer (however alcohol level elevated recently). Scared to eat. Throws up daily. No weight loss.   Constipation: Can go up to a week or two without a BM. States she has hemorrhoids. Noted as darker blood. States chronic fever/chills. Denies typical reflux symptoms. Sometimes feels like food is getting stuck in mid chest. Will eat and then throw up. Prilosec didn't help.   Evidence of IDA on labs most recently with ferritin 4.   Past Medical History:  Diagnosis  Date  . Asthma   . Cancer (HCC)    cervical  . Depression   . Hemorrhoids     Past Surgical History:  Procedure Laterality Date  . BREAST SURGERY     benign tumor  . CERVICAL CONE BIOPSY    . TUBAL LIGATION    . WISDOM TOOTH EXTRACTION      Current Outpatient Medications  Medication Sig Dispense Refill  . albuterol (PROVENTIL) (2.5 MG/3ML) 0.083% nebulizer solution Take 3 mLs (2.5 mg total) by nebulization every 6 (six) hours as needed for wheezing or shortness of breath. 75 mL 12  . aspirin 325 MG tablet Take 325 mg by mouth daily.    . pantoprazole (PROTONIX) 40 MG tablet Take 1 tablet (40 mg total) by mouth daily. 30 minutes before breakfast 90 tablet 3   No current facility-administered medications for this visit.     Allergies as of 04/18/2018 - Review Complete 04/18/2018  Allergen Reaction Noted  . Almond oil Itching 03/30/2011  . Carrot [daucus carota] Itching 03/16/2015  . Celery oil Itching 03/16/2015  . Coconut flavor Nausea And Vomiting and Other (See Comments) 03/30/2011  . Macadamia nut oil Itching 03/30/2011    Family History  Problem Relation Age of Onset  . Hypertension Mother   . Colon cancer Neg Hx   . Colon polyps Neg Hx   . Stomach cancer Neg Hx   . Liver disease Neg Hx  Social History   Socioeconomic History  . Marital status: Single    Spouse name: Not on file  . Number of children: Not on file  . Years of education: Not on file  . Highest education level: Not on file  Occupational History  . Not on file  Social Needs  . Financial resource strain: Not on file  . Food insecurity:    Worry: Not on file    Inability: Not on file  . Transportation needs:    Medical: Not on file    Non-medical: Not on file  Tobacco Use  . Smoking status: Current Every Day Smoker    Packs/day: 1.00  . Smokeless tobacco: Never Used  Substance and Sexual Activity  . Alcohol use: Yes    Comment: occ  . Drug use: No  . Sexual activity: Yes    Birth  control/protection: Surgical  Lifestyle  . Physical activity:    Days per week: Not on file    Minutes per session: Not on file  . Stress: Not on file  Relationships  . Social connections:    Talks on phone: Not on file    Gets together: Not on file    Attends religious service: Not on file    Active member of club or organization: Not on file    Attends meetings of clubs or organizations: Not on file    Relationship status: Not on file  . Intimate partner violence:    Fear of current or ex partner: Not on file    Emotionally abused: Not on file    Physically abused: Not on file    Forced sexual activity: Not on file  Other Topics Concern  . Not on file  Social History Narrative  . Not on file    Review of Systems: As mentioned in HPI   Physical Exam: BP 106/64   Pulse 64   Temp (!) 97.5 F (36.4 C) (Oral)   Ht 5\' 1"  (1.549 m)   Wt 99 lb (44.9 kg)   LMP 04/14/2018   BMI 18.71 kg/m  General:   Alert and oriented. Pleasant and cooperative. Well-nourished and well-developed.  Head:  Normocephalic and atraumatic. Eyes:  Without icterus, sclera clear and conjunctiva pink.  Ears:  Normal auditory acuity. Nose:  No deformity, discharge,  or lesions. Mouth:  No deformity or lesions, oral mucosa pink.  Lungs:  Clear to auscultation bilaterally. No wheezes, rales, or rhonchi. No distress.  Heart:  S1, S2 present without murmurs appreciated.  Abdomen:  +BS, soft, TTP upper abdomen and non-distended. No HSM noted. No guarding or rebound. No masses appreciated.  Rectal:  Deferred  Msk:  Symmetrical without gross deformities. Normal posture. Extremities:  Without  edema. Neurologic:  Alert and  oriented x4 Skin:  Intact without significant lesions or rashes. Psych:  Alert and cooperative. Normal mood and affect.

## 2018-04-19 LAB — CBC WITH DIFFERENTIAL/PLATELET
BASOS ABS: 71 {cells}/uL (ref 0–200)
BASOS PCT: 1.4 %
EOS PCT: 2.7 %
Eosinophils Absolute: 138 cells/uL (ref 15–500)
HEMATOCRIT: 33.8 % — AB (ref 35.0–45.0)
HEMOGLOBIN: 10.9 g/dL — AB (ref 11.7–15.5)
LYMPHS ABS: 1326 {cells}/uL (ref 850–3900)
MCH: 29.3 pg (ref 27.0–33.0)
MCHC: 32.2 g/dL (ref 32.0–36.0)
MCV: 90.9 fL (ref 80.0–100.0)
MONOS PCT: 18.4 %
MPV: 9.7 fL (ref 7.5–12.5)
NEUTROS ABS: 2627 {cells}/uL (ref 1500–7800)
Neutrophils Relative %: 51.5 %
Platelets: 465 10*3/uL — ABNORMAL HIGH (ref 140–400)
RBC: 3.72 10*6/uL — AB (ref 3.80–5.10)
RDW: 15.8 % — ABNORMAL HIGH (ref 11.0–15.0)
Total Lymphocyte: 26 %
WBC mixed population: 938 cells/uL (ref 200–950)
WBC: 5.1 10*3/uL (ref 3.8–10.8)

## 2018-04-19 LAB — IRON,TIBC AND FERRITIN PANEL
%SAT: 1 % — AB (ref 16–45)
FERRITIN: 10 ng/mL — AB (ref 16–232)
TIBC: 389 ug/dL (ref 250–450)

## 2018-04-22 ENCOUNTER — Encounter (HOSPITAL_COMMUNITY): Payer: Self-pay

## 2018-04-22 ENCOUNTER — Encounter (HOSPITAL_COMMUNITY)
Admission: RE | Admit: 2018-04-22 | Discharge: 2018-04-22 | Disposition: A | Discharge status: Home / Self Care | Payer: BLUE CROSS/BLUE SHIELD | Source: Ambulatory Visit | Attending: Internal Medicine | Admitting: Internal Medicine

## 2018-04-22 ENCOUNTER — Other Ambulatory Visit: Payer: Self-pay

## 2018-04-22 DIAGNOSIS — Z01812 Encounter for preprocedural laboratory examination: Secondary | ICD-10-CM | POA: Insufficient documentation

## 2018-04-22 LAB — BASIC METABOLIC PANEL
Anion gap: 9 (ref 5–15)
BUN: 8 mg/dL (ref 6–20)
CHLORIDE: 106 mmol/L (ref 98–111)
CO2: 20 mmol/L — AB (ref 22–32)
CREATININE: 0.59 mg/dL (ref 0.44–1.00)
Calcium: 9.4 mg/dL (ref 8.9–10.3)
GFR calc Af Amer: >60 mL/min (ref >60)
GFR calc non Af Amer: >60 mL/min (ref >60)
GLUCOSE: 102 mg/dL — AB (ref 70–99)
Potassium: 3.6 mmol/L (ref 3.5–5.1)
SODIUM: 135 mmol/L (ref 135–145)

## 2018-04-22 LAB — CBC WITH DIFFERENTIAL/PLATELET
Basophils Absolute: 0 10*3/uL (ref 0.0–0.1)
Basophils Relative: 1 %
EOS ABS: 0.1 10*3/uL (ref 0.0–0.7)
Eosinophils Relative: 2 %
HCT: 32.3 % — ABNORMAL LOW (ref 36.0–46.0)
HEMOGLOBIN: 10.3 g/dL — AB (ref 12.0–15.0)
Lymphocytes Relative: 21 %
Lymphs Abs: 1.2 10*3/uL (ref 0.7–4.0)
MCH: 29.1 pg (ref 26.0–34.0)
MCHC: 31.9 g/dL (ref 30.0–36.0)
MCV: 91.2 fL (ref 78.0–100.0)
MONOS PCT: 18 %
Monocytes Absolute: 1 10*3/uL (ref 0.1–1.0)
NEUTROS PCT: 58 %
Neutro Abs: 3.2 10*3/uL (ref 1.7–7.7)
Platelets: 446 10*3/uL — ABNORMAL HIGH (ref 150–400)
RBC: 3.54 MIL/uL — ABNORMAL LOW (ref 3.87–5.11)
RDW: 17.2 % — ABNORMAL HIGH (ref 11.5–15.5)
WBC: 5.4 10*3/uL (ref 4.0–10.5)

## 2018-04-22 LAB — IRON AND TIBC
Iron: 15 ug/dL — ABNORMAL LOW (ref 28–170)
Saturation Ratios: 3 % — ABNORMAL LOW (ref 10.4–31.8)
TIBC: 429 ug/dL (ref 250–450)
UIBC: 414 ug/dL

## 2018-04-22 LAB — FERRITIN: FERRITIN: 4 ng/mL — AB (ref 11–307)

## 2018-04-24 ENCOUNTER — Encounter (HOSPITAL_COMMUNITY): Payer: Self-pay

## 2018-04-24 ENCOUNTER — Ambulatory Visit (HOSPITAL_COMMUNITY): Payer: BLUE CROSS/BLUE SHIELD | Admitting: Anesthesiology

## 2018-04-24 ENCOUNTER — Ambulatory Visit (HOSPITAL_COMMUNITY)
Admission: RE | Admit: 2018-04-24 | Discharge: 2018-04-24 | Disposition: A | Discharge status: Home / Self Care | Payer: BLUE CROSS/BLUE SHIELD | Source: Ambulatory Visit | Attending: Internal Medicine | Admitting: Internal Medicine

## 2018-04-24 ENCOUNTER — Encounter (HOSPITAL_COMMUNITY)
Admission: RE | Disposition: A | Discharge status: Home / Self Care | Payer: Self-pay | Source: Ambulatory Visit | Attending: Internal Medicine

## 2018-04-24 DIAGNOSIS — Z79899 Other long term (current) drug therapy: Secondary | ICD-10-CM | POA: Insufficient documentation

## 2018-04-24 DIAGNOSIS — K210 Gastro-esophageal reflux disease with esophagitis: Secondary | ICD-10-CM | POA: Diagnosis not present

## 2018-04-24 DIAGNOSIS — K319 Disease of stomach and duodenum, unspecified: Secondary | ICD-10-CM | POA: Diagnosis not present

## 2018-04-24 DIAGNOSIS — R933 Abnormal findings on diagnostic imaging of other parts of digestive tract: Secondary | ICD-10-CM | POA: Diagnosis not present

## 2018-04-24 DIAGNOSIS — J45909 Unspecified asthma, uncomplicated: Secondary | ICD-10-CM | POA: Insufficient documentation

## 2018-04-24 DIAGNOSIS — R1310 Dysphagia, unspecified: Secondary | ICD-10-CM | POA: Insufficient documentation

## 2018-04-24 DIAGNOSIS — F329 Major depressive disorder, single episode, unspecified: Secondary | ICD-10-CM | POA: Diagnosis not present

## 2018-04-24 DIAGNOSIS — K259 Gastric ulcer, unspecified as acute or chronic, without hemorrhage or perforation: Secondary | ICD-10-CM | POA: Diagnosis not present

## 2018-04-24 DIAGNOSIS — Z8541 Personal history of malignant neoplasm of cervix uteri: Secondary | ICD-10-CM | POA: Insufficient documentation

## 2018-04-24 DIAGNOSIS — K449 Diaphragmatic hernia without obstruction or gangrene: Secondary | ICD-10-CM | POA: Diagnosis not present

## 2018-04-24 DIAGNOSIS — F17210 Nicotine dependence, cigarettes, uncomplicated: Secondary | ICD-10-CM | POA: Diagnosis not present

## 2018-04-24 DIAGNOSIS — K228 Other specified diseases of esophagus: Secondary | ICD-10-CM | POA: Diagnosis not present

## 2018-04-24 DIAGNOSIS — K5900 Constipation, unspecified: Secondary | ICD-10-CM | POA: Insufficient documentation

## 2018-04-24 DIAGNOSIS — G8929 Other chronic pain: Secondary | ICD-10-CM

## 2018-04-24 DIAGNOSIS — Z7982 Long term (current) use of aspirin: Secondary | ICD-10-CM | POA: Insufficient documentation

## 2018-04-24 DIAGNOSIS — R1013 Epigastric pain: Secondary | ICD-10-CM

## 2018-04-24 HISTORY — PX: ESOPHAGOGASTRODUODENOSCOPY (EGD) WITH PROPOFOL: SHX5813

## 2018-04-24 HISTORY — PX: BIOPSY: SHX5522

## 2018-04-24 HISTORY — PX: MALONEY DILATION: SHX5535

## 2018-04-24 SURGERY — ESOPHAGOGASTRODUODENOSCOPY (EGD) WITH PROPOFOL
Anesthesia: Monitor Anesthesia Care

## 2018-04-24 MED ORDER — MEPERIDINE HCL 100 MG/ML IJ SOLN: 6.2500 mg | INTRAMUSCULAR | Status: DC | PRN

## 2018-04-24 MED ORDER — LACTATED RINGERS IV SOLN: INTRAVENOUS | Status: DC

## 2018-04-24 MED ORDER — HYDROMORPHONE HCL 1 MG/ML IJ SOLN: 0.2500 mg | INTRAMUSCULAR | Status: DC | PRN

## 2018-04-24 MED ORDER — PROPOFOL 10 MG/ML IV BOLUS
INTRAVENOUS | Status: DC | PRN
  Administered 2018-04-24: 100 mg via INTRAVENOUS
  Administered 2018-04-24: 40 mg via INTRAVENOUS
  Administered 2018-04-24: 20 mg via INTRAVENOUS

## 2018-04-24 MED ORDER — HYDROCODONE-ACETAMINOPHEN 7.5-325 MG PO TABS: 1.0000 | ORAL_TABLET | Freq: Once | ORAL | Status: DC | PRN

## 2018-04-24 MED ORDER — LACTATED RINGERS IV SOLN
INTRAVENOUS | Status: DC
  Administered 2018-04-24: 14:00:00 via INTRAVENOUS

## 2018-04-24 MED ORDER — CHLORHEXIDINE GLUCONATE CLOTH 2 % EX PADS: 6.0000 | MEDICATED_PAD | Freq: Once | CUTANEOUS | Status: DC

## 2018-04-24 MED ORDER — PROMETHAZINE HCL 25 MG/ML IJ SOLN: 6.2500 mg | INTRAMUSCULAR | Status: DC | PRN

## 2018-04-24 MED ORDER — PANTOPRAZOLE SODIUM 40 MG PO TBEC: 40.0000 mg | DELAYED_RELEASE_TABLET | Freq: Every day | ORAL | 3 refills | Status: DC

## 2018-04-24 MED ORDER — PROPOFOL 500 MG/50ML IV EMUL
INTRAVENOUS | Status: DC | PRN
  Administered 2018-04-24 (×2): 125 ug/kg/min via INTRAVENOUS

## 2018-04-24 NOTE — Assessment & Plan Note (Signed)
47 year old female with epigastric pain for approximately one year, with recent CT June 2019 in ED with mural thickening in antral pre-pyloric region of the stomach. She notes nocturnal waking with pain, vomiting daily, taking ibuprofen and history of drinking ETOH daily. Vomiting daily. No weight loss. Needs diagnostic EGD in near future. She has also not been on a PPI. Plans were to send Protonix into the pharmacy the day of visit, but after review of labs done recently, I noted this had not been received. I have resent Protonix again. Intermittent dysphagia also noted.   Proceed with upper endoscopy/dilation in the near future with Dr. Gala Romney. The risks, benefits, and alternatives have been discussed in detail with patient. They have stated understanding and desire to proceed.  Propofol due to polypharmacy Will need to start Protonix I also note as of 9/26 while reviewing labs, that she has aspirin 325 mg listed. Will need to avoid this and other NSAIDs.       I also note that 325 mg aspirin was not part of her medication list at time of visit last week, but now it is listed: unclear if this is something she has been taking routinely or not.

## 2018-04-24 NOTE — Interval H&P Note (Signed)
History and Physical Interval Note:  04/24/2018 1:53 PM  Marissa Frank  has presented today for surgery, with the diagnosis of abdominal pain  The various methods of treatment have been discussed with the patient and family. After consideration of risks, benefits and other options for treatment, the patient has consented to  Procedure(s) with comments: ESOPHAGOGASTRODUODENOSCOPY (EGD) WITH PROPOFOL (N/A) - 2:30pm - LM for pt to arrive at 12:15 [place] (N/A) as a surgical intervention .  The patient's history has been reviewed, patient examined, no change in status, stable for surgery.  I have reviewed the patient's chart and labs.  Questions were answered to the patient's satisfaction.     Marissa Frank  No change.  EGD with ED, etc. as feasible/appropriate per plan.  The risks, benefits, limitations, alternatives and imponderables have been reviewed with the patient. Potential for esophageal dilation, biopsy, etc. have also been reviewed.  Questions have been answered. All parties agreeable.

## 2018-04-24 NOTE — Op Note (Signed)
[facility] Patient Name: Marissa Frank Procedure Date: 04/24/2018 1:38 PM MRN: [id number] Date of Birth: 1971-06-18 Attending MD: Norvel Richards , MD CSN: [id number] Age: 47 Admit Type: Outpatient Procedure:                Upper GI endoscopy Indications:              Dysphagia, Abnormal CT of the GI tract Providers:                Norvel Richards, MD, Janeece Riggers, RN, Aram Candela Referring MD:              Medicines:                Propofol per Anesthesia Complications:            No immediate complications. Estimated Blood Loss:     Estimated blood loss was minimal. Procedure:                Pre-Anesthesia Assessment:                           - Prior to the procedure, a History and Physical                            was performed, and patient medications and                            allergies were reviewed. The patient's tolerance of                            previous anesthesia was also reviewed. The risks                            and benefits of the procedure and the sedation                            options and risks were discussed with the patient.                            All questions were answered, and informed consent                            was obtained. Prior Anticoagulants: The patient has                            taken no previous anticoagulant or antiplatelet                            agents. ASA Grade Assessment: II - A patient with                            mild systemic disease. After reviewing the risks  and benefits, the patient was deemed in                            satisfactory condition to undergo the procedure.                           After obtaining informed consent, the endoscope was                            passed under direct vision. Throughout the                            procedure, the patient's blood pressure, pulse, and                             oxygen saturations were monitored continuously. The                            GIF-H190 ([id number]) scope was introduced through the                            and advanced to the second part of duodenum. The                            upper GI endoscopy was accomplished without                            difficulty. The patient tolerated the procedure                            well. Scope In: 2:03:25 PM Scope Out: 2:13:36 PM Total Procedure Duration: 0 hours 10 minutes 11 seconds  Findings:      Couple of tiny erosions within 5 mm of the GE junction. Tubular       esophagus appeared patent throughout its course. No Barrett's epithelium       seen.      A small hiatal hernia was present. Edematous antral mucosa. (2) 6 mm       cratered antral/prepyloric ulcers. Surrounding erosions. No obvious       ulcer or infiltrating process seen.      The duodenal bulb and second portion of the duodenum were normal. The       scope was withdrawn. Dilation was performed with a Maloney dilator with       mild resistance at 35 Fr. The dilation site was examined following       endoscope reinsertion and showed mild mucosal disruption. Estimated       blood loss was minimal. Finally, the abnormal antrum was biopsied with a       cold forceps for histology. Estimated blood loss was minimal. Impression:               - Mucosal changes in the esophagus -reflux                            esophagitis. Dilated.                           -  Small hiatal hernia. Abnormal antrum with                            multiple small gastric ulcers -biopsied.                           - Normal duodenal bulb and second portion of the                            duodenum.                           - No specimens collected. Moderate Sedation:      Moderate (conscious) sedation was personally administered by an       anesthesia professional. The following parameters were monitored: oxygen       saturation, heart rate, blood  pressure, respiratory rate, EKG, adequacy       of pulmonary ventilation, and response to care. Total physician       intraservice time was 15 minutes. Recommendation:           - Patient has a contact number available for                            emergencies. The signs and symptoms of potential                            delayed complications were discussed with the                            patient. Return to normal activities tomorrow.                            Written discharge instructions were provided to the                            patient.                           - Resume previous diet.                           - Continue present medications.                           - Repeat upper endoscopy in 3 months for                            surveillance. Increase Protonix to 40 mg twice                            daily. Stop consuming alcohol and using NSAIDs                           - Return to GI clinic in 3 months. Procedure Code(s):        --- Professional ---  40347, Esophagogastroduodenoscopy, flexible,                            transoral; with biopsy, single or multiple                           43450, Dilation of esophagus, by unguided sound or                            bougie, single or multiple passes Diagnosis Code(s):        --- Professional ---                           K22.8, Other specified diseases of esophagus                           K44.9, Diaphragmatic hernia without obstruction or                            gangrene                           R13.10, Dysphagia, unspecified                           R93.3, Abnormal findings on diagnostic imaging of                            other parts of digestive tract CPT copyright 2017 American Medical Association. All rights reserved. The codes documented in this report are preliminary and upon coder review may  be revised to meet current compliance requirements. Marissa Frank. Marissa Taha,  MD Norvel Richards, MD 04/24/2018 2:24:30 PM This report has been signed electronically. Number of Addenda: 0

## 2018-04-24 NOTE — Assessment & Plan Note (Signed)
Will need colonoscopy in near future, but she is vomiting daily and with significant upper GI symptoms so prep is not feasible at this time. History of colitis April 2019 Aug 2017. Will need diagnostic colonoscopy when able to drink prep. Discussed importance of this with patient. Return after EGD to arrange.

## 2018-04-24 NOTE — Anesthesia Preprocedure Evaluation (Signed)
Anesthesia Evaluation  Patient identified by MRN, date of birth, ID band Patient awake    Reviewed: Allergy & Precautions, H&P , NPO status , Patient's Chart, lab work & pertinent test results, reviewed documented beta blocker date and time   Airway Mallampati: II  TM Distance: >3 FB Neck ROM: full    Dental no notable dental hx. (+) Teeth Intact, Dental Advidsory Given   Pulmonary neg pulmonary ROS, asthma , Current Smoker,    Pulmonary exam normal breath sounds clear to auscultation       Cardiovascular Exercise Tolerance: Good negative cardio ROS   Rhythm:regular Rate:Normal     Neuro/Psych Depression negative neurological ROS  negative psych ROS   GI/Hepatic negative GI ROS, Neg liver ROS,   Endo/Other  negative endocrine ROS  Renal/GU negative Renal ROS  negative genitourinary   Musculoskeletal   Abdominal   Peds  Hematology negative hematology ROS (+) anemia ,   Anesthesia Other Findings Epigastric pain with ongoing N/V Denies any sig PMH  Reproductive/Obstetrics negative OB ROS                             Anesthesia Physical Anesthesia Plan  ASA: II  Anesthesia Plan: MAC   Post-op Pain Management:    Induction:   PONV Risk Score and Plan:   Airway Management Planned:   Additional Equipment:   Intra-op Plan:   Post-operative Plan:   Informed Consent: I have reviewed the patients History and Physical, chart, labs and discussed the procedure including the risks, benefits and alternatives for the proposed anesthesia with the patient or authorized representative who has indicated his/her understanding and acceptance.   Dental Advisory Given  Plan Discussed with: CRNA and Anesthesiologist  Anesthesia Plan Comments:         Anesthesia Quick Evaluation

## 2018-04-24 NOTE — Assessment & Plan Note (Signed)
Evidence of IDA on labs most recently, ferritin 4. EGD as planned with colonoscopy to be arranged shortly thereafter. Hgb stable.

## 2018-04-24 NOTE — Transfer of Care (Signed)
Immediate Anesthesia Transfer of Care Note  Patient: Marissa Frank  Procedure(s) Performed: ESOPHAGOGASTRODUODENOSCOPY (EGD) WITH PROPOFOL (N/A ) MALONEY DILATION (N/A ) BIOPSY  Patient Location: PACU  Anesthesia Type:MAC  Level of Consciousness: awake and patient cooperative  Airway & Oxygen Therapy: Patient Spontanous Breathing  Post-op Assessment: Report given to RN and Post -op Vital signs reviewed and stable  Post vital signs: Reviewed and stable  Last Vitals:  Vitals Value Taken Time  BP    Temp    Pulse    Resp    SpO2      Last Pain:  Vitals:   04/24/18 1241  TempSrc: Oral  PainSc: 5          Complications: No apparent anesthesia complications

## 2018-04-24 NOTE — Anesthesia Postprocedure Evaluation (Signed)
Anesthesia Post Note  Patient: Marissa Frank  Procedure(s) Performed: ESOPHAGOGASTRODUODENOSCOPY (EGD) WITH PROPOFOL (N/A ) MALONEY DILATION (N/A ) BIOPSY  Patient location during evaluation: PACU Anesthesia Type: MAC Level of consciousness: awake and alert and patient cooperative Pain management: satisfactory to patient Vital Signs Assessment: post-procedure vital signs reviewed and stable Respiratory status: spontaneous breathing Cardiovascular status: stable Postop Assessment: no apparent nausea or vomiting Anesthetic complications: no     Last Vitals:  Vitals:   04/24/18 1430 04/24/18 1446  BP: 110/77 114/68  Pulse: 60 (!) 57  Resp: 14 16  Temp:  36.6 C  SpO2: 100% 98%    Last Pain:  Vitals:   04/24/18 1446  TempSrc: Oral  PainSc: 2                  Cate Oravec

## 2018-04-24 NOTE — Discharge Instructions (Signed)
Information on peptic ulcer disease and GERD provided  Increase Protonix to 40 mg twice daily  Stop consuming alcohol and all forms of nonsteroidal agents such as Goody powders and ibuprofen  Office visit with [place] in 3 months  Further recommendations to follow pending review of pathology report.  EGD Discharge instructions Please read the instructions outlined below and refer to this sheet in the next few weeks. These discharge instructions provide you with general information on caring for yourself after you leave the hospital. Your doctor may also give you specific instructions. While your treatment has been planned according to the most current medical practices available, unavoidable complications occasionally occur. If you have any problems or questions after discharge, please call your doctor. ACTIVITY  You may resume your regular activity but move at a slower pace for the next 24 hours.   Take frequent rest periods for the next 24 hours.   Walking will help expel (get rid of) the air and reduce the bloated feeling in your abdomen.   No driving for 24 hours (because of the anesthesia (medicine) used during the test).   You may shower.   Do not sign any important legal documents or operate any machinery for 24 hours (because of the anesthesia used during the test).  NUTRITION  Drink plenty of fluids.   You may resume your normal diet.   Begin with a light meal and progress to your normal diet.   Avoid alcoholic beverages for 24 hours or as instructed by your caregiver.  MEDICATIONS  You may resume your normal medications unless your caregiver tells you otherwise.  WHAT YOU CAN EXPECT TODAY  You may experience abdominal discomfort such as a feeling of fullness or gas pains.  FOLLOW-UP  Your doctor will discuss the results of your test with you.  SEEK IMMEDIATE MEDICAL ATTENTION IF ANY OF THE FOLLOWING OCCUR:  Excessive nausea (feeling sick to your stomach) and/or  vomiting.   Severe abdominal pain and distention (swelling).   Trouble swallowing.   Temperature over 101 F (37.8 C).   Rectal bleeding or vomiting of blood.    Gastroesophageal Reflux Disease, Adult Normally, food travels down the esophagus and stays in the stomach to be digested. However, when a person has gastroesophageal reflux disease (GERD), food and stomach acid move back up into the esophagus. When this happens, the esophagus becomes sore and inflamed. Over time, GERD can create small holes (ulcers) in the lining of the esophagus. What are the causes? This condition is caused by a problem with the muscle between the esophagus and the stomach (lower esophageal sphincter, or LES). Normally, the LES muscle closes after food passes through the esophagus to the stomach. When the LES is weakened or abnormal, it does not close properly, and that allows food and stomach acid to go back up into the esophagus. The LES can be weakened by certain dietary substances, medicines, and medical conditions, including:  Tobacco use.  Pregnancy.  Having a hiatal hernia.  Heavy alcohol use.  Certain foods and beverages, such as coffee, chocolate, onions, and peppermint.  What increases the risk? This condition is more likely to develop in:  People who have an increased body weight.  People who have connective tissue disorders.  People who use NSAID medicines.  What are the signs or symptoms? Symptoms of this condition include:  Heartburn.  Difficult or painful swallowing.  The feeling of having a lump in the throat.  Abitter taste in the mouth.  Bad breath.  Having a large amount of saliva.  Having an upset or bloated stomach.  Belching.  Chest pain.  Shortness of breath or wheezing.  Ongoing (chronic) cough or a night-time cough.  Wearing away of tooth enamel.  Weight loss.  Different conditions can cause chest pain. Make sure to see your health care provider if  you experience chest pain. How is this diagnosed? Your health care provider will take a medical history and perform a physical exam. To determine if you have mild or severe GERD, your health care provider may also monitor how you respond to treatment. You may also have other tests, including:  An endoscopy toexamine your stomach and esophagus with a small camera.  A test thatmeasures the acidity level in your esophagus.  A test thatmeasures how much pressure is on your esophagus.  A barium swallow or modified barium swallow to show the shape, size, and functioning of your esophagus.  How is this treated? The goal of treatment is to help relieve your symptoms and to prevent complications. Treatment for this condition may vary depending on how severe your symptoms are. Your health care provider may recommend:  Changes to your diet.  Medicine.  Surgery.  Follow these instructions at home: Diet  Follow a diet as recommended by your health care provider. This may involve avoiding foods and drinks such as: ? Coffee and tea (with or without caffeine). ? Drinks that containalcohol. ? Energy drinks and sports drinks. ? Carbonated drinks or sodas. ? Chocolate and cocoa. ? Peppermint and mint flavorings. ? Garlic and onions. ? Horseradish. ? Spicy and acidic foods, including peppers, chili powder, curry powder, vinegar, hot sauces, and barbecue sauce. ? Citrus fruit juices and citrus fruits, such as oranges, lemons, and limes. ? Tomato-based foods, such as red sauce, chili, salsa, and pizza with red sauce. ? Fried and fatty foods, such as donuts, french fries, potato chips, and high-fat dressings. ? High-fat meats, such as hot dogs and fatty cuts of red and white meats, such as rib eye steak, sausage, ham, and bacon. ? High-fat dairy items, such as whole milk, butter, and cream cheese.  Eat small, frequent meals instead of large meals.  Avoid drinking large amounts of liquid with  your meals.  Avoid eating meals during the 2-3 hours before bedtime.  Avoid lying down right after you eat.  Do not exercise right after you eat. General instructions  Pay attention to any changes in your symptoms.  Take over-the-counter and prescription medicines only as told by your health care provider. Do not take aspirin, ibuprofen, or other NSAIDs unless your health care provider told you to do so.  Do not use any tobacco products, including cigarettes, chewing tobacco, and e-cigarettes. If you need help quitting, ask your health care provider.  Wear loose-fitting clothing. Do not wear anything tight around your waist that causes pressure on your abdomen.  Raise (elevate) the head of your bed 6 inches (15cm).  Try to reduce your stress, such as with yoga or meditation. If you need help reducing stress, ask your health care provider.  If you are overweight, reduce your weight to an amount that is healthy for you. Ask your health care provider for guidance about a safe weight loss goal.  Keep all follow-up visits as told by your health care provider. This is important. Contact a health care provider if:  You have new symptoms.  You have unexplained weight loss.  You have difficulty swallowing, or  it hurts to swallow.  You have wheezing or a persistent cough.  Your symptoms do not improve with treatment.  You have a hoarse voice. Get help right away if:  You have pain in your arms, neck, jaw, teeth, or back.  You feel sweaty, dizzy, or light-headed.  You have chest pain or shortness of breath.  You vomit and your vomit looks like blood or coffee grounds.  You faint.  Your stool is bloody or black.  You cannot swallow, drink, or eat. This information is not intended to replace advice given to you by your health care provider. Make sure you discuss any questions you have with your health care provider. Document Released: 04/25/2005 Document Revised: 12/14/2015  Document Reviewed: 11/10/2014 Elsevier Interactive Patient Education  2018 [employer].   Peptic Ulcer A peptic ulcer is a painful sore in the lining of your esophagus, stomach, or the first part of your small intestine. You may have pain in the area between your chest and your belly button. The most common causes of an ulcer are:  An infection.  Using certain pain medicines too often or too much.  Follow these instructions at home:  Avoid alcohol.  Avoid caffeine.  Do not use any tobacco products. These include cigarettes, chewing tobacco, and e-cigarettes. If you need help quitting, ask your doctor.  Take over-the-counter and prescription medicines only as told by your doctor. Do not stop or change your medicines unless you talk with your doctor about it first.  Keep all follow-up visits as told by your doctor. This is important. Contact a doctor if:  You do not get better in 7 days after you start treatment.  You keep having an upset stomach (indigestion) or heartburn. Get help right away if:  You have sudden, sharp pain in your belly (abdomen).  You have lasting belly pain.  You have bloody poop (stool) or black, tarry poop.  You throw up (vomit) blood. It may look like coffee grounds.  You feel light-headed or feel like you may pass out (faint).  You get weak.  You get sweaty or feel sticky and cold to the touch (clammy). This information is not intended to replace advice given to you by your health care provider. Make sure you discuss any questions you have with your health care provider. Document Released: 10/10/2009 Document Revised: 11/30/2015 Document Reviewed: 04/16/2015 Elsevier Interactive Patient Education  [employer].

## 2018-04-24 NOTE — Anesthesia Procedure Notes (Signed)
Procedure Name: MAC Date/Time: 04/24/2018 1:56 PM Performed by: Vista Deck, CRNA Pre-anesthesia Checklist: Patient identified, Emergency Drugs available, Suction available, Timeout performed and Patient being monitored Patient Re-evaluated:Patient Re-evaluated prior to induction Oxygen Delivery Method: Nasal Cannula

## 2018-04-24 NOTE — Progress Notes (Signed)
No pcp

## 2018-04-24 NOTE — Assessment & Plan Note (Signed)
Start Linzess 290 mcg once daily, samples provided.

## 2018-04-28 ENCOUNTER — Encounter (HOSPITAL_COMMUNITY): Payer: Self-pay | Admitting: Internal Medicine

## 2018-05-07 ENCOUNTER — Encounter: Payer: Self-pay | Admitting: Internal Medicine

## 2018-05-29 ENCOUNTER — Emergency Department (HOSPITAL_COMMUNITY)
Admission: EM | Admit: 2018-05-29 | Discharge: 2018-05-29 | Disposition: A | Discharge status: Home / Self Care | Payer: BLUE CROSS/BLUE SHIELD | Attending: Emergency Medicine | Admitting: Emergency Medicine

## 2018-05-29 ENCOUNTER — Emergency Department (HOSPITAL_COMMUNITY): Payer: BLUE CROSS/BLUE SHIELD

## 2018-05-29 ENCOUNTER — Encounter (HOSPITAL_COMMUNITY): Payer: Self-pay | Admitting: Emergency Medicine

## 2018-05-29 ENCOUNTER — Other Ambulatory Visit: Payer: Self-pay

## 2018-05-29 DIAGNOSIS — K6289 Other specified diseases of anus and rectum: Secondary | ICD-10-CM | POA: Diagnosis not present

## 2018-05-29 DIAGNOSIS — K644 Residual hemorrhoidal skin tags: Secondary | ICD-10-CM | POA: Insufficient documentation

## 2018-05-29 DIAGNOSIS — F17200 Nicotine dependence, unspecified, uncomplicated: Secondary | ICD-10-CM | POA: Insufficient documentation

## 2018-05-29 DIAGNOSIS — K625 Hemorrhage of anus and rectum: Secondary | ICD-10-CM | POA: Diagnosis present

## 2018-05-29 DIAGNOSIS — J45909 Unspecified asthma, uncomplicated: Secondary | ICD-10-CM | POA: Diagnosis not present

## 2018-05-29 DIAGNOSIS — Z79899 Other long term (current) drug therapy: Secondary | ICD-10-CM | POA: Diagnosis not present

## 2018-05-29 LAB — CBC WITH DIFFERENTIAL/PLATELET
Abs Immature Granulocytes: 0.01 10*3/uL (ref 0.00–0.07)
Basophils Absolute: 0.1 10*3/uL (ref 0.0–0.1)
Basophils Relative: 1 %
EOS ABS: 0.1 10*3/uL (ref 0.0–0.5)
EOS PCT: 3 %
HEMATOCRIT: 35.8 % — AB (ref 36.0–46.0)
HEMOGLOBIN: 11.2 g/dL — AB (ref 12.0–15.0)
Immature Granulocytes: 0 %
LYMPHS ABS: 1.2 10*3/uL (ref 0.7–4.0)
LYMPHS PCT: 24 %
MCH: 27.7 pg (ref 26.0–34.0)
MCHC: 31.3 g/dL (ref 30.0–36.0)
MCV: 88.6 fL (ref 80.0–100.0)
MONO ABS: 0.8 10*3/uL (ref 0.1–1.0)
Monocytes Relative: 16 %
Neutro Abs: 2.7 10*3/uL (ref 1.7–7.7)
Neutrophils Relative %: 56 %
Platelets: 432 10*3/uL — ABNORMAL HIGH (ref 150–400)
RBC: 4.04 MIL/uL (ref 3.87–5.11)
RDW: 17.8 % — AB (ref 11.5–15.5)
WBC: 4.9 10*3/uL (ref 4.0–10.5)
nRBC: 0 % (ref 0.0–0.2)

## 2018-05-29 LAB — BASIC METABOLIC PANEL
Anion gap: 12 (ref 5–15)
BUN: 6 mg/dL (ref 6–20)
CHLORIDE: 108 mmol/L (ref 98–111)
CO2: 19 mmol/L — ABNORMAL LOW (ref 22–32)
CREATININE: 0.72 mg/dL (ref 0.44–1.00)
Calcium: 9.1 mg/dL (ref 8.9–10.3)
GFR calc Af Amer: >60 mL/min (ref >60)
GFR calc non Af Amer: >60 mL/min (ref >60)
GLUCOSE: 78 mg/dL (ref 70–99)
Potassium: 4.5 mmol/L (ref 3.5–5.1)
Sodium: 139 mmol/L (ref 135–145)

## 2018-05-29 LAB — SAMPLE TO BLOOD BANK

## 2018-05-29 LAB — I-STAT BETA HCG BLOOD, ED (MC, WL, AP ONLY): I-stat hCG, quantitative: <5 m[IU]/mL (ref <5)

## 2018-05-29 MED ORDER — HYDROCORTISONE 2.5 % RE CREA: TOPICAL_CREAM | RECTAL | 0 refills | Status: DC

## 2018-05-29 MED ORDER — PRAMOXINE HCL 1 % RE FOAM
Freq: Three times a day (TID) | RECTAL | Status: DC | PRN
  Administered 2018-05-29: 02:00:00 via RECTAL
  Filled 2018-05-29 (×2): qty 15

## 2018-05-29 MED ORDER — PANTOPRAZOLE SODIUM 40 MG PO TBEC: 40.0000 mg | DELAYED_RELEASE_TABLET | Freq: Two times a day (BID) | ORAL | 0 refills | Status: DC

## 2018-05-29 MED ORDER — IOHEXOL 300 MG/ML  SOLN
75.0000 mL | Freq: Once | INTRAMUSCULAR | Status: AC | PRN
  Administered 2018-05-29: 75 mL via INTRAVENOUS

## 2018-05-29 MED ORDER — SODIUM CHLORIDE 0.9 % IV BOLUS
1000.0000 mL | Freq: Once | INTRAVENOUS | Status: AC
  Administered 2018-05-29: 1000 mL via INTRAVENOUS

## 2018-05-29 MED ORDER — LIDOCAINE VISCOUS HCL 2 % MT SOLN
15.0000 mL | Freq: Once | OROMUCOSAL | Status: AC
  Administered 2018-05-29: 15 mL via OROMUCOSAL
  Filled 2018-05-29: qty 15

## 2018-05-29 MED ORDER — FENTANYL CITRATE (PF) 100 MCG/2ML IJ SOLN
50.0000 ug | Freq: Once | INTRAMUSCULAR | Status: AC
  Administered 2018-05-29: 50 ug via INTRAVENOUS
  Filled 2018-05-29: qty 2

## 2018-05-29 MED ORDER — SODIUM CHLORIDE 0.9 % IV SOLN
INTRAVENOUS | Status: DC | PRN
  Administered 2018-05-29: 500 mL via INTRAVENOUS

## 2018-05-29 MED ORDER — ONDANSETRON HCL 4 MG/2ML IJ SOLN
4.0000 mg | Freq: Once | INTRAMUSCULAR | Status: AC
  Administered 2018-05-29: 4 mg via INTRAVENOUS
  Filled 2018-05-29: qty 2

## 2018-05-29 MED ORDER — FAMOTIDINE IN NACL 20-0.9 MG/50ML-% IV SOLN
20.0000 mg | Freq: Once | INTRAVENOUS | Status: AC
  Administered 2018-05-29: 20 mg via INTRAVENOUS
  Filled 2018-05-29: qty 50

## 2018-05-29 NOTE — Discharge Instructions (Addendum)
Dr. Gala Romney had advised you to quit drinking alcohol, including wine, taking aspirin products or over-the-counter Motrin, Aleve, ibuprofen, naproxen.  These will all keep your stomach inflamed and painful.  He also wanted you to take the stomach acid medication he had prescribed for you before.  I gave you another prescription tonight.  I also gave you some cream to put on your large inflamed hemorrhoid.  Please follow-up with Dr. Gala Romney to see about treatment for your hemorrhoid.  Return to the emergency department if you have heavy bleeding or start feeling weak, dizzy, or short of breath.

## 2018-05-29 NOTE — ED Triage Notes (Signed)
Pt c/o sudden dark red bleeding started today. Has saturated 3 pads.  Hx of hemorrhoids.  A/o. llq abd pain.

## 2018-05-29 NOTE — ED Provider Notes (Signed)
[facility] EMERGENCY DEPARTMENT Provider Note   CSN: [id number] Arrival date & time: 05/29/18  0044  Time seen 01:00 AM   History   Chief Complaint Chief Complaint  Patient presents with  . Rectal Bleeding    HPI Marissa Frank is a 47 y.o. female.  HPI patient states sometimes she can go 2 to 3 weeks without having a bowel movement and sometimes she does get some bleeding from constipation.  She states earlier today she had 2 normal bowel movements.  She states about an hour before arrival she was trying to step over a coffee table and she fell and hit her rectal area on the edge of the table and since then she has been having bleeding.  She complains of a lot of rectal pain.  Since she fell she has had the urge to have a bowel movement and had blood come out about 3 times.  Patient states she had a endoscopy done recently about 3 weeks ago by Dr. Gala Romney.  She states there was no changes made in her medical plan.  She states they were going to do a colonoscopy however she was having lots of vomiting and they did not do that.  She is aware of having hemorrhoids since she was a teenager.  She states sometimes they "squirt blood out".  Patient does admit to drinking wine tonight.  Has an appointment later today with a dermatologist due to allergic reaction to some insects that she is exposed to at work.  PCP Patient, No Pcp Per   Past Medical History:  Diagnosis Date  . Asthma   . Cancer (HCC)    cervical  . Depression   . Hemorrhoids     Patient Active Problem List   Diagnosis Date Noted  . Constipation 04/18/2018  . Rectal bleeding 04/18/2018  . Normocytic anemia 04/18/2018  . Abdominal pain, chronic, epigastric 04/18/2018    Past Surgical History:  Procedure Laterality Date  . BIOPSY  04/24/2018   Procedure: BIOPSY;  Surgeon: Daneil Dolin, MD;  Location: [facility];  Service: Endoscopy;;  . BREAST SURGERY     benign tumor  . CERVICAL CONE BIOPSY    .  ESOPHAGOGASTRODUODENOSCOPY (EGD) WITH PROPOFOL N/A 04/24/2018   Procedure: ESOPHAGOGASTRODUODENOSCOPY (EGD) WITH PROPOFOL;  Surgeon: Daneil Dolin, MD;  Location: [facility];  Service: Endoscopy;  Laterality: N/A;  2:30pm - LM for pt to arrive at 12:15  . MALONEY DILATION N/A 04/24/2018   Procedure: Venia Minks DILATION;  Surgeon: Daneil Dolin, MD;  Location: [facility];  Service: Endoscopy;  Laterality: N/A;  . TUBAL LIGATION    . WISDOM TOOTH EXTRACTION       OB History    Gravida  3   Para  2   Term  2   Preterm      AB  1   Living        SAB      TAB  1   Ectopic      Multiple      Live Births               Home Medications    Patient denies taking any medication on a regular basis, she states she sometimes takes aspirin  Prior to Admission medications   Medication Sig Start Date End Date Taking? Authorizing Provider  albuterol (PROVENTIL) (2.5 MG/3ML) 0.083% nebulizer solution Take 3 mLs (2.5 mg total) by nebulization every 6 (six) hours as  needed for wheezing or shortness of breath. 07/14/16  Yes [name], MD  aspirin 325 MG tablet Take 325 mg by mouth daily.    [provider]  hydrocortisone (ANUSOL-HC) 2.5 % rectal cream Apply rectally 2 times daily 05/29/18   Rolland Porter, MD  pantoprazole (PROTONIX) 40 MG tablet Take 1 tablet (40 mg total) by mouth 2 (two) times daily before a meal. 30 minutes before breakfast 05/29/18   Rolland Porter, MD    Family History Family History  Problem Relation Age of Onset  . Hypertension Mother   . Colon cancer Neg Hx   . Colon polyps Neg Hx   . Stomach cancer Neg Hx   . Liver disease Neg Hx     Social History Social History   Tobacco Use  . Smoking status: Current Every Day Smoker    Packs/day: 1.00  . Smokeless tobacco: Never Used  Substance Use Topics  . Alcohol use: Yes    Comment: occ  . Drug use: No  employed Patient admits to drinking wine tonight  Allergies   Almond oil; Carrot  [daucus carota]; Celery oil; Coconut flavor; and Macadamia nut oil   Review of Systems Review of Systems  All other systems reviewed and are negative.    Physical Exam Updated Vital Signs BP 107/75   Pulse 77   Temp 98.5 F (36.9 C) (Oral)   Resp 18   LMP 05/23/2018   SpO2 100%   Vital signs normal    Physical Exam  Constitutional: She is oriented to person, place, and time. She appears well-developed and well-nourished. She appears distressed.  HENT:  Head: Normocephalic and atraumatic.  Right Ear: External ear normal.  Left Ear: External ear normal.  Nose: Nose normal.  Mouth/Throat: Oropharynx is clear and moist.  Eyes: Pupils are equal, round, and reactive to light. Conjunctivae and EOM are normal.  Neck: Normal range of motion.  Cardiovascular: Normal rate.  Pulmonary/Chest: Effort normal. No respiratory distress.  Genitourinary:  Genitourinary Comments: Patient is wearing a sanitary pad over her rectal area.  When I examine her rectal area she has a large hemorrhoid that appears to have an area of bleeding in the central location.  I suspect this may be where she sustained her trauma and the bleeding may be from this hemorrhoid.  Musculoskeletal: Normal range of motion. She exhibits no tenderness.  Neurological: She is alert and oriented to person, place, and time. No cranial nerve deficit.  Skin: Skin is warm and dry. No erythema.  Psychiatric: She has a normal mood and affect. Her behavior is normal. Thought content normal.       ED Treatments / Results  Labs (all labs ordered are listed, but only abnormal results are displayed)  Results for orders placed or performed during the hospital encounter of 05/29/18  CBC with Differential  Result Value Ref Range   WBC 4.9 4.0 - 10.5 K/uL   RBC 4.04 3.87 - 5.11 MIL/uL   Hemoglobin 11.2 (L) 12.0 - 15.0 g/dL   HCT 35.8 (L) 36.0 - 46.0 %   MCV 88.6 80.0 - 100.0 fL   MCH 27.7 26.0 - 34.0 pg   MCHC 31.3 30.0 -  36.0 g/dL   RDW 17.8 (H) 11.5 - 15.5 %   Platelets 432 (H) 150 - 400 K/uL   nRBC 0.0 0.0 - 0.2 %   Neutrophils Relative % 56 %   Neutro Abs 2.7 1.7 - 7.7 K/uL   Lymphocytes Relative 24 %  Lymphs Abs 1.2 0.7 - 4.0 K/uL   Monocytes Relative 16 %   Monocytes Absolute 0.8 0.1 - 1.0 K/uL   Eosinophils Relative 3 %   Eosinophils Absolute 0.1 0.0 - 0.5 K/uL   Basophils Relative 1 %   Basophils Absolute 0.1 0.0 - 0.1 K/uL   Immature Granulocytes 0 %   Abs Immature Granulocytes 0.01 0.00 - 0.07 K/uL  Basic metabolic panel  Result Value Ref Range   Sodium 139 135 - 145 mmol/L   Potassium 4.5 3.5 - 5.1 mmol/L   Chloride 108 98 - 111 mmol/L   CO2 19 (L) 22 - 32 mmol/L   Glucose, Bld 78 70 - 99 mg/dL   BUN 6 6 - 20 mg/dL   Creatinine, Ser 0.72 0.44 - 1.00 mg/dL   Calcium 9.1 8.9 - 10.3 mg/dL   GFR calc non Af Amer >60 >60 mL/min   GFR calc Af Amer >60 >60 mL/min   Anion gap 12 5 - 15  I-Stat beta hCG blood, ED  Result Value Ref Range   I-stat hCG, quantitative <5.0 <5 mIU/mL   Comment 3          Sample to Blood Bank  Result Value Ref Range   Blood Bank Specimen SAMPLE AVAILABLE FOR TESTING    Sample Expiration      06/01/2018 Performed at [facility], [address] [place], [place] [address]    Laboratory interpretation all normal except mildly low CO2 with normal anion gap, mild stable anemia   EKG None  Radiology Ct Abdomen Pelvis W Contrast  Result Date: 05/29/2018 CLINICAL DATA:  48 year old female with history of hemorrhoid presenting with rectal bleeding after fall. Left lower quadrant abdominal pain. EXAM: CT ABDOMEN AND PELVIS WITH CONTRAST TECHNIQUE: Multidetector CT imaging of the abdomen and pelvis was performed using the standard protocol following bolus administration of intravenous contrast. CONTRAST:  52mL OMNIPAQUE IOHEXOL 300 MG/ML  SOLN COMPARISON:  CT of the abdomen pelvis dated 01/14/2018 FINDINGS: Lower chest: The visualized lung bases are clear. No  intra-abdominal free air or free fluid. Hepatobiliary: No focal liver abnormality is seen. No gallstones, gallbladder wall thickening, or biliary dilatation. Pancreas: Unremarkable. No pancreatic ductal dilatation or surrounding inflammatory changes. Spleen: Normal in size without focal abnormality. Adrenals/Urinary Tract: The adrenal glands are unremarkable. The kidneys, visualized ureters, and urinary bladder appear unremarkable as well. Stomach/Bowel: Thickened appearance of the distal stomach and gastric antrum similar to prior CT. This may be related to underdistention although gastritis is not excluded. Clinical correlation is recommended. There is no bowel obstruction. There is protrusion of the anal fold with hyperemia of the mucosa consistent with known hemorrhoids. Correlation with clinical exam recommended. No drainable fluid collection. Normal appendix. Vascular/Lymphatic: No significant vascular findings are present. No enlarged abdominal or pelvic lymph nodes. Reproductive: Enlarged uterus with multiple fibroids. The largest fibroid measures approximately 6 cm in the anterior body. The ovaries are grossly unremarkable. A tampon is noted. Other: Small fat containing umbilical hernia. Musculoskeletal: No acute or significant osseous findings. IMPRESSION: 1. Protruded and hyperemic appearance of the anal mucosa consistent with provided history of hemorrhoids. Clinical correlation is recommended. 2. Under distention of the stomach versus gastritis of the distal body/antrum. Clinical correlation is recommended. No bowel obstruction. Normal appendix. 3. Fibroid uterus. Electronically Signed   By: Anner Crete M.D.   On: 05/29/2018 04:32    Procedures Procedures (including critical care time)  Upper endoscopy was done on September 26 by Dr.  Rourk, showing mucosal changes in the esophagus consistent with reflux esophagitis, dilatation of the esophagus, small hiatal hernia, abnormal antrum with  multiple small gastric ulcers that were biopsied, normal duodenal bulb and second portion of the duodenum.  It was recommended that she stop drinking alcohol, and avoid nonsteroidal anti-inflammatory drugs, she was told to increase her Protonix to 40 mg twice a day.  Medications Ordered in ED Medications  pramoxine (PROCTOFOAM) 1 % foam ( Rectal Given 05/29/18 0212)  0.9 %  sodium chloride infusion (500 mLs Intravenous New Bag/Given 05/29/18 0446)  sodium chloride 0.9 % bolus 1,000 mL (0 mLs Intravenous Stopped 05/29/18 0423)  fentaNYL (SUBLIMAZE) injection 50 mcg (50 mcg Intravenous Given 05/29/18 0138)  ondansetron (ZOFRAN) injection 4 mg (4 mg Intravenous Given 05/29/18 0138)  iohexol (OMNIPAQUE) 300 MG/ML solution 75 mL (75 mLs Intravenous Contrast Given 05/29/18 0357)  lidocaine (XYLOCAINE) 2 % viscous mouth solution 15 mL (15 mLs Mouth/Throat Given 05/29/18 0423)  famotidine (PEPCID) IVPB 20 mg premix (0 mg Intravenous Stopped 05/29/18 0532)     Initial Impression / Assessment and Plan / ED Course  I have reviewed the triage vital signs and the nursing notes.  Pertinent labs & imaging results that were available during my care of the patient were reviewed by me and considered in my medical decision making (see chart for details).    Patient was given IV pain and nausea medication.  She was advised if she went to the bathroom to use the hat so we can see how much blood she is passing.  Recheck at 3:30 AM patient states she has not had any rectal bleeding.  She states she has had a small amount of bleeding on her pad from her hemorrhoid.  She now tells me she has been having some left lower quadrant pain that started also after her fall.  CT of the abdomen pelvis was ordered.  When I review the gastroenterologist notes she had been complaining of upper abdominal pain which is why they did the endoscopy.  Nurse reports patient was complaining of increasing pain in her abdomen after she  returned from CT scan having reviewed her endoscopy she was given Pepcid IV.  I go back to talk to the patient after her CT has resulted I could not tell she was falling asleep when I was talking to her or just ignoring what I was saying.  Her CT scan did not reveal any etiology for her left lower quadrant pain.  This was explained to the patient.  Also explained I read her endoscopy report and she had been advised to quit drinking, did stop taking aspirin, and she was supposed to be on the Protonix twice a day which she has not followed through for any of the recommendations.  I am going to have her follow-up with gastroenterology, she has a large hemorrhoid and may require banding.  She was restarted back on her PPI.    Review of the [place] only shows one prescription written in June 2019 from our ED for #6 hydrocodone.  Final Clinical Impressions(s) / ED Diagnoses   Final diagnoses:  Bleeding external hemorrhoids    ED Discharge Orders         Ordered    pantoprazole (PROTONIX) 40 MG tablet  2 times daily before meals     05/29/18 0618    hydrocortisone (ANUSOL-HC) 2.5 % rectal cream     05/29/18 0618  Plan discharge  Rolland Porter, MD, Barbette Or, MD 05/29/18 [phone]

## 2018-06-02 ENCOUNTER — Telehealth: Payer: Self-pay | Admitting: Internal Medicine

## 2018-06-02 NOTE — Telephone Encounter (Signed)
[phone] patient said the linzess samples worked and wants a prescription sent to [facility] on [address].  Also please call patient, she is having some nausea and wants to know if it is from the pills.

## 2018-06-02 NOTE — Telephone Encounter (Signed)
Lmom, waiting on a return call.  

## 2018-06-02 NOTE — Telephone Encounter (Signed)
AB pt left a message saying the Linzess medication worked well and she would like medication sent into her pharmacy.

## 2018-06-03 MED ORDER — LINACLOTIDE 290 MCG PO CAPS: 290.0000 ug | ORAL_CAPSULE | Freq: Every day | ORAL | 3 refills | Status: DC

## 2018-06-03 NOTE — Telephone Encounter (Signed)
Completed.

## 2018-06-03 NOTE — Addendum Note (Signed)
Addended by: Annitta Needs on: 06/03/2018 09:54 AM   Modules accepted: Orders

## 2018-06-23 ENCOUNTER — Encounter: Payer: Self-pay | Admitting: Gastroenterology

## 2018-06-23 ENCOUNTER — Telehealth: Payer: Self-pay | Admitting: Gastroenterology

## 2018-06-23 ENCOUNTER — Ambulatory Visit: Payer: BLUE CROSS/BLUE SHIELD | Admitting: Gastroenterology

## 2018-06-23 NOTE — Telephone Encounter (Signed)
PATIENT WAS A NO SHOW AND LETTER SENT  °

## 2018-06-29 ENCOUNTER — Emergency Department (HOSPITAL_COMMUNITY)
Admission: EM | Admit: 2018-06-29 | Discharge: 2018-06-29 | Disposition: A | Discharge status: Home / Self Care | Payer: BLUE CROSS/BLUE SHIELD | Attending: Emergency Medicine | Admitting: Emergency Medicine

## 2018-06-29 ENCOUNTER — Emergency Department (HOSPITAL_COMMUNITY): Payer: BLUE CROSS/BLUE SHIELD

## 2018-06-29 ENCOUNTER — Encounter (HOSPITAL_COMMUNITY): Payer: Self-pay | Admitting: *Deleted

## 2018-06-29 DIAGNOSIS — Z79899 Other long term (current) drug therapy: Secondary | ICD-10-CM | POA: Insufficient documentation

## 2018-06-29 DIAGNOSIS — J069 Acute upper respiratory infection, unspecified: Secondary | ICD-10-CM | POA: Insufficient documentation

## 2018-06-29 DIAGNOSIS — Z7982 Long term (current) use of aspirin: Secondary | ICD-10-CM | POA: Diagnosis not present

## 2018-06-29 DIAGNOSIS — J45909 Unspecified asthma, uncomplicated: Secondary | ICD-10-CM | POA: Insufficient documentation

## 2018-06-29 DIAGNOSIS — B9789 Other viral agents as the cause of diseases classified elsewhere: Secondary | ICD-10-CM

## 2018-06-29 DIAGNOSIS — R05 Cough: Secondary | ICD-10-CM | POA: Diagnosis present

## 2018-06-29 LAB — GROUP A STREP BY PCR: GROUP A STREP BY PCR: NOT DETECTED

## 2018-06-29 MED ORDER — ALBUTEROL SULFATE HFA 108 (90 BASE) MCG/ACT IN AERS
1.0000 | INHALATION_SPRAY | RESPIRATORY_TRACT | Status: DC | PRN
  Administered 2018-06-29: 2 via RESPIRATORY_TRACT
  Filled 2018-06-29: qty 6.7

## 2018-06-29 MED ORDER — ACETAMINOPHEN 325 MG PO TABS
650.0000 mg | ORAL_TABLET | Freq: Once | ORAL | Status: AC
  Administered 2018-06-29: 650 mg via ORAL
  Filled 2018-06-29: qty 2

## 2018-06-29 MED ORDER — ALBUTEROL SULFATE (2.5 MG/3ML) 0.083% IN NEBU
2.5000 mg | INHALATION_SOLUTION | Freq: Once | RESPIRATORY_TRACT | Status: AC
  Administered 2018-06-29: 2.5 mg via RESPIRATORY_TRACT
  Filled 2018-06-29: qty 3

## 2018-06-29 MED ORDER — AEROCHAMBER PLUS FLO-VU LARGE MISC
1.0000 | Freq: Once | Status: AC
  Administered 2018-06-29: 1

## 2018-06-29 NOTE — ED Notes (Signed)
Awaiting eval  

## 2018-06-29 NOTE — ED Notes (Signed)
Resp paged

## 2018-06-29 NOTE — ED Provider Notes (Addendum)
[facility] EMERGENCY DEPARTMENT Provider Note   CSN: [id number] Arrival date & time: 06/29/18  1116     History   Chief Complaint Chief Complaint  Patient presents with  . URI    HPI Marissa Frank is a 47 y.o. female with history of asthma presenting today for 10 days of rhinorrhea, congestion and cough.  Patient states that her symptoms have gradually progressed for the past 10 days she states that her cough is constant and productive with white sputum.  Patient states that she has not used albuterol since last year for her asthma because she has not needed to however she states that she feels that she is in need of a breathing treatment today. Patient states that her constant cough is causing her some tightness to her chest.  Patient states that she does not have tightness when she is not coughing.  Patient also endorses a mild sore throat that she describes as a burning sensation worse when she swallows.  Additionally patient endorses generalized body aches that have been present for the past week, denies focal area of pain.  Patient states that she felt warm a few days ago as well as yesterday however did not measure temperature.  Patient denies hemoptysis, recent surgery/trauma, history of blood clot, history of cancer, recent immobilization, extremity swelling, exogenous hormone use.  Patient denies any other concerns at this time. HPI  Past Medical History:  Diagnosis Date  . Asthma   . Cancer (HCC)    cervical  . Depression   . Hemorrhoids     Patient Active Problem List   Diagnosis Date Noted  . Constipation 04/18/2018  . Rectal bleeding 04/18/2018  . Normocytic anemia 04/18/2018  . Abdominal pain, chronic, epigastric 04/18/2018    Past Surgical History:  Procedure Laterality Date  . BIOPSY  04/24/2018   Procedure: BIOPSY;  Surgeon: Daneil Dolin, MD;  Location: [facility];  Service: Endoscopy;;  . BREAST SURGERY     benign tumor  . CERVICAL CONE  BIOPSY    . ESOPHAGOGASTRODUODENOSCOPY (EGD) WITH PROPOFOL N/A 04/24/2018   Procedure: ESOPHAGOGASTRODUODENOSCOPY (EGD) WITH PROPOFOL;  Surgeon: Daneil Dolin, MD;  Location: [facility];  Service: Endoscopy;  Laterality: N/A;  2:30pm - LM for pt to arrive at 12:15  . MALONEY DILATION N/A 04/24/2018   Procedure: Venia Minks DILATION;  Surgeon: Daneil Dolin, MD;  Location: [facility] ENDO [facility]ITE;  Service: Endoscopy;  Laterality: N/A;  . TUBAL LIGATION    . WISDOM TOOTH EXTRACTION       OB History    Gravida  3   Para  2   Term  2   Preterm      AB  1   Living        SAB      TAB  1   Ectopic      Multiple      Live Births               Home Medications    Prior to Admission medications   Medication Sig Start Date End Date Taking? Authorizing Provider  albuterol (PROVENTIL) (2.5 MG/3ML) 0.083% nebulizer solution Take 3 mLs (2.5 mg total) by nebulization every 6 (six) hours as needed for wheezing or shortness of breath. 07/14/16   [name], MD  aspirin 325 MG tablet Take 325 mg by mouth daily.    [provider]  hydrocortisone (ANUSOL-HC) 2.5 % rectal cream Apply rectally 2 times daily  05/29/18   Rolland Porter, MD  linaclotide [facility]) 290 MCG CAPS capsule Take 1 capsule (290 mcg total) by mouth daily before breakfast. 06/03/18   Annitta Needs, NP  pantoprazole (PROTONIX) 40 MG tablet Take 1 tablet (40 mg total) by mouth 2 (two) times daily before a meal. 30 minutes before breakfast 05/29/18   Rolland Porter, MD    Family History Family History  Problem Relation Age of Onset  . Hypertension Mother   . Colon cancer Neg Hx   . Colon polyps Neg Hx   . Stomach cancer Neg Hx   . Liver disease Neg Hx     Social History Social History   Tobacco Use  . Smoking status: Current Every Day Smoker    Packs/day: 1.00    Types: Cigarettes  . Smokeless tobacco: Never Used  Substance Use Topics  . Alcohol use: Yes    Comment: occ  . Drug use: No     Allergies     Almond oil; Carrot [daucus carota]; Celery oil; Coconut flavor; and Macadamia nut oil   Review of Systems Review of Systems  Constitutional: Negative.  Negative for chills and fever.  HENT: Positive for congestion, postnasal drip, rhinorrhea and sore throat. Negative for drooling, ear pain, facial swelling, trouble swallowing and voice change.   Respiratory: Positive for cough and chest tightness (Only with cough). Negative for shortness of breath.   Cardiovascular: Negative.  Negative for chest pain, palpitations and leg swelling.  Gastrointestinal: Negative.  Negative for abdominal pain, diarrhea, nausea and vomiting.  Musculoskeletal: Positive for arthralgias and myalgias. Negative for neck pain.  Skin: Negative.  Negative for rash.  Neurological: Negative.  Negative for dizziness, syncope, weakness, light-headedness, numbness and headaches.   Physical Exam Updated Vital Signs BP 112/76 (BP Location: Right Arm)   Pulse 61   Temp 97.9 F (36.6 C) (Oral)   Resp 18   Ht 5\' 1"  (1.549 m)   Wt 44.3 kg   LMP 06/29/2018   SpO2 100%   BMI 18.43 kg/m   Physical Exam  Constitutional: She appears well-developed and well-nourished. No distress.  HENT:  Head: Normocephalic and atraumatic.  Right Ear: Hearing, tympanic membrane, external ear and ear canal normal.  Left Ear: Hearing, tympanic membrane, external ear and ear canal normal.  Nose: Mucosal edema and rhinorrhea present. Right sinus exhibits no maxillary sinus tenderness and no frontal sinus tenderness. Left sinus exhibits no maxillary sinus tenderness and no frontal sinus tenderness.  Mouth/Throat: Uvula is midline, oropharynx is clear and moist and mucous membranes are normal.  The patient has normal phonation and is in control of secretions. No stridor.  Midline uvula without edema. Soft palate rises symmetrically. No tonsillar erythema, swelling or exudates. Tongue protrusion is normal, floor of mouth is soft. No trismus. No  creptius on neck palpation. No gingival erythema or fluctuance noted. Mucus membranes moist.   Posterior oropharynx cobblestoning consistent with postnasal drip  Eyes: Pupils are equal, round, and reactive to light. Conjunctivae and EOM are normal.  Neck: Trachea normal, normal range of motion, full passive range of motion without pain and phonation normal. Neck supple. No tracheal tenderness present. No tracheal deviation present.  Cardiovascular: Normal rate, regular rhythm, normal heart sounds, intact distal pulses and normal pulses.  Pulses:      Radial pulses are 2+ on the right side, and 2+ on the left side.       Dorsalis pedis pulses are 2+ on the right side, and  2+ on the left side.       Posterior tibial pulses are 2+ on the right side, and 2+ on the left side.  Pulmonary/Chest: Effort normal. No accessory muscle usage. No respiratory distress. She has wheezes. She exhibits tenderness. She exhibits no crepitus and no deformity.  Mild bilateral wheezing present.  Patient tender to palpation of the rib cage.  Abdominal: Soft. Bowel sounds are normal. There is no tenderness. There is no rigidity, no rebound, no guarding and no CVA tenderness.  Musculoskeletal: Normal range of motion.       Right lower leg: Normal.       Left lower leg: Normal.  Feet:  Right Foot:  Protective Sensation: 3 sites tested. 3 sites sensed.  Left Foot:  Protective Sensation: 3 sites tested. 3 sites sensed.  Neurological: She is alert. GCS eye subscore is 4. GCS verbal subscore is 5. GCS motor subscore is 6.  Speech is clear and goal oriented, follows commands Major Cranial nerves without deficit, no facial droop Normal strength in upper and lower extremities bilaterally including dorsiflexion and plantar flexion, strong and equal grip strength Sensation normal to light touch Moves extremities without ataxia, coordination intact Normal gait  Skin: Skin is warm and dry.  Psychiatric: She has a normal  mood and affect. Her behavior is normal.   ED Treatments / Results  Labs (all labs ordered are listed, but only abnormal results are displayed) Labs Reviewed  GROUP A STREP BY PCR    EKG None  Radiology Dg Chest 2 View  Result Date: 06/29/2018 CLINICAL DATA:  Cough for 10 days with wheezing and diffuse body aches. EXAM: CHEST - 2 VIEW COMPARISON:  PA and lateral chest 03/17/2018 and 07/14/2016. FINDINGS: The lungs are clear. Heart size is normal. No pneumothorax or pleural fluid. No acute or focal bony abnormality. IMPRESSION: Negative chest. Electronically Signed   By: Inge Rise M.D.   On: 06/29/2018 12:56    Procedures Procedures (including critical care time)  Medications Ordered in ED Medications  albuterol (PROVENTIL HFA;VENTOLIN HFA) 108 (90 Base) MCG/ACT inhaler 1-2 puff (2 puffs Inhalation Given 06/29/18 1547)  AEROCHAMBER PLUS FLO-VU LARGE MISC 1 each (has no administration in time range)  albuterol (PROVENTIL) (2.5 MG/3ML) 0.083% nebulizer solution 2.5 mg (2.5 mg Nebulization Given 06/29/18 1440)  acetaminophen (TYLENOL) tablet 650 mg (650 mg Oral Given 06/29/18 1421)     Initial Impression / Assessment and Plan / ED Course  I have reviewed the triage vital signs and the nursing notes.  Pertinent labs & imaging results that were available during my care of the patient were reviewed by me and considered in my medical decision making (see chart for details).    47 year old female presenting for 10 days of viral URI-like symptoms with cough.  Patient given single breathing treatment today with resolution of mild wheezing.  Patient states that she is feeling much improved at this time, states that she is breathing at baseline and is requesting discharge.  Patient has been given a new albuterol inhaler with a spacer today.  Patient's CXR is negative for acute infiltrate.  Strep test negative.  Symptoms are likely of viral etiology. Discussed that antibiotics are not  indicated for viral infections. Patient will be discharged with symptomatic treatment. Patient verbalizes understanding and is agreeable with plan. Patient is hemodynamically stable and in no acute distress prior to discharge.  Patient is afebrile, not tachycardic, not hypotensive, not tachypneic, SPO2 100% on room air.  Patient  does not meet SIRS/sepsis criteria.  She is resting comfortably, well-appearing/nontoxic, no acute distress.  At this time there does not appear to be any evidence of an acute emergency medical condition and the patient appears stable for discharge with appropriate outpatient follow up. Diagnosis was discussed with patient who verbalizes understanding of care plan and is agreeable to discharge. I have discussed return precautions with patient who verbalizes understanding of return precautions. Patient strongly encouraged to follow-up with their PCP this week. All questions answered.  Patient's case discussed with Dr. Eulis Foster who agrees with plan to discharge with follow-up.   Note: Portions of this report may have been transcribed using voice recognition software. Every effort was made to ensure accuracy; however, inadvertent computerized transcription errors may still be present. Final Clinical Impressions(s) / ED Diagnoses   Final diagnoses:  Viral URI with cough    ED Discharge Orders    None       Deliah Boston, PA-C 06/29/18 1556    Deliah Boston, [place] 06/29/18 1646    Daleen Bo, MD 06/29/18 [phone]

## 2018-06-29 NOTE — Discharge Instructions (Addendum)
You have been diagnosed today with viral upper respiratory tract infection with cough.  At this time there does not appear to be the presence of an emergent medical condition, however there is always the potential for conditions to change. Please read and follow the below instructions.  Please return to the Emergency Department immediately for any new or worsening symptoms. Please be sure to follow up with your Primary Care Provider this week regarding your visit today; please call their office to schedule an appointment even if you are feeling better for a follow-up visit. You may use the albuterol inhaler provided to you today as needed.  Please return to the emergency department if you feel that you are using this too frequently or feel that the medication is not helping.  Get help right away if: You have very bad or constant: Headache. Ear pain. Pain in your forehead, behind your eyes, and over your cheekbones (sinus pain). Chest pain. You have long-lasting (chronic) lung disease and any of the following: Wheezing. Long-lasting cough. Coughing up blood. A change in your usual mucus. You have a stiff neck. You have changes in your: Vision. Hearing. Thinking. Mood.  Please read the additional information packets attached to your discharge summary.  Do not take your medicine if  develop an itchy rash, swelling in your mouth or lips, or difficulty breathing.

## 2018-06-29 NOTE — ED Notes (Signed)
To Rad 

## 2018-06-29 NOTE — ED Notes (Signed)
Call to resp 

## 2018-06-29 NOTE — ED Notes (Signed)
From Rad 

## 2018-06-29 NOTE — ED Notes (Signed)
PA in to eval

## 2018-06-29 NOTE — ED Notes (Signed)
URI sx for the last 10 days    PT smokes 1/2 PPD  Awaiting eval

## 2018-06-29 NOTE — ED Triage Notes (Signed)
Pt with runny nose and cough, chills at times, body aches starting Monday. Unknown of fever

## 2018-06-29 NOTE — ED Notes (Signed)
resp called.

## 2018-07-10 ENCOUNTER — Other Ambulatory Visit: Payer: Self-pay

## 2018-07-10 ENCOUNTER — Emergency Department (HOSPITAL_COMMUNITY)
Admission: EM | Admit: 2018-07-10 | Discharge: 2018-07-10 | Disposition: A | Discharge status: Home / Self Care | Payer: BLUE CROSS/BLUE SHIELD | Attending: Emergency Medicine | Admitting: Emergency Medicine

## 2018-07-10 ENCOUNTER — Emergency Department (HOSPITAL_COMMUNITY): Payer: BLUE CROSS/BLUE SHIELD

## 2018-07-10 ENCOUNTER — Encounter (HOSPITAL_COMMUNITY): Payer: Self-pay | Admitting: Emergency Medicine

## 2018-07-10 DIAGNOSIS — R079 Chest pain, unspecified: Secondary | ICD-10-CM | POA: Diagnosis present

## 2018-07-10 DIAGNOSIS — Z79899 Other long term (current) drug therapy: Secondary | ICD-10-CM | POA: Insufficient documentation

## 2018-07-10 DIAGNOSIS — J45909 Unspecified asthma, uncomplicated: Secondary | ICD-10-CM | POA: Diagnosis not present

## 2018-07-10 DIAGNOSIS — R072 Precordial pain: Secondary | ICD-10-CM | POA: Insufficient documentation

## 2018-07-10 DIAGNOSIS — F17210 Nicotine dependence, cigarettes, uncomplicated: Secondary | ICD-10-CM | POA: Insufficient documentation

## 2018-07-10 DIAGNOSIS — Z7982 Long term (current) use of aspirin: Secondary | ICD-10-CM | POA: Insufficient documentation

## 2018-07-10 LAB — BASIC METABOLIC PANEL
Anion gap: 9 (ref 5–15)
BUN: 5 mg/dL — ABNORMAL LOW (ref 6–20)
CALCIUM: 9.5 mg/dL (ref 8.9–10.3)
CO2: 21 mmol/L — AB (ref 22–32)
Chloride: 105 mmol/L (ref 98–111)
Creatinine, Ser: 0.6 mg/dL (ref 0.44–1.00)
GFR calc Af Amer: >60 mL/min (ref >60)
GLUCOSE: 100 mg/dL — AB (ref 70–99)
Potassium: 3.5 mmol/L (ref 3.5–5.1)
SODIUM: 135 mmol/L (ref 135–145)

## 2018-07-10 LAB — CBC
HEMATOCRIT: 33.3 % — AB (ref 36.0–46.0)
Hemoglobin: 10.1 g/dL — ABNORMAL LOW (ref 12.0–15.0)
MCH: 26.3 pg (ref 26.0–34.0)
MCHC: 30.3 g/dL (ref 30.0–36.0)
MCV: 86.7 fL (ref 80.0–100.0)
PLATELETS: 411 10*3/uL — AB (ref 150–400)
RBC: 3.84 MIL/uL — ABNORMAL LOW (ref 3.87–5.11)
RDW: 20.2 % — ABNORMAL HIGH (ref 11.5–15.5)
WBC: 5.3 10*3/uL (ref 4.0–10.5)
nRBC: 0 % (ref 0.0–0.2)

## 2018-07-10 LAB — I-STAT BETA HCG BLOOD, ED (MC, WL, AP ONLY): I-stat hCG, quantitative: <5 m[IU]/mL (ref <5)

## 2018-07-10 LAB — TROPONIN I: Troponin I: <0.03 ng/mL (ref <0.03)

## 2018-07-10 LAB — D-DIMER, QUANTITATIVE: D-Dimer, Quant: 0.39 ug/mL-FEU (ref 0.00–0.50)

## 2018-07-10 MED ORDER — ALUM & MAG HYDROXIDE-SIMETH 200-200-20 MG/5ML PO SUSP
30.0000 mL | Freq: Once | ORAL | Status: AC
  Administered 2018-07-10: 30 mL via ORAL
  Filled 2018-07-10: qty 30

## 2018-07-10 MED ORDER — HYDROCODONE-ACETAMINOPHEN 5-325 MG PO TABS
1.0000 | ORAL_TABLET | Freq: Once | ORAL | Status: AC
  Administered 2018-07-10: 1 via ORAL
  Filled 2018-07-10: qty 1

## 2018-07-10 MED ORDER — RANITIDINE HCL 150 MG PO TABS: 150.0000 mg | ORAL_TABLET | Freq: Two times a day (BID) | ORAL | 0 refills | Status: DC

## 2018-07-10 MED ORDER — IBUPROFEN 800 MG PO TABS
800.0000 mg | ORAL_TABLET | Freq: Once | ORAL | Status: AC
  Administered 2018-07-10: 800 mg via ORAL
  Filled 2018-07-10: qty 1

## 2018-07-10 MED ORDER — ONDANSETRON 8 MG PO TBDP
8.0000 mg | ORAL_TABLET | Freq: Once | ORAL | Status: AC
  Administered 2018-07-10: 8 mg via ORAL
  Filled 2018-07-10: qty 1

## 2018-07-10 MED ORDER — ONDANSETRON HCL 4 MG PO TABS: 4.0000 mg | ORAL_TABLET | Freq: Four times a day (QID) | ORAL | 0 refills | Status: DC

## 2018-07-10 MED ORDER — LIDOCAINE VISCOUS HCL 2 % MT SOLN
15.0000 mL | Freq: Once | OROMUCOSAL | Status: AC
  Administered 2018-07-10: 15 mL via ORAL
  Filled 2018-07-10: qty 15

## 2018-07-10 NOTE — Discharge Instructions (Signed)
It is important that you drink small amounts of clear fluids.  Take your medication as directed.  Follow-up with your primary provider or the clinic listed.

## 2018-07-10 NOTE — ED Provider Notes (Signed)
[facility] EMERGENCY DEPARTMENT Provider Note   CSN: [id number] Arrival date & time: 07/10/18  9562     History   Chief Complaint Chief Complaint  Patient presents with  . Chest Pain    HPI Marissa Frank is a 47 y.o. female.  HPI   [name] is a 47 y.o. female who presents to the Emergency Department complaining of sharp, stabbing, substernal chest pain that began 3 days ago.  She describes a constant pain to her chest with intermittent sharp stabbing pains in the center of her chest that radiate to her back lasting several seconds.  She states the pain is worse with movement, deep breathing and causes her difficulty with swallowing foods, liquids, or her own saliva.  Chest pain is also associated with nausea but no vomiting diaphoresis or syncope.  She denies cocaine or IV drug use.  No fever, sore throat, wheezing or shortness of breath.  No history of coronary artery disease.  She was seen here on 06/29/2018 for cough and chest congestion, she states she has been using her albuterol inhaler, but states her chest pain symptoms seem to have worsened since using the inhaler.   Past Medical History:  Diagnosis Date  . Asthma   . Cancer (HCC)    cervical  . Depression   . Hemorrhoids     Patient Active Problem List   Diagnosis Date Noted  . Constipation 04/18/2018  . Rectal bleeding 04/18/2018  . Normocytic anemia 04/18/2018  . Abdominal pain, chronic, epigastric 04/18/2018    Past Surgical History:  Procedure Laterality Date  . BIOPSY  04/24/2018   Procedure: BIOPSY;  Surgeon: Daneil Dolin, MD;  [facility];  Service: Endoscopy;;  . BREAST SURGERY     benign tumor  . CERVICAL CONE BIOPSY    . ESOPHAGOGASTRODUODENOSCOPY (EGD) WITH PROPOFOL N/A 04/24/2018   Procedure: ESOPHAGOGASTRODUODENOSCOPY (EGD) WITH PROPOFOL;  Surgeon: Daneil Dolin, MD;  Location[facility];  Service: Endoscopy;  Laterality: N/A;  2:30pm - LM for pt to  arrive at 12:15  . MALONEY DILATION N/A 04/24/2018   Procedure: Venia Minks DILATION;  Surgeon: Daneil Dolin, MD;  Location: [facility];  Service: Endoscopy;  Laterality: N/A;  . TUBAL LIGATION    . WISDOM TOOTH EXTRACTION       OB History    Gravida  3   Para  2   Term  2   Preterm      AB  1   Living        SAB      TAB  1   Ectopic      Multiple      Live Births               Home Medications    Prior to Admission medications   Medication Sig Start Date End Date Taking? Authorizing Provider  albuterol (PROVENTIL) (2.5 MG/3ML) 0.083% nebulizer solution Take 3 mLs (2.5 mg total) by nebulization every 6 (six) hours as needed for wheezing or shortness of breath. 07/14/16   [name], MD  aspirin 325 MG tablet Take 325 mg by mouth daily.    [provider]  hydrocortisone (ANUSOL-HC) 2.5 % rectal cream Apply rectally 2 times daily 05/29/18   Rolland Porter, MD  linaclotide [facility]) 290 MCG CAPS capsule Take 1 capsule (290 mcg total) by mouth daily before breakfast. 06/03/18   Annitta Needs, NP  pantoprazole (PROTONIX) 40 MG tablet  Take 1 tablet (40 mg total) by mouth 2 (two) times daily before a meal. 30 minutes before breakfast 05/29/18   Rolland Porter, MD    Family History Family History  Problem Relation Age of Onset  . Hypertension Mother   . Colon cancer Neg Hx   . Colon polyps Neg Hx   . Stomach cancer Neg Hx   . Liver disease Neg Hx     Social History Social History   Tobacco Use  . Smoking status: Current Every Day Smoker    Packs/day: 1.00    Types: Cigarettes  . Smokeless tobacco: Never Used  Substance Use Topics  . Alcohol use: Yes    Comment: occ  . Drug use: No     Allergies   Almond oil; Carrot [daucus carota]; Celery oil; Coconut flavor; and Macadamia nut oil   Review of Systems Review of Systems  Constitutional: Negative for appetite change, chills and fever.  HENT: Positive for trouble swallowing. Negative for  congestion and sore throat.   Respiratory: Positive for cough. Negative for chest tightness, shortness of breath and wheezing.   Cardiovascular: Positive for chest pain. Negative for palpitations.  Gastrointestinal: Positive for nausea. Negative for abdominal pain and vomiting.  Genitourinary: Negative for dysuria.  Musculoskeletal: Negative for arthralgias and neck pain.  Skin: Negative for rash.  Neurological: Negative for dizziness, weakness, numbness and headaches.  Hematological: Negative for adenopathy.     Physical Exam Updated Vital Signs BP 116/82 (BP Location: Left Arm)   Pulse 65   Temp 98.2 F (36.8 C) (Oral)   Resp (!) 24   Ht 5\' 1"  (1.549 m)   Wt 44 kg   LMP 06/29/2018   SpO2 100%   BMI 18.33 kg/m   Physical Exam Vitals signs reviewed.  Constitutional:      Appearance: She is not ill-appearing or diaphoretic.     Comments: Uncomfortable appearing  HENT:     Head: Normocephalic.     Right Ear: Tympanic membrane and ear canal normal.     Left Ear: Tympanic membrane and ear canal normal.     Mouth/Throat:     Mouth: Mucous membranes are moist.     Pharynx: Oropharynx is clear.  Neck:     Musculoskeletal: Normal range of motion. No neck rigidity.  Cardiovascular:     Rate and Rhythm: Normal rate and regular rhythm.     Pulses: Normal pulses.  Pulmonary:     Effort: Pulmonary effort is normal. No respiratory distress.     Breath sounds: Normal breath sounds. No wheezing or rales.     Comments: Patient has tenderness to palpation to the substernal area of the chest. Chest:     Chest wall: Tenderness present.  Abdominal:     General: There is no distension.     Palpations: Abdomen is soft. There is no mass.     Tenderness: There is no abdominal tenderness. There is no guarding.  Musculoskeletal: Normal range of motion.        General: No swelling.  Lymphadenopathy:     Cervical: No cervical adenopathy.  Skin:    General: Skin is warm.     Capillary  Refill: Capillary refill takes less than 2 seconds.  Neurological:     General: No focal deficit present.     Mental Status: She is alert.     Sensory: No sensory deficit.      ED Treatments / Results  Labs (all labs ordered are listed,  but only abnormal results are displayed) Labs Reviewed  CBC - Abnormal; Notable for the following components:      Result Value   RBC 3.84 (*)    Hemoglobin 10.1 (*)    HCT 33.3 (*)    RDW 20.2 (*)    Platelets 411 (*)    All other components within normal limits  BASIC METABOLIC PANEL - Abnormal; Notable for the following components:   CO2 21 (*)    Glucose, Bld 100 (*)    BUN 5 (*)    All other components within normal limits  D-DIMER, QUANTITATIVE (NOT AT [facility])  TROPONIN I  I-STAT BETA HCG BLOOD, ED ([facility], [facility], [facility] ONLY)    EKG EKG Interpretation  Date/Time:  Thursday July 10 2018 09:47:48 EST Ventricular Rate:  60 PR Interval:    QRS Duration: 81 QT Interval:  396 QTC Calculation: 396 R Axis:   7 Text Interpretation:  Sinus rhythm No significant change since last tracing Confirmed by Lajean Saver [phone]) on 07/11/2018 2:17:23 PM   Radiology Dg Chest 2 View  Result Date: 07/10/2018 CLINICAL DATA:  Chest pain EXAM: CHEST - 2 VIEW COMPARISON:  06/29/2018 FINDINGS: Normal heart size. Lungs clear. No pneumothorax. No pleural effusion. Nipple shadow at the left lung base is stable compared with prior radiographs. IMPRESSION: No active cardiopulmonary disease. Electronically Signed   By: Marybelle Killings M.D.   On: 07/10/2018 10:33    Procedures Procedures (including critical care time)  Medications Ordered in ED Medications  HYDROcodone-acetaminophen (NORCO/VICODIN) 5-325 MG per tablet 1 tablet (1 tablet Oral Given 07/10/18 1052)  ibuprofen (ADVIL,MOTRIN) tablet 800 mg (800 mg Oral Given 07/10/18 1052)  ondansetron (ZOFRAN-ODT) disintegrating tablet 8 mg (8 mg Oral Given 07/10/18 1052)  alum & mag hydroxide-simeth (MAALOX/MYLANTA)  200-200-20 MG/5ML suspension 30 mL (30 mLs Oral Given 07/10/18 1222)    And  lidocaine (XYLOCAINE) 2 % viscous mouth solution 15 mL (15 mLs Oral Given 07/10/18 1222)     Initial Impression / Assessment and Plan / ED Course  I have reviewed the triage vital signs and the nursing notes.  Pertinent labs & imaging results that were available during my care of the patient were reviewed by me and considered in my medical decision making (see chart for details).     Patient uncomfortable appearing but nontoxic.  Vitals reassuring.  No tachycardia or hypoxia.  Of note, review of past medical records, patient was seen here in September for similar substernal chest pain.  Heart score low risk for ACS, I feel that symptoms are most likely GI in origin.  On recheck, symptoms are much improved after Maalox and viscous lidocaine.  Work-up reassuring.  She states she is ready for discharge home and agrees to close outpatient follow-up.  Return precautions discussed.  Final Clinical Impressions(s) / ED Diagnoses   Final diagnoses:  Precordial chest pain    ED Discharge Orders    None       Bufford Lope 07/12/18 1914    Milton Ferguson, MD 07/14/18 1005

## 2018-07-10 NOTE — ED Triage Notes (Signed)
Pt c/o mid, non-radiating CP with nausea since Monday. Pt states that it worsens with movement.

## 2018-07-10 NOTE — ED Notes (Signed)
Attempted IV access x 2. Able to get blood work. Will have another RN to attempt.

## 2018-08-04 ENCOUNTER — Emergency Department (HOSPITAL_COMMUNITY)
Admission: EM | Admit: 2018-08-04 | Discharge: 2018-08-04 | Disposition: A | Discharge status: Home / Self Care | Payer: BLUE CROSS/BLUE SHIELD | Attending: Emergency Medicine | Admitting: Emergency Medicine

## 2018-08-04 ENCOUNTER — Encounter (HOSPITAL_COMMUNITY): Payer: Self-pay | Admitting: Emergency Medicine

## 2018-08-04 ENCOUNTER — Emergency Department (HOSPITAL_COMMUNITY): Payer: BLUE CROSS/BLUE SHIELD

## 2018-08-04 ENCOUNTER — Other Ambulatory Visit: Payer: Self-pay

## 2018-08-04 DIAGNOSIS — F17210 Nicotine dependence, cigarettes, uncomplicated: Secondary | ICD-10-CM | POA: Insufficient documentation

## 2018-08-04 DIAGNOSIS — Z7982 Long term (current) use of aspirin: Secondary | ICD-10-CM | POA: Diagnosis not present

## 2018-08-04 DIAGNOSIS — R202 Paresthesia of skin: Secondary | ICD-10-CM | POA: Diagnosis not present

## 2018-08-04 DIAGNOSIS — J45909 Unspecified asthma, uncomplicated: Secondary | ICD-10-CM | POA: Insufficient documentation

## 2018-08-04 DIAGNOSIS — Z79899 Other long term (current) drug therapy: Secondary | ICD-10-CM | POA: Diagnosis not present

## 2018-08-04 DIAGNOSIS — R200 Anesthesia of skin: Secondary | ICD-10-CM | POA: Diagnosis present

## 2018-08-04 LAB — CBC WITH DIFFERENTIAL/PLATELET
Abs Immature Granulocytes: 0.01 10*3/uL (ref 0.00–0.07)
Basophils Absolute: 0 10*3/uL (ref 0.0–0.1)
Basophils Relative: 1 %
Eosinophils Absolute: 0.2 10*3/uL (ref 0.0–0.5)
Eosinophils Relative: 4 %
HCT: 32.6 % — ABNORMAL LOW (ref 36.0–46.0)
Hemoglobin: 10.2 g/dL — ABNORMAL LOW (ref 12.0–15.0)
IMMATURE GRANULOCYTES: 0 %
LYMPHS PCT: 25 %
Lymphs Abs: 1.1 10*3/uL (ref 0.7–4.0)
MCH: 26.4 pg (ref 26.0–34.0)
MCHC: 31.3 g/dL (ref 30.0–36.0)
MCV: 84.2 fL (ref 80.0–100.0)
Monocytes Absolute: 0.8 10*3/uL (ref 0.1–1.0)
Monocytes Relative: 19 %
Neutro Abs: 2.2 10*3/uL (ref 1.7–7.7)
Neutrophils Relative %: 51 %
Platelets: 395 10*3/uL (ref 150–400)
RBC: 3.87 MIL/uL (ref 3.87–5.11)
RDW: 19.6 % — ABNORMAL HIGH (ref 11.5–15.5)
WBC: 4.3 10*3/uL (ref 4.0–10.5)
nRBC: 0 % (ref 0.0–0.2)

## 2018-08-04 LAB — COMPREHENSIVE METABOLIC PANEL
ALK PHOS: 75 U/L (ref 38–126)
ALT: 16 U/L (ref 0–44)
AST: 28 U/L (ref 15–41)
Albumin: 3.4 g/dL — ABNORMAL LOW (ref 3.5–5.0)
Anion gap: 10 (ref 5–15)
BUN: 10 mg/dL (ref 6–20)
CO2: 23 mmol/L (ref 22–32)
Calcium: 9.3 mg/dL (ref 8.9–10.3)
Chloride: 106 mmol/L (ref 98–111)
Creatinine, Ser: 0.61 mg/dL (ref 0.44–1.00)
GFR calc Af Amer: >60 mL/min (ref >60)
GFR calc non Af Amer: >60 mL/min (ref >60)
Glucose, Bld: 89 mg/dL (ref 70–99)
Potassium: 3.4 mmol/L — ABNORMAL LOW (ref 3.5–5.1)
Sodium: 139 mmol/L (ref 135–145)
Total Bilirubin: 0.3 mg/dL (ref 0.3–1.2)
Total Protein: 7 g/dL (ref 6.5–8.1)

## 2018-08-04 LAB — I-STAT BETA HCG BLOOD, ED (MC, WL, AP ONLY): I-stat hCG, quantitative: <5 m[IU]/mL (ref <5)

## 2018-08-04 LAB — ETHANOL: Alcohol, Ethyl (B): 62 mg/dL — ABNORMAL HIGH (ref <10)

## 2018-08-04 MED ORDER — NAPROXEN 500 MG PO TABS: 500.0000 mg | ORAL_TABLET | Freq: Two times a day (BID) | ORAL | 0 refills | Status: DC

## 2018-08-04 NOTE — ED Provider Notes (Signed)
[facility] EMERGENCY DEPARTMENT Provider Note   CSN: [id number] Arrival date & time: 08/04/18  3976     History   Chief Complaint Chief Complaint  Patient presents with  . Numbness    HPI [name] is a 48 y.o. female.  Patient complains of numbness to the right occipital area and weakness in her right eyelid.  The history is provided by the patient. No language interpreter was used.  Illness  This is a new problem. The current episode started more than 2 days ago. The problem occurs constantly. The problem has not changed since onset.Pertinent negatives include no chest pain, no abdominal pain and no headaches. Nothing aggravates the symptoms. Nothing relieves the symptoms. She has tried nothing for the symptoms. The treatment provided no relief.    Past Medical History:  Diagnosis Date  . Asthma   . Cancer (HCC)    cervical  . Depression   . Hemorrhoids     Patient Active Problem List   Diagnosis Date Noted  . Constipation 04/18/2018  . Rectal bleeding 04/18/2018  . Normocytic anemia 04/18/2018  . Abdominal pain, chronic, epigastric 04/18/2018    Past Surgical History:  Procedure Laterality Date  . BIOPSY  04/24/2018   Procedure: BIOPSY;  Surgeon: Daneil Dolin, MD;  Location: [facility];  Service: Endoscopy;;  . BREAST SURGERY     benign tumor  . CERVICAL CONE BIOPSY    . ESOPHAGOGASTRODUODENOSCOPY (EGD) WITH PROPOFOL N/A 04/24/2018   Procedure: ESOPHAGOGASTRODUODENOSCOPY (EGD) WITH PROPOFOL;  Surgeon: Daneil Dolin, MD;  Location: [facility];  Service: Endoscopy;  Laterality: N/A;  2:30pm - LM for pt to arrive at 12:15  . MALONEY DILATION N/A 04/24/2018   Procedure: Venia Minks DILATION;  Surgeon: Daneil Dolin, MD;  Location: [facility];  Service: Endoscopy;  Laterality: N/A;  . TUBAL LIGATION    . WISDOM TOOTH EXTRACTION       OB History    Gravida  3   Para  2   Term  2   Preterm      AB  1   Living        SAB      TAB  1   Ectopic      Multiple      Live Births               Home Medications    Prior to Admission medications   Medication Sig Start Date End Date Taking? Authorizing Provider  albuterol (PROVENTIL HFA;VENTOLIN HFA) 108 (90 Base) MCG/ACT inhaler Inhale 2 puffs into the lungs every 6 (six) hours as needed for wheezing or shortness of breath.   Yes [provider]  albuterol (PROVENTIL) (2.5 MG/3ML) 0.083% nebulizer solution Take 3 mLs (2.5 mg total) by nebulization every 6 (six) hours as needed for wheezing or shortness of breath. 07/14/16  Yes [name], MD  aspirin 325 MG tablet Take 650 mg by mouth daily.    Yes [provider]  hydrOXYzine (VISTARIL) 25 MG capsule Take 25 mg by mouth daily.  05/30/18  Yes [provider]  linaclotide Rolan Lipa) 290 MCG CAPS capsule Take 1 capsule (290 mcg total) by mouth daily before breakfast. 06/03/18  Yes Annitta Needs, NP  ondansetron (ZOFRAN) 4 MG tablet Take 1 tablet (4 mg total) by mouth every 6 (six) hours. 07/10/18  Yes Triplett, Tammy, PA-C  permethrin (ELIMITE) 5 % cream Apply 1 application topically once.  06/25/18  Yes [provider]  ranitidine (ZANTAC) 150 MG tablet Take 1 tablet (150 mg total) by mouth 2 (two) times daily. 07/10/18  Yes Triplett, Tammy, PA-C  triamcinolone cream (KENALOG) 0.1 % Apply 1 application topically 4 (four) times daily.  05/30/18  Yes [provider]  naproxen (NAPROSYN) 500 MG tablet Take 1 tablet (500 mg total) by mouth 2 (two) times daily. 08/04/18   Milton Ferguson, MD    Family History Family History  Problem Relation Age of Onset  . Hypertension Mother   . Colon cancer Neg Hx   . Colon polyps Neg Hx   . Stomach cancer Neg Hx   . Liver disease Neg Hx     Social History Social History   Tobacco Use  . Smoking status: Current Every Day Smoker    Packs/day: 1.00    Types: Cigarettes  . Smokeless tobacco: Never Used  Substance Use Topics  .  Alcohol use: Yes    Comment: occ  . Drug use: No     Allergies   Almond oil; Carrot [daucus carota]; Celery oil; Coconut flavor; and Macadamia nut oil   Review of Systems Review of Systems  Constitutional: Negative for appetite change and fatigue.  HENT: Negative for congestion, ear discharge and sinus pressure.   Eyes: Negative for discharge.  Respiratory: Negative for cough.   Cardiovascular: Negative for chest pain.  Gastrointestinal: Negative for abdominal pain and diarrhea.  Genitourinary: Negative for frequency and hematuria.  Musculoskeletal: Negative for back pain.  Skin: Negative for rash.  Neurological: Negative for seizures and headaches.       Numbness in her head  Psychiatric/Behavioral: Negative for hallucinations.     Physical Exam Updated Vital Signs BP 122/75   Pulse 78   Temp 98.3 F (36.8 C) (Oral)   Resp 18   Ht 5\' 1"  (1.549 m)   Wt 43.1 kg   SpO2 98%   BMI 17.95 kg/m   Physical Exam Vitals signs reviewed.  Constitutional:      Appearance: She is well-developed.  HENT:     Head: Normocephalic.     Nose: Nose normal.  Eyes:     General: No scleral icterus.    Conjunctiva/sclera: Conjunctivae normal.  Neck:     Musculoskeletal: Neck supple.     Thyroid: No thyromegaly.  Cardiovascular:     Rate and Rhythm: Normal rate and regular rhythm.     Heart sounds: No murmur. No friction rub. No gallop.   Pulmonary:     Breath sounds: No stridor. No wheezing or rales.  Chest:     Chest wall: No tenderness.  Abdominal:     General: There is no distension.     Tenderness: There is no abdominal tenderness. There is no rebound.  Musculoskeletal: Normal range of motion.  Lymphadenopathy:     Cervical: No cervical adenopathy.  Skin:    Findings: No erythema or rash.  Neurological:     Mental Status: She is oriented to person, place, and time.     Motor: No abnormal muscle tone.     Coordination: Coordination normal.     Comments: Some  decreased sensation to the right occipital area.  Patient also seems to have mild weakness to her right eyelid  Psychiatric:        Behavior: Behavior normal.      ED Treatments / Results  Labs (all labs ordered are listed, but only abnormal results are displayed) Labs Reviewed  CBC WITH DIFFERENTIAL/PLATELET -  Abnormal; Notable for the following components:      Result Value   Hemoglobin 10.2 (*)    HCT 32.6 (*)    RDW 19.6 (*)    All other components within normal limits  COMPREHENSIVE METABOLIC PANEL - Abnormal; Notable for the following components:   Potassium 3.4 (*)    Albumin 3.4 (*)    All other components within normal limits  ETHANOL - Abnormal; Notable for the following components:   Alcohol, Ethyl (B) 62 (*)    All other components within normal limits  I-STAT BETA HCG BLOOD, ED ([facility], [facility], [facility] ONLY)    EKG None  Radiology Ct Head Wo Contrast  Result Date: 08/04/2018 CLINICAL DATA:  Right-sided facial numbness since yesterday morning. EXAM: CT HEAD WITHOUT CONTRAST TECHNIQUE: Contiguous axial images were obtained from the base of the skull through the vertex without intravenous contrast. COMPARISON:  CT head dated October 30, 2005. FINDINGS: Brain: No evidence of acute infarction, hemorrhage, hydrocephalus, extra-axial collection or mass lesion/mass effect. Mild atrophy, most prominent in the frontal lobes, similar to prior study, but greater than expected for patient's age. Vascular: No hyperdense vessel or unexpected calcification. Skull: Normal. Negative for fracture or focal lesion. Sinuses/Orbits: No acute finding. Other: None. IMPRESSION: 1.  No acute intracranial abnormality. Electronically Signed   By: Titus Dubin M.D.   On: 08/04/2018 09:02   Mr Brain Wo Contrast  Result Date: 08/04/2018 CLINICAL DATA:  Right facial numbness that started yesterday morning. EXAM: MRI HEAD WITHOUT CONTRAST TECHNIQUE: Multiplanar, multiecho pulse sequences of the brain and  surrounding structures were obtained without intravenous contrast. COMPARISON:  Head CT from earlier today FINDINGS: Brain: No acute infarction, hemorrhage, hydrocephalus, extra-axial collection or mass lesion. No white matter disease. The brainstem, cisterns, and Meckel's caves are unremarkable. Vascular: Normal flow voids. Skull and upper cervical spine: Normal marrow signal. Sinuses/Orbits: Few opacified right mastoid air cells. No destructive changes on prior head CT. Other: Motion degraded, overall mild. IMPRESSION: Normal MRI of the brain. Electronically Signed   By: Monte Fantasia M.D.   On: 08/04/2018 10:31    Procedures Procedures (including critical care time)  Medications Ordered in ED Medications - No data to display   Initial Impression / Assessment and Plan / ED Course  I have reviewed the triage vital signs and the nursing notes.  Pertinent labs & imaging results that were available during my care of the patient were reviewed by me and considered in my medical decision making (see chart for details).     Labs are remarkable only for alcohol use.  CT and MRI unremarkable.  Suspect numbness is more peripheral nerve related and may be related to alcohol abuse.  Patient placed on Naprosyn and will follow-up with neurology  Final Clinical Impressions(s) / ED Diagnoses   Final diagnoses:  Paresthesia    ED Discharge Orders         Ordered    naproxen (NAPROSYN) 500 MG tablet  2 times daily     08/04/18 1147           Milton Ferguson, MD 08/04/18 1153

## 2018-08-04 NOTE — Discharge Instructions (Addendum)
Follow-up with Dr. Merlene Laughter in 2 to 3 weeks if symptoms do not improve.  Try to stop drinking alcohol

## 2018-08-04 NOTE — ED Triage Notes (Signed)
Pt C/O right sided facial numbness that started yesterday morning at 0800. Pt states she has been drooling throughout the day yesterday.

## 2019-02-16 ENCOUNTER — Other Ambulatory Visit: Payer: BLUE CROSS/BLUE SHIELD

## 2019-02-16 DIAGNOSIS — Z20822 Contact with and (suspected) exposure to covid-19: Secondary | ICD-10-CM

## 2019-02-18 LAB — NOVEL CORONAVIRUS, NAA: SARS-CoV-2, NAA: NOT DETECTED

## 2019-09-14 ENCOUNTER — Other Ambulatory Visit: Payer: Self-pay

## 2019-09-14 ENCOUNTER — Emergency Department (HOSPITAL_COMMUNITY)
Admission: EM | Admit: 2019-09-14 | Discharge: 2019-09-14 | Disposition: A | Discharge status: Home / Self Care | Payer: BC Managed Care – PPO | Attending: Emergency Medicine | Admitting: Emergency Medicine

## 2019-09-14 ENCOUNTER — Encounter (HOSPITAL_COMMUNITY): Payer: Self-pay | Admitting: *Deleted

## 2019-09-14 ENCOUNTER — Emergency Department (HOSPITAL_COMMUNITY): Payer: BC Managed Care – PPO

## 2019-09-14 DIAGNOSIS — H5371 Glare sensitivity: Secondary | ICD-10-CM | POA: Diagnosis not present

## 2019-09-14 DIAGNOSIS — J45909 Unspecified asthma, uncomplicated: Secondary | ICD-10-CM | POA: Diagnosis not present

## 2019-09-14 DIAGNOSIS — F17210 Nicotine dependence, cigarettes, uncomplicated: Secondary | ICD-10-CM | POA: Insufficient documentation

## 2019-09-14 DIAGNOSIS — R519 Headache, unspecified: Secondary | ICD-10-CM | POA: Insufficient documentation

## 2019-09-14 DIAGNOSIS — H53141 Visual discomfort, right eye: Secondary | ICD-10-CM | POA: Insufficient documentation

## 2019-09-14 LAB — CBC WITH DIFFERENTIAL/PLATELET
Abs Immature Granulocytes: 0 10*3/uL (ref 0.00–0.07)
Basophils Absolute: 0.1 10*3/uL (ref 0.0–0.1)
Basophils Relative: 2 %
Eosinophils Absolute: 0 10*3/uL (ref 0.0–0.5)
Eosinophils Relative: 1 %
HCT: 40.7 % (ref 36.0–46.0)
Hemoglobin: 13.4 g/dL (ref 12.0–15.0)
Immature Granulocytes: 0 %
Lymphocytes Relative: 23 %
Lymphs Abs: 0.7 10*3/uL (ref 0.7–4.0)
MCH: 31.7 pg (ref 26.0–34.0)
MCHC: 32.9 g/dL (ref 30.0–36.0)
MCV: 96.2 fL (ref 80.0–100.0)
Monocytes Absolute: 0.6 10*3/uL (ref 0.1–1.0)
Monocytes Relative: 19 %
Neutro Abs: 1.7 10*3/uL (ref 1.7–7.7)
Neutrophils Relative %: 55 %
Platelets: 348 10*3/uL (ref 150–400)
RBC: 4.23 MIL/uL (ref 3.87–5.11)
RDW: 18.1 % — ABNORMAL HIGH (ref 11.5–15.5)
WBC: 3.2 10*3/uL — ABNORMAL LOW (ref 4.0–10.5)
nRBC: 0 % (ref 0.0–0.2)

## 2019-09-14 LAB — BASIC METABOLIC PANEL
Anion gap: 12 (ref 5–15)
BUN: 8 mg/dL (ref 6–20)
CO2: 24 mmol/L (ref 22–32)
Calcium: 9.6 mg/dL (ref 8.9–10.3)
Chloride: 104 mmol/L (ref 98–111)
Creatinine, Ser: 0.67 mg/dL (ref 0.44–1.00)
GFR calc Af Amer: >60 mL/min (ref >60)
GFR calc non Af Amer: >60 mL/min (ref >60)
Glucose, Bld: 90 mg/dL (ref 70–99)
Potassium: 4 mmol/L (ref 3.5–5.1)
Sodium: 140 mmol/L (ref 135–145)

## 2019-09-14 LAB — SEDIMENTATION RATE: Sed Rate: 5 mm/hr (ref 0–22)

## 2019-09-14 MED ORDER — SODIUM CHLORIDE 0.9 % IV BOLUS
1000.0000 mL | Freq: Once | INTRAVENOUS | Status: AC
  Administered 2019-09-14: 1000 mL via INTRAVENOUS

## 2019-09-14 MED ORDER — DEXAMETHASONE SODIUM PHOSPHATE 10 MG/ML IJ SOLN
10.0000 mg | Freq: Once | INTRAMUSCULAR | Status: AC
  Administered 2019-09-14: 19:00:00 10 mg via INTRAVENOUS
  Filled 2019-09-14: qty 1

## 2019-09-14 MED ORDER — TETRACAINE HCL 0.5 % OP SOLN
1.0000 [drp] | Freq: Once | OPHTHALMIC | Status: AC
  Administered 2019-09-14: 19:00:00 1 [drp] via OPHTHALMIC
  Filled 2019-09-14: qty 4

## 2019-09-14 MED ORDER — FLUORESCEIN SODIUM 1 MG OP STRP
1.0000 | ORAL_STRIP | Freq: Once | OPHTHALMIC | Status: AC
  Administered 2019-09-14: 19:00:00 1 via OPHTHALMIC
  Filled 2019-09-14: qty 1

## 2019-09-14 MED ORDER — PROCHLORPERAZINE EDISYLATE 10 MG/2ML IJ SOLN
10.0000 mg | Freq: Once | INTRAMUSCULAR | Status: AC
  Administered 2019-09-14: 19:00:00 10 mg via INTRAVENOUS
  Filled 2019-09-14: qty 2

## 2019-09-14 MED ORDER — DIPHENHYDRAMINE HCL 50 MG/ML IJ SOLN
25.0000 mg | Freq: Once | INTRAMUSCULAR | Status: AC
  Administered 2019-09-14: 25 mg via INTRAVENOUS
  Filled 2019-09-14: qty 1

## 2019-09-14 NOTE — ED Provider Notes (Signed)
Medical screening examination/treatment/procedure(s) were conducted as a shared visit with non-physician practitioner(s) and myself.  I personally evaluated the patient during the encounter.     Patient presents with severe migraine.  Physical exam perrla,  Collene Leyden, MD 09/14/19 1843

## 2019-09-14 NOTE — Discharge Instructions (Addendum)
Your symptoms and your response to the migraine medication treatment here tonight suggests that your symptoms including your right eyelid lag was from a complicated migraine.  However, you would benefit from further evaluation by Dr. Merlene Laughter our local neurologist.  In the interim, return here if you any return or worsening of symptoms.

## 2019-09-14 NOTE — ED Triage Notes (Signed)
C/o headache, blurred vision and inability to keep right eye open for over 2 weeks

## 2019-09-14 NOTE — ED Provider Notes (Signed)
[facility] EMERGENCY DEPARTMENT Provider Note   CSN: [id number] Arrival date & time: 09/14/19  1447     History Chief Complaint  Patient presents with  . Headache  . Blurred Vision    Marissa Frank is a 49 y.o. female with a history of asthma, treated cervical cancer and depression, presenting with worsening right sided headache along with difficulty keeping her right eyelid open secondary to weakness of the lid but also with complaint of light sensitivity when the eye is open. She reports increased blurred vision in the right eye over the past 2 weeks. She does not wear contacts or glasses at baseline, denies any eye injury or foreign body sensation.  Her headache starts in the right temple region and radiates behind the right ear.  She denies n/v, fevers, neck pain, focal weakness or dizziness.  There has been no drainage from her eyes.    The history is provided by the patient.       Past Medical History:  Diagnosis Date  . Asthma   . Cancer (HCC)    cervical  . Depression   . Hemorrhoids     Patient Active Problem List   Diagnosis Date Noted  . Constipation 04/18/2018  . Rectal bleeding 04/18/2018  . Normocytic anemia 04/18/2018  . Abdominal pain, chronic, epigastric 04/18/2018    Past Surgical History:  Procedure Laterality Date  . BIOPSY  04/24/2018   Procedure: BIOPSY;  Surgeon: Daneil Dolin, MD;  Location: [facility];  Service: Endoscopy;;  . BREAST SURGERY     benign tumor  . CERVICAL CONE BIOPSY    . ESOPHAGOGASTRODUODENOSCOPY (EGD) WITH PROPOFOL N/A 04/24/2018   Procedure: ESOPHAGOGASTRODUODENOSCOPY (EGD) WITH PROPOFOL;  Surgeon: Daneil Dolin, MD;  Location: [facility];  Service: Endoscopy;  Laterality: N/A;  2:30pm - LM for pt to arrive at 12:15  . MALONEY DILATION N/A 04/24/2018   Procedure: Venia Minks DILATION;  Surgeon: Daneil Dolin, MD;  Location: [facility];  Service: Endoscopy;  Laterality: N/A;  . TUBAL LIGATION    . WISDOM  TOOTH EXTRACTION       OB History    Gravida  3   Para  2   Term  2   Preterm      AB  1   Living        SAB      TAB  1   Ectopic      Multiple      Live Births              Family History  Problem Relation Age of Onset  . Hypertension Mother   . Colon cancer Neg Hx   . Colon polyps Neg Hx   . Stomach cancer Neg Hx   . Liver disease Neg Hx     Social History   Tobacco Use  . Smoking status: Current Every Day Smoker    Packs/day: 1.00    Types: Cigarettes  . Smokeless tobacco: Never Used  Substance Use Topics  . Alcohol use: Yes    Comment: occ  . Drug use: No    Home Medications Prior to Admission medications   Medication Sig Start Date End Date Taking? Authorizing Provider  Acetaminophen (PAIN RELIEF PO) Take 2 tablets by mouth daily as needed (for pain/headache).   Yes [provider]  aspirin 325 MG tablet Take 325 mg by mouth daily.    Yes [provider]    Allergies  Almond oil, Carrot [daucus carota], Celery oil, Coconut flavor, and Macadamia nut oil  Review of Systems   Review of Systems  Constitutional: Negative for chills.  Eyes: Positive for visual disturbance. Negative for discharge and redness.  Respiratory: Negative for chest tightness and shortness of breath.   Cardiovascular: Negative for chest pain.  Genitourinary: Negative.   Musculoskeletal: Negative for arthralgias and joint swelling.  Skin: Negative.  Negative for rash and wound.  Neurological: Negative for light-headedness.  Psychiatric/Behavioral: Negative.     Physical Exam Updated Vital Signs BP 114/78   Pulse (!) 54   Temp 98.4 F (36.9 C)   Resp 18   Ht 5\' 1"  (1.549 m)   Wt 44.9 kg   LMP 06/29/2018   SpO2 99%   BMI 18.71 kg/m   Physical Exam Vitals and nursing note reviewed.  Constitutional:      Appearance: She is well-developed.     Comments: Uncomfortable appearing  HENT:     Head: Normocephalic and atraumatic.     Jaw:  There is normal jaw occlusion.     Comments: ttp right scalp including temporal, parietal and posterior right ear.  No mastoid tenderness.  Specifically tender across the right temporal artery but with radiation entire right cranium.  Eyes:     General: Lids are normal.        Right eye: No foreign body or discharge.     Extraocular Movements: Extraocular movements intact.     Conjunctiva/sclera:     Right eye: Right conjunctiva is not injected. No chemosis.    Pupils: Pupils are equal, round, and reactive to light.     Right eye: No corneal abrasion or fluorescein uptake.     Slit lamp exam:    Right eye: Photophobia present. No corneal ulcer or foreign body.     Comments: Pt can fully open both eyelids fully, but briefly with the right eye before closing again.  No corneal abrasion, no eye fb.   Bilateral Distance: 20/70 R Distance: 20/70 L Distance: 20/40    Cardiovascular:     Rate and Rhythm: Normal rate.     Heart sounds: Normal heart sounds.  Pulmonary:     Effort: Pulmonary effort is normal.  Abdominal:     Palpations: Abdomen is soft.     Tenderness: There is no abdominal tenderness.  Musculoskeletal:        General: Normal range of motion.     Cervical back: Normal range of motion and neck supple.  Lymphadenopathy:     Cervical: No cervical adenopathy.  Skin:    General: Skin is warm and dry.     Findings: No rash.  Neurological:     Mental Status: She is alert and oriented to person, place, and time.     GCS: GCS eye subscore is 4. GCS verbal subscore is 5. GCS motor subscore is 6.     Sensory: No sensory deficit.     Gait: Gait normal.     Comments: Normal heel-shin, normal rapid alternating movements. Cranial nerves III-XII intact.  No pronator drift.  Psychiatric:        Speech: Speech normal.        Behavior: Behavior normal.        Thought Content: Thought content normal.     ED Results / Procedures / Treatments   Labs (all labs ordered are listed,  but only abnormal results are displayed) Labs Reviewed  CBC WITH DIFFERENTIAL/PLATELET - Abnormal; Notable for  the following components:      Result Value   WBC 3.2 (*)    RDW 18.1 (*)    All other components within normal limits  BASIC METABOLIC PANEL  SEDIMENTATION RATE    EKG None  Radiology CT Head Wo Contrast  Result Date: 09/14/2019 CLINICAL DATA:  Headache, blurred vision, inability to keep right eye open EXAM: CT HEAD WITHOUT CONTRAST TECHNIQUE: Contiguous axial images were obtained from the base of the skull through the vertex without intravenous contrast. COMPARISON:  08/04/2018 FINDINGS: Brain: No evidence of acute infarction, hemorrhage, hydrocephalus, extra-axial collection or mass lesion/mass effect. Incidental note of cavum septum pellucidum et vergae variant of the lateral ventricles. Vascular: No hyperdense vessel or unexpected calcification. Skull: Normal. Negative for fracture or focal lesion. Sinuses/Orbits: No acute finding. Other: None. IMPRESSION: No acute intracranial pathology. No non-contrast CT findings to explain headache or other symptoms. Electronically Signed   By: Eddie Candle M.D.   On: 09/14/2019 18:42    Procedures Procedures (including critical care time)  Medications Ordered in ED Medications  sodium chloride 0.9 % bolus 1,000 mL (0 mLs Intravenous Stopped 09/14/19 2106)  dexamethasone (DECADRON) injection 10 mg (10 mg Intravenous Given 09/14/19 1852)  prochlorperazine (COMPAZINE) injection 10 mg (10 mg Intravenous Given 09/14/19 1856)  diphenhydrAMINE (BENADRYL) injection 25 mg (25 mg Intravenous Given 09/14/19 1855)  fluorescein ophthalmic strip 1 strip (1 strip Right Eye Given by Other 09/14/19 1859)  tetracaine (PONTOCAINE) 0.5 % ophthalmic solution 1 drop (1 drop Right Eye Given by Other 09/14/19 1858)    ED Course  I have reviewed the triage vital signs and the nursing notes.  Pertinent labs & imaging results that were available during my care  of the patient were reviewed by me and considered in my medical decision making (see chart for details).    MDM Rules/Calculators/A&P                      Pt with right sided headache, photophobia and right eyelid "weakness".  She was given IV fluids and migraine cocktail including decadron, compazine and benadryl.  She had complete resolution of headache along with resolution of photophobia, denied any vision complaint and reports right upper eyelid still feels "not right" but has no lid lag.    Labs and imaging reviewed and discussed with pt.  Considered possibility of temporal arteritis, but with normal sed rate, this is less likely.  Review of the chart indicates prior similar presentation last year including temple pain and right eyelid weakness. Exam not c/w Bells palsy. Doubt tia/cva.  Favor complicated migraine as source of sx.  She was seen by Dr. Merlene Laughter last year after last presentation of these sx.  She does not recall his diagnosis or recommendation.  She was referred to him for f/u care.  She was sx free at time of dc.   Final Clinical Impression(s) / ED Diagnoses Final diagnoses:  Acute nonintractable headache, unspecified headache type    Rx / DC Orders ED Discharge Orders    None       Landis Martins 09/15/19 Rupert Stacks, MD 09/16/19 1224

## 2019-09-21 ENCOUNTER — Other Ambulatory Visit: Payer: BC Managed Care – PPO

## 2019-09-23 ENCOUNTER — Other Ambulatory Visit: Payer: Self-pay

## 2019-09-23 ENCOUNTER — Ambulatory Visit: Payer: BC Managed Care – PPO | Attending: Internal Medicine

## 2019-09-23 DIAGNOSIS — Z20822 Contact with and (suspected) exposure to covid-19: Secondary | ICD-10-CM

## 2019-09-24 LAB — NOVEL CORONAVIRUS, NAA: SARS-CoV-2, NAA: NOT DETECTED

## 2020-04-29 ENCOUNTER — Other Ambulatory Visit: Payer: BC Managed Care – PPO

## 2020-06-06 ENCOUNTER — Emergency Department (HOSPITAL_COMMUNITY)
Admission: EM | Admit: 2020-06-06 | Discharge: 2020-06-06 | Disposition: A | Discharge status: Home / Self Care | Payer: Self-pay | Attending: Emergency Medicine | Admitting: Emergency Medicine

## 2020-06-06 ENCOUNTER — Encounter (HOSPITAL_COMMUNITY): Payer: Self-pay | Admitting: *Deleted

## 2020-06-06 ENCOUNTER — Other Ambulatory Visit: Payer: Self-pay

## 2020-06-06 DIAGNOSIS — R531 Weakness: Secondary | ICD-10-CM | POA: Insufficient documentation

## 2020-06-06 DIAGNOSIS — Z5321 Procedure and treatment not carried out due to patient leaving prior to being seen by health care provider: Secondary | ICD-10-CM | POA: Insufficient documentation

## 2020-06-06 DIAGNOSIS — K649 Unspecified hemorrhoids: Secondary | ICD-10-CM | POA: Insufficient documentation

## 2020-06-06 NOTE — ED Triage Notes (Signed)
Pt with bleeding from hemorrhoids for past 2 days.  Pt c/o weakness.

## 2020-07-28 ENCOUNTER — Other Ambulatory Visit: Payer: Self-pay

## 2020-08-26 ENCOUNTER — Emergency Department (HOSPITAL_COMMUNITY): Admission: EM | Admit: 2020-08-26 | Discharge: 2020-08-26 | Discharge status: Against Medical Advice | Payer: Self-pay

## 2020-08-26 ENCOUNTER — Other Ambulatory Visit: Payer: Self-pay

## 2020-09-03 ENCOUNTER — Emergency Department (HOSPITAL_COMMUNITY)
Admission: EM | Admit: 2020-09-03 | Discharge: 2020-09-03 | Disposition: A | Discharge status: Home / Self Care | Payer: No Typology Code available for payment source | Attending: Emergency Medicine | Admitting: Emergency Medicine

## 2020-09-03 ENCOUNTER — Encounter (HOSPITAL_COMMUNITY): Payer: Self-pay

## 2020-09-03 ENCOUNTER — Other Ambulatory Visit: Payer: Self-pay

## 2020-09-03 DIAGNOSIS — Z8541 Personal history of malignant neoplasm of cervix uteri: Secondary | ICD-10-CM | POA: Insufficient documentation

## 2020-09-03 DIAGNOSIS — J45909 Unspecified asthma, uncomplicated: Secondary | ICD-10-CM | POA: Diagnosis not present

## 2020-09-03 DIAGNOSIS — M533 Sacrococcygeal disorders, not elsewhere classified: Secondary | ICD-10-CM | POA: Insufficient documentation

## 2020-09-03 DIAGNOSIS — F17210 Nicotine dependence, cigarettes, uncomplicated: Secondary | ICD-10-CM | POA: Insufficient documentation

## 2020-09-03 DIAGNOSIS — Z7982 Long term (current) use of aspirin: Secondary | ICD-10-CM | POA: Insufficient documentation

## 2020-09-03 MED ORDER — METHOCARBAMOL 500 MG PO TABS: 500.0000 mg | ORAL_TABLET | Freq: Two times a day (BID) | ORAL | 0 refills | Status: DC

## 2020-09-03 NOTE — ED Provider Notes (Signed)
[facility] EMERGENCY DEPARTMENT Provider Note   CSN: [id number] Arrival date & time: 09/03/20  0820     History Chief Complaint  Patient presents with  . Back Pain    Pt reports that   she fell on her buttocks on the 08/21/20. Reports she was seen at urgent care on the 28th and then followed up on Thursday. Pts pain is in her tailbone. States she cant sleep or sit due to pain Pt was given steroid pac, but did not get it filled    Marissa Frank is a 50 y.o. female who presents to the ED today with complaint of gradual onset, constant, worsening, achy, pain to her coccyx s/p mechanical fall that occurred on 1/23.  Patient reports she was at work when she fell and landed directly onto her buttocks.  She states that she came to this ED on 1/28 for further evaluation of her pain however left prior to being seen due to her work wanting her to be seen at urgent care instead.  She went to a local urgent care and had an x-ray done without any signs of fracture.  She states she was prescribed a steroid pack but did not get it filled.  Patient states she takes prednisone for her asthma does not think it will help with her back pain.  She does states she has been taking ibuprofen PM try to help her sleep as her pain is worse when she sits or lays directly onto her back.  She states she has been getting only about 30 minutes of sleep at a time and is making her feel like she is about to "lose it."  She states she feels like she has a knot in her left lower back and feels like her muscles are tightening up and spasming.  No other complaints at this time.  Patient denies any saddle anesthesia, urinary retention, urinary or bowel incontinence, fevers, chills, weakness/numbness/tingling down lower extremities, any other associated symptoms.  The history is provided by the patient and medical records.       Past Medical History:  Diagnosis Date  . Asthma   . Cancer (HCC)    cervical  . Depression    . Hemorrhoids     Patient Active Problem List   Diagnosis Date Noted  . Constipation 04/18/2018  . Rectal bleeding 04/18/2018  . Normocytic anemia 04/18/2018  . Abdominal pain, chronic, epigastric 04/18/2018    Past Surgical History:  Procedure Laterality Date  . BIOPSY  04/24/2018   Procedure: BIOPSY;  Surgeon: Daneil Dolin, MD;  Location: [facility];  Service: Endoscopy;;  . BREAST SURGERY     benign tumor  . CERVICAL CONE BIOPSY    . ESOPHAGOGASTRODUODENOSCOPY (EGD) WITH PROPOFOL N/A 04/24/2018   Procedure: ESOPHAGOGASTRODUODENOSCOPY (EGD) WITH PROPOFOL;  Surgeon: Daneil Dolin, MD;  Location: [facility];  Service: Endoscopy;  Laterality: N/A;  2:30pm - LM for pt to arrive at 12:15  . MALONEY DILATION N/A 04/24/2018   Procedure: Venia Minks DILATION;  Surgeon: Daneil Dolin, MD;  Location: [facility];  Service: Endoscopy;  Laterality: N/A;  . TUBAL LIGATION    . WISDOM TOOTH EXTRACTION       OB History    Gravida  3   Para  2   Term  2   Preterm      AB  1   Living        SAB  IAB  1   Ectopic      Multiple      Live Births              Family History  Problem Relation Age of Onset  . Hypertension Mother   . Colon cancer Neg Hx   . Colon polyps Neg Hx   . Stomach cancer Neg Hx   . Liver disease Neg Hx     Social History   Tobacco Use  . Smoking status: Current Every Day Smoker    Packs/day: 1.00    Types: Cigarettes  . Smokeless tobacco: Never Used  Vaping Use  . Vaping Use: Former  Substance Use Topics  . Alcohol use: Yes    Comment: beer  . Drug use: No    Home Medications Prior to Admission medications   Medication Sig Start Date End Date Taking? Authorizing Provider  methocarbamol (ROBAXIN) 500 MG tablet Take 1 tablet (500 mg total) by mouth 2 (two) times daily. 09/03/20  Yes Darshana Curnutt, PA-C  Acetaminophen (PAIN RELIEF PO) Take 2 tablets by mouth daily as needed (for pain/headache).    [provider]  aspirin 325 MG tablet Take 325 mg by mouth daily.     [provider]    Allergies    Almond oil, Carrot [daucus carota], Celery oil, Coconut flavor, and Macadamia nut oil  Review of Systems   Review of Systems  Constitutional: Negative for chills and fever.  Musculoskeletal: Positive for arthralgias.  Neurological: Negative for weakness and numbness.    Physical Exam Updated Vital Signs BP 101/65   Pulse 61   Temp 98.7 F (37.1 C) (Oral)   Resp 18   Ht 5\' 1"  (1.549 m)   Wt 42.6 kg   LMP 06/29/2018   SpO2 98%   BMI 17.76 kg/m   Physical Exam Vitals and nursing note reviewed.  Constitutional:      Appearance: She is not ill-appearing.  HENT:     Head: Normocephalic and atraumatic.  Eyes:     Conjunctiva/sclera: Conjunctivae normal.  Cardiovascular:     Rate and Rhythm: Normal rate and regular rhythm.     Pulses: Normal pulses.  Pulmonary:     Effort: Pulmonary effort is normal.     Breath sounds: Normal breath sounds. No wheezing, rhonchi or rales.  Abdominal:     Palpations: Abdomen is soft.     Tenderness: There is no abdominal tenderness.  Musculoskeletal:     Cervical back: Neck supple.     Comments: No C, T, or L midline spinal TTP. + TTP to the sacrum/coccyx area with mild tenderness to the left buttocks as well. ROM intact to BLE. Strength and sensation intact. 2+ DP pulses bilaterally.   Skin:    General: Skin is warm and dry.  Neurological:     Mental Status: She is alert.     ED Results / Procedures / Treatments   Labs (all labs ordered are listed, but only abnormal results are displayed) Labs Reviewed - No data to display  EKG None  Radiology No results found.  Procedures Procedures   Medications Ordered in ED Medications - No data to display  ED Course  I have reviewed the triage vital signs and the nursing notes.  Pertinent labs & imaging results that were available during my care of the patient were reviewed by me  and considered in my medical decision making (see chart for details).    MDM Rules/Calculators/A&P  50 year old female presents to the ED today complaining of persistent pain to her coccyx status post mechanical fall on 1/23.  Has been seen in urgent care and had an x-ray done without any acute findings, prescribed a steroid pack however did not get it filled.  Presents to the ED today with persistent pain and inability to sleep.  On arrival to the ED vitals are stable.  Patient is afebrile, nontachycardic and nontachypneic.  She does appear uncomfortable today, laying on her right side as she is worsening pain when she lays directly onto her back.  She is noted to have positive tenderness to her coccyx and sacrum area with some tenderness to her left buttock.  No midline C, T, L spinal tenderness to palpation.  She is neurovascularly intact throughout.  No red flag symptoms today concerning for cauda equina, spinal epidural abscess, AAA.  Have offered a repeat x-ray for patient at this time however she declines.  I do not suspect there to be a fracture today given she already had an x-ray which was negative several days later.  Unfortunately drove herself here and cannot receive narcotic.  I do think she would benefit from some muscle relaxers today, I have also advised that she apply a circular shaped pillow to help alleviate pressure.  We will give patient a few days off work to rest.  She is instructed to follow-up with her PCP for same.  Information given for [place] and wellness for further evaluation for primary care needs.  Patient is in agreement with plan is stable for discharge.   This note was prepared using Dragon voice recognition software and may include unintentional dictation errors due to the inherent limitations of voice recognition software.  Final Clinical Impression(s) / ED Diagnoses Final diagnoses:  Pain in the coccyx    Rx / DC Orders ED Discharge  Orders         Ordered    methocarbamol (ROBAXIN) 500 MG tablet  2 times daily        09/03/20 0918           Discharge Instructions     See attached information on tailbone injuries.   Pick up medication and take as prescribed. DO NOT DRIVE WHILE ON THIS MEDICATION AS IT CAN MAKE YOU DROWSY. I would recommend taking this at nighttime to help you sleep.   You can also fill the prescription given to you at urgent care. Ibuprofen and Tylenol will help with inflammation. It is recommended that you buy a doughnut shaped pillow to help alleviate pressure off of your tailbone. This injury can take several weeks to heal.   Follow up with your PCP regarding your ED visit. If you do not have one you can follow up with [facility] and Wellness for primary care needs.   Return to the ED for any worsening symptoms       Eustaquio Maize, PA-C 09/03/20 [id number]    Fredia Sorrow, MD 09/04/20 [phone]

## 2020-09-03 NOTE — Discharge Instructions (Signed)
See attached information on tailbone injuries.   Pick up medication and take as prescribed. DO NOT DRIVE WHILE ON THIS MEDICATION AS IT CAN MAKE YOU DROWSY. I would recommend taking this at nighttime to help you sleep.   You can also fill the prescription given to you at urgent care. Ibuprofen and Tylenol will help with inflammation. It is recommended that you buy a doughnut shaped pillow to help alleviate pressure off of your tailbone. This injury can take several weeks to heal.   Follow up with your PCP regarding your ED visit. If you do not have one you can follow up with [facility] and Wellness for primary care needs.   Return to the ED for any worsening symptoms

## 2020-09-29 DIAGNOSIS — Z139 Encounter for screening, unspecified: Secondary | ICD-10-CM

## 2020-09-29 LAB — GLUCOSE, POCT (MANUAL RESULT ENTRY): POC Glucose: 95 mg/dl (ref 70–99)

## 2020-09-29 NOTE — Congregational Nurse Program (Signed)
Pt attended [employer] [facility] Connect to get established with a primary care provider  States.     States her chief complaint(s) during the visit is  Small swollen nodule to Left breast that has changed color over time; Left underarm pain that is tender to touch with swelling; Left shoulder blade pain when inhaling; and some back pain/muscle spasms; states she hurt her back at her old job (about 3 weeks ago); hx of sleep apnea, hx of bleedng hemmroids.  States her last mammogram was 2 years ago (2019);  States she has been having problem with her vision, more so at night time. Right side is worse than left side  States hx of asthma, anxiety, bipolar disorder, stroke (10 yrs ago; hx of Right side benign tumor to breast area that has been removed; cervical cancer with removal of abnormal cells; hx of 4 blood transfusions.  hx of sleep apnea,           hx of bleedng hemmorrhoids  Denies taking any meds for any of the above stated medical conditions, except for muscle spasms/back pain (methocarbomol (take daily)  & prednisone (don't take all the time)      Vitals (ALL WNL)   Plan  Enrollment and eligibility was completed on today's visit with Care Connect Program by Johnnette Barrios.   Referral given to RN Nurse Case Manager, Mayra Reel, for initial and continous medical case management upon completion of first medical appointment with the [place] needed to be identified; food (list of mobile food market); housing and utility resources   Referral needed for Vision Screening (Coordinated with Elfredia Nevins, RN & Social Work Intern)  Referral needed  for behavioral health services to manage bipolar disorder (Pt wants to find out more info regarding [facility] before deciding on [place]  ((Coordinated with Elfredia Nevins, RN &  Social Work Intern)  Referral for food mobile fresh markets (Coordinated with Elfredia Nevins, RN & Social Work  Theatre manager)  Referral needed for rent and utility assistance (Pt attended [employer] to get established with a primary care provider  States.     States her chief complaint(s) during the visit is  Small swollen nodule to Left breast that has changed color over time; Left underarm pain that is tender to touch with swelling; Left shoulder blade pain when inhaling; and some back pain/muscle spasms; states she hurt her back at her old job (about 3 weeks ago); hx of sleep apnea, hx of bleedng hemmroids.  States her last mammogram was 2 years ago (2019);  States she has been having problem with her vision, more so at night time. Right side is worse than left side  States hx of asthma, anxiety, bipolar disorder, stroke (10 yrs ago; hx of Right side benign tumor to breast area that has been removed; cervical cancer with removal of abnormal cells; hx of 4 blood transfusions.  hx of sleep apnea,           hx of bleedng hemmorrhoids  Denies taking any meds for any of the above stated medical conditions, except for muscle spasms/back pain (methocarbomol (take daily)  & prednisone (don't take all the time)      Vitals (ALL WNL)   Plan  Enrollment and eligibility was completed on today's visit with Care Connect Program by Johnnette Barrios.   Referral given to RN Nurse Case Manager, Mayra Reel, for initial and  continous medical case management upon completion of first medical appointment with the [place] identified were food (list of mobile food markets needed); housing(rent and utility payment assistance)  Referral needed for Vision Screening (Coordinated with Elfredia Nevins, RN & Social Work Intern)  Referral needed  for behavioral health services to manage bipolar disorder (Pt wants to find out more info regarding [facility] before deciding on [place]  ((Coordinated with Elfredia Nevins, RN &  Social Work Intern)  Referral for food  mobile fresh markets (Coordinated with Elfredia Nevins, RN & Social Work Intern)  [employer] was scheduled for  Beatris Ship)  October 04, 2020 @ 1:15 pm at the [facility].     [place] Appointment was scheduled for  (Tues)  October 04, 2020 @ 1:15 pm at the [facility].

## 2020-10-04 ENCOUNTER — Telehealth: Payer: Self-pay

## 2020-10-04 ENCOUNTER — Ambulatory Visit: Payer: Self-pay | Admitting: Physician Assistant

## 2020-10-04 ENCOUNTER — Other Ambulatory Visit: Payer: Self-pay

## 2020-10-04 ENCOUNTER — Encounter: Payer: Self-pay | Admitting: Physician Assistant

## 2020-10-04 VITALS — BP 116/67 | HR 88 | Temp 97.8°F | Ht 62.0 in | Wt 95.0 lb

## 2020-10-04 DIAGNOSIS — Z13220 Encounter for screening for lipoid disorders: Secondary | ICD-10-CM

## 2020-10-04 DIAGNOSIS — Z7689 Persons encountering health services in other specified circumstances: Secondary | ICD-10-CM

## 2020-10-04 DIAGNOSIS — Z862 Personal history of diseases of the blood and blood-forming organs and certain disorders involving the immune mechanism: Secondary | ICD-10-CM

## 2020-10-04 DIAGNOSIS — Z1239 Encounter for other screening for malignant neoplasm of breast: Secondary | ICD-10-CM

## 2020-10-04 DIAGNOSIS — Z87898 Personal history of other specified conditions: Secondary | ICD-10-CM

## 2020-10-04 DIAGNOSIS — R1310 Dysphagia, unspecified: Secondary | ICD-10-CM

## 2020-10-04 DIAGNOSIS — Z8719 Personal history of other diseases of the digestive system: Secondary | ICD-10-CM

## 2020-10-04 DIAGNOSIS — K649 Unspecified hemorrhoids: Secondary | ICD-10-CM

## 2020-10-04 DIAGNOSIS — F17200 Nicotine dependence, unspecified, uncomplicated: Secondary | ICD-10-CM

## 2020-10-04 MED ORDER — OMEPRAZOLE 40 MG PO CPDR: 40.0000 mg | DELAYED_RELEASE_CAPSULE | Freq: Every day | ORAL | 0 refills | Status: DC

## 2020-10-04 NOTE — Congregational Nurse Program (Signed)
Care Coordination note added regarding [employer] approval until 09/27/2021.   Will plan to follow up with client after [facility] appointment.   Debria Garret RN [place] [employer]

## 2020-10-04 NOTE — Progress Notes (Signed)
BP 116/67   Pulse 88   Temp 97.8 F (36.6 C)   Ht 5\' 2"  (1.575 m)   Wt 95 lb (43.1 kg)   LMP 06/29/2018   SpO2 99%   BMI 17.38 kg/m    Subjective:    Patient ID: Marissa Frank, female    DOB: 11-22-1970, 50 y.o.   MRN: [id number]  HPI: Marissa Frank is a 50 y.o. female presenting on 10/04/2020 for New Patient (Initial Visit)   HPI   Pt had a negative covid 19 screening questionnaire.    Chief Complaint  Patient presents with  . New Patient (Initial Visit)     Pt not working.   She was working at [employer] in february but was only there for two weeks.    She has not had a PCP in a long time.  Pt states pain under arm and shoulder hurting since october when she got covid vaccination shot and also she has a knot left breast.  Knot present for 1 1/2 year.    Pt has hx r breast bx that was benign.  Most recent mammo was 2017.  Review of her covid card shows that she got her immunization in July which was months before she started with the pain under her arm.    Also hx hemorrhoids and they have been bleeding a lot.  She says she always has touble with the swallowing.  She was Seen by [facility] in the past and had EGD which showed gastritis.  Pt missed her follow up with them.    Pt reports that she drinks 1 glass of wine each night.  Review of EPic notes shows that pt has had multiple episodes with significantly elevated alcohol blood levels.        Relevant past medical, surgical, family and social history reviewed and updated as indicated. Interim medical history since our last visit reviewed. Allergies and medications reviewed and updated.   Current Outpatient Medications:  .  Acetaminophen (PAIN RELIEF PO), Take 2 tablets by mouth daily as needed (for pain/headache)., Disp: , Rfl:  .  aspirin 325 MG tablet, Take 325 mg by mouth daily. , Disp: , Rfl:  .  methocarbamol (ROBAXIN) 500 MG tablet, Take 1 tablet (500 mg total) by mouth 2 (two) times daily.,  Disp: 20 tablet, Rfl: 0    Review of Systems  Per HPI unless specifically indicated above     Objective:    BP 116/67   Pulse 88   Temp 97.8 F (36.6 C)   Ht 5\' 2"  (1.575 m)   Wt 95 lb (43.1 kg)   LMP 06/29/2018   SpO2 99%   BMI 17.38 kg/m   Wt Readings from Last 3 Encounters:  10/04/20 95 lb (43.1 kg)  09/29/20 93 lb 6.4 oz (42.4 kg)  09/03/20 94 lb (42.6 kg)    Physical Exam Exam conducted with a chaperone present.  Constitutional:      General: She is not in acute distress.    Appearance: She is not toxic-appearing.  HENT:     Head: Normocephalic and atraumatic.     Right Ear: Tympanic membrane and ear canal normal.     Left Ear: Tympanic membrane and ear canal normal.  Eyes:     Extraocular Movements: Extraocular movements intact.     Conjunctiva/sclera: Conjunctivae normal.     Pupils: Pupils are equal, round, and reactive to light.  Neck:     Thyroid: No thyroid mass,  thyromegaly or thyroid tenderness.  Cardiovascular:     Rate and Rhythm: Normal rate and regular rhythm.     Pulses: Normal pulses.  Pulmonary:     Effort: Pulmonary effort is normal. No respiratory distress.     Breath sounds: Normal breath sounds. No wheezing or rhonchi.  Chest:  Breasts:     Right: Normal. No swelling, bleeding, inverted nipple, mass, nipple discharge, skin change or tenderness.      Comments: There is a small skin lesion left breast at 2 o'clock position which appears to be a small skin cyst.  No redness or purulence, about 40mm diameter. Abdominal:     General: Bowel sounds are normal.     Palpations: Abdomen is soft. There is no mass.     Tenderness: There is no abdominal tenderness.  Genitourinary:    Rectum: External hemorrhoid present.     Comments: Non-thrombosed, mildly tender external hemmorhoids Musculoskeletal:     Cervical back: Neck supple. No tenderness.     Right lower leg: No edema.     Left lower leg: No edema.  Lymphadenopathy:     Cervical: No  cervical adenopathy.  Skin:    General: Skin is warm and dry.  Neurological:     Mental Status: She is alert and oriented to person, place, and time.     Motor: No weakness or tremor.     Gait: Gait is intact. Gait normal.     Deep Tendon Reflexes:     Reflex Scores:      Patellar reflexes are 2+ on the right side and 2+ on the left side. Psychiatric:        Attention and Perception: Attention normal.        Mood and Affect: Affect is not inappropriate.        Speech: Speech normal.        Behavior: Behavior normal. Behavior is cooperative.             Assessment & Plan:     Encounter Diagnoses  Name Primary?  . Encounter to establish care Yes  . Dysphagia, unspecified type   . Hemorrhoids, unspecified hemorrhoid type   . Screening cholesterol level   . History of anemia   . Encounter for screening for malignant neoplasm of breast, unspecified screening modality   . Tobacco use disorder   . History of heavy alcohol consumption   . History of esophagitis         -rx Omeprazole -Refer GI for recheck esophagitis , dysphagia, hemorrhoids -refer for screening Mammogram -Will refer to surgeon if skin cyst for removal (if mammogram normal) -update labs -Pt is given cafa / application for [employer] financial assistance -pt to follow up 1 mo

## 2020-10-04 NOTE — Congregational Nurse Program (Signed)
Client in today for assistance with CAFA from D. Luciana Axe.  She has prescription from [facility] for Omeprazole until she receives hers from [facility] in the mail.  Client will leave prescription for possible assistance. Plan will follow up with client once prescription is sent for a one time assistance only. Client reports understanding.  [place] [employer]

## 2020-10-04 NOTE — Telephone Encounter (Signed)
Client in earlier to [employer] to begin CAFA application with D. Luciana Axe. She has a prescription for omeprazole from her PCP appointment today at [facility]. Faxed prescription and one time assistance voucher to [employer].   Client informed to check with pharmacy to determine when medication will be available to pick up. Client given phone number.  Client reports understanding.  Will plan to follow up further for SDOH needs tomorrow. Client agreeable.  Debria Garret RN [facility]/Care connect

## 2020-10-05 ENCOUNTER — Encounter: Payer: Self-pay | Admitting: Internal Medicine

## 2020-10-24 ENCOUNTER — Ambulatory Visit (HOSPITAL_COMMUNITY): Payer: Self-pay

## 2020-10-24 ENCOUNTER — Telehealth: Payer: Self-pay

## 2020-10-24 NOTE — Telephone Encounter (Signed)
Client returned call. She states she attempted to reschedule with radiology and they told her the referral would need to come from her provider.  [facility] and spoke to clinic CMA and she will ask provider to resend referral  In the morning. Asked clinic CMA to also call client with the rescheduled appointment. She states she will do so.  Called client back to update that [facility] will [facility] another [facility] for her [facility] and will contact her with that appointment. Client states understanding.  Debria Garret RN [place] [employer]

## 2020-10-24 NOTE — Telephone Encounter (Signed)
Had message to call client today, regarding a missed appointment today for a mammogram that was ordered by [facility]. No answer, left message requesting return call.   Debria Garret RN [place] [employer]

## 2020-10-25 ENCOUNTER — Telehealth: Payer: Self-pay

## 2020-10-25 NOTE — Telephone Encounter (Signed)
Called pt to inform of rescheduled mammogram appointment on 11/14/20 @ 4:15 pm. Advised pt to arrive by 4: 00 pm to get registered. Advised pt no deodorant, lotion, powder or perfume to breast or underarm area.

## 2020-11-03 ENCOUNTER — Other Ambulatory Visit: Payer: Self-pay

## 2020-11-03 ENCOUNTER — Other Ambulatory Visit (HOSPITAL_COMMUNITY)
Admission: RE | Admit: 2020-11-03 | Discharge: 2020-11-03 | Disposition: A | Discharge status: Home / Self Care | Payer: Self-pay | Source: Ambulatory Visit | Attending: Physician Assistant | Admitting: Physician Assistant

## 2020-11-03 DIAGNOSIS — Z13220 Encounter for screening for lipoid disorders: Secondary | ICD-10-CM | POA: Insufficient documentation

## 2020-11-03 DIAGNOSIS — Z87898 Personal history of other specified conditions: Secondary | ICD-10-CM | POA: Insufficient documentation

## 2020-11-03 DIAGNOSIS — Z862 Personal history of diseases of the blood and blood-forming organs and certain disorders involving the immune mechanism: Secondary | ICD-10-CM | POA: Insufficient documentation

## 2020-11-03 LAB — CBC
HCT: 42.4 % (ref 36.0–46.0)
Hemoglobin: 14 g/dL (ref 12.0–15.0)
MCH: 34.7 pg — ABNORMAL HIGH (ref 26.0–34.0)
MCHC: 33 g/dL (ref 30.0–36.0)
MCV: 105 fL — ABNORMAL HIGH (ref 80.0–100.0)
Platelets: 292 10*3/uL (ref 150–400)
RBC: 4.04 MIL/uL (ref 3.87–5.11)
RDW: 14 % (ref 11.5–15.5)
WBC: 5 10*3/uL (ref 4.0–10.5)
nRBC: 0 % (ref 0.0–0.2)

## 2020-11-03 LAB — LIPID PANEL
Cholesterol: 242 mg/dL — ABNORMAL HIGH (ref 0–200)
HDL: 108 mg/dL (ref >40)
LDL Cholesterol: 118 mg/dL — ABNORMAL HIGH (ref 0–99)
Total CHOL/HDL Ratio: 2.2 RATIO
Triglycerides: 80 mg/dL (ref <150)
VLDL: 16 mg/dL (ref 0–40)

## 2020-11-03 LAB — COMPREHENSIVE METABOLIC PANEL
ALT: 23 U/L (ref 0–44)
AST: 43 U/L — ABNORMAL HIGH (ref 15–41)
Albumin: 3.9 g/dL (ref 3.5–5.0)
Alkaline Phosphatase: 67 U/L (ref 38–126)
Anion gap: 10 (ref 5–15)
BUN: 8 mg/dL (ref 6–20)
CO2: 25 mmol/L (ref 22–32)
Calcium: 9.5 mg/dL (ref 8.9–10.3)
Chloride: 103 mmol/L (ref 98–111)
Creatinine, Ser: 0.74 mg/dL (ref 0.44–1.00)
GFR, Estimated: >60 mL/min (ref >60)
Glucose, Bld: 92 mg/dL (ref 70–99)
Potassium: 4.1 mmol/L (ref 3.5–5.1)
Sodium: 138 mmol/L (ref 135–145)
Total Bilirubin: 0.6 mg/dL (ref 0.3–1.2)
Total Protein: 7.3 g/dL (ref 6.5–8.1)

## 2020-11-09 ENCOUNTER — Ambulatory Visit: Payer: Self-pay | Admitting: Physician Assistant

## 2020-11-14 ENCOUNTER — Other Ambulatory Visit: Payer: Self-pay

## 2020-11-14 ENCOUNTER — Ambulatory Visit (HOSPITAL_COMMUNITY)
Admission: RE | Admit: 2020-11-14 | Discharge: 2020-11-14 | Disposition: A | Discharge status: Home / Self Care | Payer: Self-pay | Source: Ambulatory Visit | Attending: Physician Assistant | Admitting: Physician Assistant

## 2020-11-14 DIAGNOSIS — Z1239 Encounter for other screening for malignant neoplasm of breast: Secondary | ICD-10-CM | POA: Insufficient documentation

## 2020-11-15 ENCOUNTER — Ambulatory Visit: Payer: Self-pay | Admitting: Physician Assistant

## 2020-11-16 ENCOUNTER — Telehealth: Payer: Self-pay

## 2020-11-16 ENCOUNTER — Other Ambulatory Visit: Payer: Self-pay

## 2020-11-16 ENCOUNTER — Ambulatory Visit (INDEPENDENT_AMBULATORY_CARE_PROVIDER_SITE_OTHER): Payer: Self-pay | Admitting: Gastroenterology

## 2020-11-16 ENCOUNTER — Encounter: Payer: Self-pay | Admitting: Gastroenterology

## 2020-11-16 VITALS — BP 107/67 | HR 53 | Temp 96.9°F | Ht 62.0 in | Wt 98.4 lb

## 2020-11-16 DIAGNOSIS — K5900 Constipation, unspecified: Secondary | ICD-10-CM

## 2020-11-16 DIAGNOSIS — R1310 Dysphagia, unspecified: Secondary | ICD-10-CM

## 2020-11-16 DIAGNOSIS — K625 Hemorrhage of anus and rectum: Secondary | ICD-10-CM

## 2020-11-16 DIAGNOSIS — K279 Peptic ulcer, site unspecified, unspecified as acute or chronic, without hemorrhage or perforation: Secondary | ICD-10-CM | POA: Insufficient documentation

## 2020-11-16 MED ORDER — HYDROCORTISONE (PERIANAL) 2.5 % EX CREA
1.0000 "application " | TOPICAL_CREAM | Freq: Two times a day (BID) | CUTANEOUS | 1 refills | Status: DC
  Filled 2020-11-25: qty 30, 10d supply, fill #0

## 2020-11-16 MED ORDER — NYSTATIN 100000 UNIT/ML MT SUSP
5.0000 mL | Freq: Four times a day (QID) | OROMUCOSAL | 1 refills | Status: DC
  Filled 2020-11-25: qty 473, 24d supply, fill #0

## 2020-11-16 NOTE — Progress Notes (Signed)
Referring Provider: Soyla Dryer, PA-C Primary Care Physician:  Soyla Dryer, PA-C Primary GI: Dr. Gala Romney   Chief Complaint  Patient presents with  . Constipation    Sometimes doesn't have bm for 3 weeks  . Hemorrhoids    Large amount of bleeding at times  . Bloated  . Dysphagia    Causes chest to hurt    HPI:   FIONA COTO is a 50 y.o. female presenting today with a history of PUD in setting of NSAIDs with EGD in Sept 2019 but lost to follow-up, ETOH use, constipation, IDA in the past. Last seen in 2019 and lost to follow-up.   Few months back had acute onset of dysphagia/odynophagia. Intermittent now. Stays bloated feeling. 3 weeks without a BM at times. Did well with Linzess in the past. Intermittent rectal bleeding. Last colonoscopy remote past at [facility] but not in our system. Bloating improved after BM. Taking OTC stool softeners to help with BMs. Last BM 3 days ago. Occasional rectal discomfort.   Taking aspirin 325 mg daily on her own. Taking Prilosec daily. Was on prednisone previously for coccyx injury. Noted white on her tongue.     Past Medical History:  Diagnosis Date  . Asthma   . Cancer (HCC)    cervical  . Depression   . Hemorrhoids     Past Surgical History:  Procedure Laterality Date  . BIOPSY  04/24/2018   Procedure: BIOPSY;  Surgeon: Daneil Dolin, MD;  [facility];  Service: Endoscopy;;  . BREAST SURGERY     benign tumor  . CERVICAL CONE BIOPSY    . ESOPHAGOGASTRODUODENOSCOPY (EGD) WITH PROPOFOL N/A 04/24/2018   reflux esophagitis s/p dilation, small hiatal hernia, multiple small gastric ulcers s/p biopsy, reactive gastropathy, no erosions. +NSAIDs at that time.  Marland Kitchen MALONEY DILATION N/A 04/24/2018   Procedure: Venia Minks DILATION;  Surgeon: Daneil Dolin, MD;  [facility];  Service: Endoscopy;  Laterality: N/A;  . TUBAL LIGATION    . WISDOM TOOTH EXTRACTION      Current Outpatient Medications   Medication Sig Dispense Refill  . Acetaminophen (PAIN RELIEF PO) Take 2 tablets by mouth daily as needed (for pain/headache).    Marland Kitchen aspirin 325 MG tablet Take 325 mg by mouth daily.     . hydrocortisone (ANUSOL-HC) 2.5 % rectal cream Place 1 application rectally 2 (two) times daily. 30 g 1  . methocarbamol (ROBAXIN) 500 MG tablet Take 1 tablet (500 mg total) by mouth 2 (two) times daily. 20 tablet 0  . nystatin (MYCOSTATIN) 100000 UNIT/ML suspension Take 5 mLs (500,000 Units total) by mouth 4 (four) times daily. 473 mL 1  . omeprazole (PRILOSEC) 40 MG capsule Take 1 capsule (40 mg total) by mouth daily. 30 capsule 0   No current facility-administered medications for this visit.    Allergies as of 11/16/2020 - Review Complete 11/16/2020  Allergen Reaction Noted  . Almond oil Itching 03/30/2011  . Carrot [daucus carota] Itching 03/16/2015  . Celery oil Itching 03/16/2015  . Coconut flavor Nausea And Vomiting and Other (See Comments) 03/30/2011  . Macadamia nut oil Itching 03/30/2011    Family History  Problem Relation Age of Onset  . Hypertension Mother   . Stroke Mother   . Colon cancer Neg Hx   . Colon polyps Neg Hx   . Stomach cancer Neg Hx   . Liver disease Neg Hx     Social History  Socioeconomic History  . Marital status: Single    Spouse name: Not on file  . Number of children: Not on file  . Years of education: Not on file  . Highest education level: Not on file  Occupational History  . Not on file  Tobacco Use  . Smoking status: Current Every Day Smoker    Packs/day: 0.50    Types: Cigarettes  . Smokeless tobacco: Never Used  Vaping Use  . Vaping Use: Former  Substance and Sexual Activity  . Alcohol use: Yes    Alcohol/week: 7.0 standard drinks    Types: 7 Glasses of wine per week    Comment: 11/16/20-no drink in 2 weeks; previously drank glass of wine before bedtime  . Drug use: Not Currently    Types: Marijuana    Comment: none since teenager  . Sexual  activity: Yes    Birth control/protection: Surgical, Post-menopausal  Other Topics Concern  . Not on file  Social History Narrative  . Not on file   Social Determinants of Health   Financial Resource Strain: Not on file  Food Insecurity: Not on file  Transportation Needs: Not on file  Physical Activity: Not on file  Stress: Not on file  Social Connections: Not on file    Review of Systems: Gen: Denies fever, chills, anorexia. Denies fatigue, weakness, weight loss.  CV: Denies chest pain, palpitations, syncope, peripheral edema, and claudication. Resp: Denies dyspnea at rest, cough, wheezing, coughing up blood, and pleurisy. GI: see HPI Derm: Denies rash, itching, dry skin Psych: Denies depression, anxiety, memory loss, confusion. No homicidal or suicidal ideation.  Heme: Denies bruising, bleeding, and enlarged lymph nodes.  Physical Exam: BP 107/67   Pulse (!) 53   Temp (!) 96.9 F (36.1 C) (Temporal)   Ht 5\' 2"  (1.575 m)   Wt 98 lb 6.4 oz (44.6 kg)   LMP 06/29/2018   BMI 18.00 kg/m  General:   Alert and oriented. No distress noted. Pleasant and cooperative.  Head:  Normocephalic and atraumatic. Eyes:  Conjuctiva clear without scleral icterus. Mouth:  Oral mucosa pink and moist. Possible oral thrush but mild Lungs: clear bilaterally Cardiac: S1 S2 present without murmurs Abdomen:  +BS, soft, non-tender and non-distended. No rebound or guarding. No HSM or masses noted. Rectal: Grade 2-3 prolapsing internal hemorrhoids, mild discomfort with exam Msk:  Symmetrical without gross deformities. Normal posture. Extremities:  Without edema. Neurologic:  Alert and  oriented x4 Psych:  Alert and cooperative. Normal mood and affect.  ASSESSMENT: TAMLYN SIDES is a 50 y.o. female presenting today with a history of PUD in setting of NSAIDs with EGD in Sept 2019 but lost to follow-up, constipation, IDA in the past. Last seen in 2019 and lost to follow-up. Now with  dysphagia/odynophagia, constipation, and rectal bleeding.   Dysphagia/odynophagia: acute onset several months ago. Now intermittent. Query candida esophagitis. Last EGD in Sept 2019 with PUD and never had surveillance. Will arrange EGD/dilation in near future for PUD surveillance and dysphagia/odynophagia. Nystatin sent to pharmacy. Continue PPI. Stop aspirin 325 mg daily, which she started on her own.   Constipation: has done well with Linzess in the past but no longer taking. Linzess 290 mcg samples provided along with patient assistance.  Rectal bleeding: most likely due to internal hemorrhoids. Colonoscopy in near future at time of EGD/dilation. Last colonoscopy in remote past at [facility] but records not available. No family history of colorectal cancer or polyps.   PLAN:  Proceed  with colonoscopy/EGD/dilation by Dr. Gala Romney in near future with Propofol: the risks, benefits, and alternatives have been discussed with the patient in detail. The patient states understanding and desires to proceed.  Stop aspirin 325 mg  Continue PPI daily  Start Linzess 290 mcg daily. Samples provided along with patient assistance forms  Anusol cream BID sent to pharmacy  Nystatin swish and swallow.  Return in follow-up thereafter. May be a good banding candidate.  Annitta Needs, PhD, ANP-BC [facility] Gastroenterology

## 2020-11-16 NOTE — Patient Instructions (Signed)
For constipation: start taking Linzess again, 1 capsule 30 minutes prior to breakfast. Let me know if this is not helpful! We have provided samples and patient assistance forms so we can get this covered  I sent in a rectal cream to use twice a day as needed for discomfort.  I also sent in Nystatin suspension to swish around in your mouth as long as you can then swallow four times per day.  We are arranging a colonoscopy, upper endoscopy, and dilation in the near future!  We will see you in follow-up after!  I enjoyed seeing you again today! As you know, I value our relationship and want to provide genuine, compassionate, and quality care. I welcome your feedback. If you receive a survey regarding your visit,  I greatly appreciate you taking time to fill this out. See you next time!  Annitta Needs, PhD, ANP-BC [facility] Gastroenterology

## 2020-11-16 NOTE — Telephone Encounter (Signed)
Client of [facility] called to inquire about her [facility] Financial assistance application that [facility] assisted her in submitting. Checked with Ge[employer] data processor and confirmed client approved for 100% CAFA until 04/06/21. Client notified of approval and discussed when she may renew and that process. Client has specialist appointment today with Gastroenterology.  Will follow as needed.   Debria Garret RN [employer]

## 2020-11-17 ENCOUNTER — Telehealth: Payer: Self-pay | Admitting: *Deleted

## 2020-11-17 NOTE — Telephone Encounter (Signed)
We are awaiting Dr. Roseanne Kaufman July schedule to schedule her for her procedures.

## 2020-11-17 NOTE — Progress Notes (Signed)
CC'ED TO PCP 

## 2020-11-17 NOTE — Telephone Encounter (Signed)
Called pt, LMOVM. Dr. Gala Romney had an opening come up on 6/24. Called to offer appt for TCS/EGD/DIL with propofol, ASA 2

## 2020-11-21 MED ORDER — PEG 3350-KCL-NA BICARB-NACL 420 G PO SOLR
ORAL | 0 refills | Status: DC
Start: 2020-11-21 — End: 2021-05-23
  Filled 2020-11-25: qty 4000, 1d supply, fill #0

## 2020-11-21 NOTE — Telephone Encounter (Signed)
Called pt. She has been scheduled for 6/24 at 7:30am. Aware will mail prep instructions with covid test appt. Confirmed address. Confirmed pharmacy.

## 2020-11-21 NOTE — Addendum Note (Signed)
Addended by: Cheron Every on: 11/21/2020 08:30 AM   Modules accepted: Orders

## 2020-11-21 NOTE — Telephone Encounter (Signed)
Patient also asks if I could mail a set of instructions to her daughter's address [address] in [place].

## 2020-11-25 ENCOUNTER — Other Ambulatory Visit: Payer: Self-pay

## 2020-11-28 ENCOUNTER — Telehealth: Payer: Self-pay

## 2020-11-28 ENCOUNTER — Other Ambulatory Visit: Payer: Self-pay

## 2020-11-28 NOTE — Telephone Encounter (Signed)
Called to let client know that her medications that should could not afford and were not available with [facility], Anusol, Nystation [place]ulytley Have been picked up from [facility] and [place] in [place] as a one time no charge. They are available for her to pick up from [facility] office. Client reports she will pick them up today.  Debria Garret RN [employer].

## 2020-11-29 ENCOUNTER — Telehealth: Payer: Self-pay

## 2020-11-29 NOTE — Telephone Encounter (Signed)
Called and reminded client that her medications are here at [place] from [place] for her to pick up. She states she will be by to pick those up this afternoon.  Mayra Reel RN [employer]

## 2021-01-16 ENCOUNTER — Telehealth: Payer: Self-pay | Admitting: Internal Medicine

## 2021-01-16 ENCOUNTER — Telehealth: Payer: Self-pay

## 2021-01-16 ENCOUNTER — Encounter: Payer: Self-pay | Admitting: Internal Medicine

## 2021-01-16 NOTE — Telephone Encounter (Signed)
Returned the pt's call x 2 phone disconnected.

## 2021-01-16 NOTE — Telephone Encounter (Signed)
PATIENT RETURNED CALL, PLEASE CALL BACK  °

## 2021-01-17 ENCOUNTER — Emergency Department (HOSPITAL_COMMUNITY)
Admission: EM | Admit: 2021-01-17 | Discharge: 2021-01-17 | Disposition: A | Discharge status: Home / Self Care | Payer: Self-pay | Attending: Emergency Medicine | Admitting: Emergency Medicine

## 2021-01-17 ENCOUNTER — Encounter (HOSPITAL_COMMUNITY): Payer: Self-pay | Admitting: Emergency Medicine

## 2021-01-17 ENCOUNTER — Other Ambulatory Visit: Payer: Self-pay

## 2021-01-17 DIAGNOSIS — J45909 Unspecified asthma, uncomplicated: Secondary | ICD-10-CM | POA: Insufficient documentation

## 2021-01-17 DIAGNOSIS — F17210 Nicotine dependence, cigarettes, uncomplicated: Secondary | ICD-10-CM | POA: Insufficient documentation

## 2021-01-17 DIAGNOSIS — H5789 Other specified disorders of eye and adnexa: Secondary | ICD-10-CM

## 2021-01-17 DIAGNOSIS — Z8541 Personal history of malignant neoplasm of cervix uteri: Secondary | ICD-10-CM | POA: Insufficient documentation

## 2021-01-17 MED ORDER — TETRACAINE HCL 0.5 % OP SOLN
1.0000 [drp] | Freq: Once | OPHTHALMIC | Status: AC
  Administered 2021-01-17: 1 [drp] via OPHTHALMIC
  Filled 2021-01-17: qty 4

## 2021-01-17 NOTE — ED Provider Notes (Signed)
[facility] Emergency Department Provider Note MRN:  [id number]  Arrival date & time: 01/17/21     Chief Complaint   Eye Pain   History of Present Illness   Marissa Frank is a 50 y.o. year-old female with no pertinent past medical presenting to the ED with chief complaint of eye pain.  Patient was sprayed in the eyes with alcohol and sanitizer and has been experiencing eye burning discomfort since that time.  Washed her eyes thoroughly for 20 minutes but still burning.  Denies any other injuries or complaints.  No vision loss.  Review of Systems  A problem-focused ROS was performed. Positive for eye pain.  Patient denies other trauma.  Patient's Health History    Past Medical History:  Diagnosis Date   Asthma    Cancer ([place])    cervical   Depression    Hemorrhoids     Past Surgical History:  Procedure Laterality Date   BIOPSY  04/24/2018   Procedure: BIOPSY;  Surgeon: Daneil Dolin, MD;  [facility];  Service: Endoscopy;;   BREAST SURGERY     benign tumor   CERVICAL CONE BIOPSY     ESOPHAGOGASTRODUODENOSCOPY (EGD) WITH PROPOFOL N/A 04/24/2018   reflux esophagitis s/p dilation, small hiatal hernia, multiple small gastric ulcers s/p biopsy, reactive gastropathy, no erosions. +NSAIDs at that time.   MALONEY DILATION N/A 04/24/2018   Procedure: Venia Minks DILATION;  Surgeon: Daneil Dolin, MD;  [facility];  Service: Endoscopy;  Laterality: N/A;   TUBAL LIGATION     WISDOM TOOTH EXTRACTION      Family History  Problem Relation Age of Onset   Hypertension Mother    Stroke Mother    Colon cancer Neg Hx    Colon polyps Neg Hx    Stomach cancer Neg Hx    Liver disease Neg Hx     Social History   Socioeconomic History   Marital status: Single    Spouse name: Not on file   Number of children: Not on file   Years of education: Not on file   Highest education level: Not on file  Occupational History   Not on file   Tobacco Use   Smoking status: Every Day    Packs/day: 0.50    Pack years: 0.00    Types: Cigarettes   Smokeless tobacco: Never  Vaping Use   Vaping Use: Former  Substance and Sexual Activity   Alcohol use: Yes    Alcohol/week: 7.0 standard drinks    Types: 7 Glasses of wine per week    Comment: 11/16/20-no drink in 2 weeks; previously drank glass of wine before bedtime   Drug use: Not Currently    Types: Marijuana    Comment: none since teenager   Sexual activity: Yes    Birth control/protection: Surgical, Post-menopausal  Other Topics Concern   Not on file  Social History Narrative   Not on file   Social Determinants of Health   Financial Resource Strain: Not on file  Food Insecurity: Not on file  Transportation Needs: Not on file  Physical Activity: Not on file  Stress: Not on file  Social Connections: Not on file  Intimate Partner Violence: Not on file     Physical Exam   Vitals:   01/17/21 0638  BP: 128/87  Pulse: (!) 44  Resp: 15  Temp: 97.6 F (36.4 C)  SpO2: 100%    CONSTITUTIONAL: Well-appearing, NAD NEURO:  Alert  and oriented x 3, no focal deficits EYES:  eyes equal and reactive, normal extraocular movements, no significant erythema or abnormalities noted ENT/NECK:  no LAD, no JVD CARDIO: Regular rate, well-perfused, normal S1 and S2 PULM:  CTAB no wheezing or rhonchi GI/GU:  normal bowel sounds, non-distended, non-tender MSK/SPINE:  No gross deformities, no edema SKIN:  no rash, atraumatic PSYCH:  Appropriate speech and behavior  *Additional and/or pertinent findings included in MDM below  Diagnostic and Interventional Summary    EKG Interpretation  Date/Time:    Ventricular Rate:    PR Interval:    QRS Duration:   QT Interval:    QTC Calculation:   R Axis:     Text Interpretation:          Labs Reviewed - No data to display  No orders to display    Medications  tetracaine (PONTOCAINE) 0.5 % ophthalmic solution 1-2 drop (has no  administration in time range)     Procedures  /  Critical Care Procedures  ED Course and Medical Decision Making  I have reviewed the triage vital signs, the nursing notes, and pertinent available records from the EMR.  Listed above are laboratory and imaging tests that I personally ordered, reviewed, and interpreted and then considered in my medical decision making (see below for details).  Providing tetracaine and will test pH of the eyes.     Patient feeling much better after tetracaine.  pH of the eyes are normal bilaterally.  No evidence of abrasion or conjunctivitis, visual acuity is normal.  Patient is appropriate for discharge with reassurance, return precautions for worsening pain or vision loss.  Barth Kirks. Sedonia Small, [place] [email or web address]  Final Clinical Impressions(s) / ED Diagnoses     ICD-10-CM   1. Eye irritation  H57.89       ED Discharge Orders     None        Discharge Instructions Discussed with and Provided to Patient:     Discharge Instructions      You were evaluated in the Emergency Department and after careful evaluation, we did not find any emergent condition requiring admission or further testing in the hospital.  Your exam/testing today was overall reassuring.  Symptoms seem to be due to irritation from the alcohol hand sanitizer.  This should improve and resolve throughout the day today.  Please return to the Emergency Department if you experience any worsening of your condition.  Thank you for allowing [place] to be a part of your care.         Maudie Flakes, MD 01/17/21 [phone]

## 2021-01-17 NOTE — ED Triage Notes (Signed)
Pt accidentally got hand sanitizer in left eye while at work. Tried the eye wash station for about 20 minutes with no improvement.

## 2021-01-17 NOTE — Telephone Encounter (Signed)
Phone the pt back and answered all her questions regarding her procedure regarding what to eat.

## 2021-01-17 NOTE — Discharge Instructions (Addendum)
You were evaluated in the Emergency Department and after careful evaluation, we did not find any emergent condition requiring admission or further testing in the hospital.  Your exam/testing today was overall reassuring.  Symptoms seem to be due to irritation from the alcohol hand sanitizer.  This should improve and resolve throughout the day today.  Please return to the Emergency Department if you experience any worsening of your condition.  Thank you for allowing [place] to be a part of your care.

## 2021-01-18 ENCOUNTER — Other Ambulatory Visit (HOSPITAL_COMMUNITY): Payer: Self-pay

## 2021-01-20 ENCOUNTER — Encounter (HOSPITAL_COMMUNITY): Payer: Self-pay | Admitting: Internal Medicine

## 2021-01-20 ENCOUNTER — Ambulatory Visit (HOSPITAL_COMMUNITY): Payer: Self-pay | Admitting: Anesthesiology

## 2021-01-20 ENCOUNTER — Encounter (HOSPITAL_COMMUNITY)
Admission: RE | Disposition: A | Discharge status: Home / Self Care | Payer: Self-pay | Source: Home / Self Care | Attending: Internal Medicine

## 2021-01-20 ENCOUNTER — Ambulatory Visit (HOSPITAL_COMMUNITY)
Admission: RE | Admit: 2021-01-20 | Discharge: 2021-01-20 | Disposition: A | Discharge status: Home / Self Care | Payer: Self-pay | Attending: Internal Medicine | Admitting: Internal Medicine

## 2021-01-20 ENCOUNTER — Other Ambulatory Visit: Payer: Self-pay

## 2021-01-20 DIAGNOSIS — K641 Second degree hemorrhoids: Secondary | ICD-10-CM | POA: Insufficient documentation

## 2021-01-20 DIAGNOSIS — K3189 Other diseases of stomach and duodenum: Secondary | ICD-10-CM | POA: Insufficient documentation

## 2021-01-20 DIAGNOSIS — Z79899 Other long term (current) drug therapy: Secondary | ICD-10-CM | POA: Insufficient documentation

## 2021-01-20 DIAGNOSIS — K635 Polyp of colon: Secondary | ICD-10-CM

## 2021-01-20 DIAGNOSIS — F17210 Nicotine dependence, cigarettes, uncomplicated: Secondary | ICD-10-CM | POA: Insufficient documentation

## 2021-01-20 DIAGNOSIS — Z7982 Long term (current) use of aspirin: Secondary | ICD-10-CM | POA: Insufficient documentation

## 2021-01-20 DIAGNOSIS — R1310 Dysphagia, unspecified: Secondary | ICD-10-CM | POA: Insufficient documentation

## 2021-01-20 DIAGNOSIS — D125 Benign neoplasm of sigmoid colon: Secondary | ICD-10-CM | POA: Insufficient documentation

## 2021-01-20 DIAGNOSIS — K449 Diaphragmatic hernia without obstruction or gangrene: Secondary | ICD-10-CM | POA: Insufficient documentation

## 2021-01-20 DIAGNOSIS — K5730 Diverticulosis of large intestine without perforation or abscess without bleeding: Secondary | ICD-10-CM | POA: Insufficient documentation

## 2021-01-20 DIAGNOSIS — K921 Melena: Secondary | ICD-10-CM | POA: Insufficient documentation

## 2021-01-20 HISTORY — PX: COLONOSCOPY WITH PROPOFOL: SHX5780

## 2021-01-20 HISTORY — PX: POLYPECTOMY: SHX5525

## 2021-01-20 HISTORY — PX: MALONEY DILATION: SHX5535

## 2021-01-20 HISTORY — PX: ESOPHAGOGASTRODUODENOSCOPY (EGD) WITH PROPOFOL: SHX5813

## 2021-01-20 HISTORY — PX: BIOPSY: SHX5522

## 2021-01-20 SURGERY — COLONOSCOPY WITH PROPOFOL
Anesthesia: General

## 2021-01-20 MED ORDER — PROPOFOL 500 MG/50ML IV EMUL
INTRAVENOUS | Status: DC | PRN
  Administered 2021-01-20: 150 ug/kg/min via INTRAVENOUS

## 2021-01-20 MED ORDER — PROPOFOL 10 MG/ML IV BOLUS
INTRAVENOUS | Status: DC | PRN
  Administered 2021-01-20: 100 mg via INTRAVENOUS
  Administered 2021-01-20: 50 mg via INTRAVENOUS

## 2021-01-20 MED ORDER — LACTATED RINGERS IV SOLN: INTRAVENOUS | Status: DC

## 2021-01-20 MED ORDER — LIDOCAINE HCL (CARDIAC) PF 100 MG/5ML IV SOSY
PREFILLED_SYRINGE | INTRAVENOUS | Status: DC | PRN
  Administered 2021-01-20 (×2): 50 mg via INTRAVENOUS

## 2021-01-20 NOTE — Anesthesia Procedure Notes (Signed)
Date/Time: 01/20/2021 7:38 AM Performed by: Orlie Dakin, CRNA Pre-anesthesia Checklist: Patient identified, Emergency Drugs available, Suction available and Patient being monitored Patient Re-evaluated:Patient Re-evaluated prior to induction Oxygen Delivery Method: Nasal cannula Induction Type: IV induction Placement Confirmation: positive ETCO2

## 2021-01-20 NOTE — Transfer of Care (Signed)
Immediate Anesthesia Transfer of Care Note  Patient: Marissa Frank  Procedure(s) Performed: COLONOSCOPY WITH PROPOFOL ESOPHAGOGASTRODUODENOSCOPY (EGD) WITH PROPOFOL [place] BIOPSY POLYPECTOMY  Patient Location: Endoscopy Unit  Anesthesia Type:General  Level of Consciousness: drowsy  Airway & Oxygen Therapy: Patient Spontanous Breathing  Post-op Assessment: Report given to RN and Post -op Vital signs reviewed and stable  Post vital signs: Reviewed and stable  Last Vitals:  Vitals Value Taken Time  BP    Temp    Pulse    Resp    SpO2      Last Pain:  Vitals:   01/20/21 0731  TempSrc:   PainSc: 0-No pain         Complications: No notable events documented.

## 2021-01-20 NOTE — Op Note (Signed)
[facility] Patient Name: Marissa Frank Procedure Date: 01/20/2021 7:48 AM MRN: [id number] Date of Birth: 09-Jan-1971 Attending MD: Norvel Richards , MD CSN: [id number] Age: 50 Admit Type: Outpatient Procedure:                Colonoscopy Indications:              Hematochezia Providers:                Norvel Richards, MD, Jeanann Lewandowsky. Sharon Seller, RN,                            Nelma Rothman, Technician Referring MD:              Medicines:                Propofol per Anesthesia Complications:            No immediate complications. Estimated Blood Loss:     Estimated blood loss was minimal. Procedure:                Pre-Anesthesia Assessment:                           - Prior to the procedure, a History and Physical                            was performed, and patient medications and                            allergies were reviewed. The patient's tolerance of                            previous anesthesia was also reviewed. The risks                            and benefits of the procedure and the sedation                            options and risks were discussed with the patient.                            All questions were answered, and informed consent                            was obtained. Prior Anticoagulants: The patient has                            taken no previous anticoagulant or antiplatelet                            agents. ASA Grade Assessment: II - A patient with                            mild systemic disease. After reviewing the risks  and benefits, the patient was deemed in                            satisfactory condition to undergo the procedure.                           After obtaining informed consent, the colonoscope                            was passed under direct vision. Throughout the                            procedure, the patient's blood pressure, pulse, and                            oxygen saturations  were monitored continuously. The                            [id number]-[id number] ([id number]) scope was introduced through                            the anus and advanced to the the cecum, identified                            by appendiceal orifice and ileocecal valve. The                            colonoscopy was performed without difficulty. The                            patient tolerated the procedure well. The ileocecal                            valve, appendiceal orifice, and rectum were                            photographed. The entire colon was well visualized. Scope In: 7:52:30 AM Scope Out: 8:04:24 AM Scope Withdrawal Time: 0 hours 6 minutes 29 seconds  Total Procedure Duration: 0 hours 11 minutes 54 seconds  Findings:      Hemorrhoids were found on perianal exam.      Hemorrhoids were found during retroflexion. The hemorrhoids were       moderate, medium-sized and Grade II (internal hemorrhoids that prolapse       but reduce spontaneously).      A 5 mm polyp was found in the sigmoid colon. The polyp was       semi-pedunculated. The polyp was removed with a cold snare. Resection       and retrieval were complete. Estimated blood loss was minimal.      Scattered small-mouthed diverticula were found in the sigmoid colon and       descending colon.      The exam was otherwise without abnormality on direct and retroflexion       views. Impression:               - Hemorrhoids found  on perianal exam.                           - One 5 mm polyp in the sigmoid colon, removed with                            a cold snare. Resected and retrieved.                           - Diverticulosis in the sigmoid colon and in the                            descending colon.                           - The examination was otherwise normal on direct                            and retroflexion views. I suspect bleeding from                            hemorrhoids in the setting of constipation. Moderate  Sedation:      Moderate (conscious) sedation was personally administered by an       anesthesia professional. The following parameters were monitored: oxygen       saturation, heart rate, blood pressure, respiratory rate, EKG, adequacy       of pulmonary ventilation, and response to care. Recommendation:           - Patient has a contact number available for                            emergencies. The signs and symptoms of potential                            delayed complications were discussed with the                            patient. Return to normal activities tomorrow.                            Written discharge instructions were provided to the                            patient.                           - Resume previous diet.                           - Continue present medications.                           - Repeat colonoscopy date to be determined after                            pending pathology  results are reviewed for                            surveillance.                           - Return to GI office in 2 months. Resume Linzess                            290 capsule daily. Benefiber 1 tablespoon daily.                            May be a good hemorrhoid banding candidate.                            Pamphlet on hemorrhoid banding provided. See EGD                            report. Procedure Code(s):        --- Professional ---                           [phone], Colonoscopy, flexible; with removal of                            tumor(s), polyp(s), or other lesion(s) by snare                            technique Diagnosis Code(s):        --- Professional ---                           K64.1, Second degree hemorrhoids                           K63.5, Polyp of colon                           K92.1, Melena (includes Hematochezia)                           K57.30, Diverticulosis of large intestine without                            perforation or abscess without  bleeding CPT copyright 2019 American Medical Association. All rights reserved. The codes documented in this report are preliminary and upon coder review may  be revised to meet current compliance requirements. Cristopher Estimable. Haydn Hutsell, MD Norvel Richards, MD 01/20/2021 8:20:59 AM This report has been signed electronically. Number of Addenda: 0

## 2021-01-20 NOTE — Op Note (Signed)
[facility] Patient Name: Marissa Frank Procedure Date: 01/20/2021 7:06 AM MRN: [id number] Date of Birth: [birth year] Attending MD: Norvel Richards , MD CSN: [id number] Age: 50 Admit Type: Outpatient Procedure:                Upper GI endoscopy Indications:              Dysphagia Providers:                Norvel Richards, MD, Jeanann Lewandowsky. Sharon Seller, RN,                            Nelma Rothman, Technician Referring MD:              Medicines:                Propofol per Anesthesia Complications:            No immediate complications. Estimated Blood Loss:     Estimated blood loss was minimal. Procedure:                Pre-Anesthesia Assessment:                           - Prior to the procedure, a History and Physical                            was performed, and patient medications and                            allergies were reviewed. The patient's tolerance of                            previous anesthesia was also reviewed. The risks                            and benefits of the procedure and the sedation                            options and risks were discussed with the patient.                            All questions were answered, and informed consent                            was obtained. Prior Anticoagulants: The patient has                            taken no previous anticoagulant or antiplatelet                            agents. ASA Grade Assessment: II - A patient with                            mild systemic disease. After reviewing the risks  and benefits, the patient was deemed in                            satisfactory condition to undergo the procedure.                           After obtaining informed consent, the endoscope was                            passed under direct vision. Throughout the                            procedure, the patient's blood pressure, pulse, and                            oxygen saturations  were monitored continuously. The                            GIF-H190 ([id number]) scope was introduced through the                            mouth, and advanced to the second part of duodenum.                            The upper GI endoscopy was accomplished without                            difficulty. The patient tolerated the procedure                            well. Scope In: 7:37:17 AM Scope Out: 7:45:26 AM Total Procedure Duration: 0 hours 8 minutes 9 seconds  Findings:      The examined esophagus was normal. The scope was withdrawn. Dilation was       performed with a Maloney dilator with mild resistance at 46 Fr. The       dilation site was examined following endoscope reinsertion and showed no       change. Estimated blood loss was minimal.      A small hiatal hernia was present. Some gastric erosions in the antrum.       No ulcer or infiltrating process seen. Finally, biopsies the antrum were       taken for histologic study.      The duodenal bulb and second portion of the duodenum were normal. Impression:               - Normal esophagus. Dilated.                           - Small hiatal hernia. Antral erosions?"status post                            biopsy                           - Normal duodenum. Moderate Sedation:      Moderate (conscious) sedation was  personally administered by an       anesthesia professional. The following parameters were monitored: oxygen       saturation, heart rate, blood pressure, respiratory rate, EKG, adequacy       of pulmonary ventilation, and response to care. Recommendation:           - Patient has a contact number available for                            emergencies. The signs and symptoms of potential                            delayed complications were discussed with the                            patient. Return to normal activities tomorrow.                            Written discharge instructions were provided to the                             patient.                           - Advance diet as tolerated. Begin Protonix 40 mg                            once daily. Follow-up on pathology. See colonoscopy                            report. Procedure Code(s):        --- Professional ---                           [phone], Esophagogastroduodenoscopy, flexible,                            transoral; diagnostic, including collection of                            specimen(s) by brushing or washing, when performed                            (separate procedure)                           43450, Dilation of esophagus, by unguided sound or                            bougie, single or multiple passes Diagnosis Code(s):        --- Professional ---                           K44.9, Diaphragmatic hernia without obstruction or                            gangrene  R13.10, Dysphagia, unspecified CPT copyright 2019 [employer] Association. All rights reserved. The codes documented in this report are preliminary and upon coder review may  be revised to meet current compliance requirements. Cristopher Estimable. Dianey Suchy, MD Norvel Richards, MD 01/20/2021 8:17:13 AM This report has been signed electronically. Number of Addenda: 0

## 2021-01-20 NOTE — Discharge Instructions (Addendum)
Colonoscopy Discharge Instructions  Read the instructions outlined below and refer to this sheet in the next few weeks. These discharge instructions provide you with general information on caring for yourself after you leave the hospital. Your doctor may also give you specific instructions. While your treatment has been planned according to the most current medical practices available, unavoidable complications occasionally occur. If you have any problems or questions after discharge, call Dr. Gala Romney at [phone]. ACTIVITY You may resume your regular activity, but move at a slower pace for the next 24 hours.  Take frequent rest periods for the next 24 hours.  Walking will help get rid of the air and reduce the bloated feeling in your belly (abdomen).  No driving for 24 hours (because of the medicine (anesthesia) used during the test).   Do not sign any important legal documents or operate any machinery for 24 hours (because of the anesthesia used during the test).  NUTRITION Drink plenty of fluids.  You may resume your normal diet as instructed by your doctor.  Begin with a light meal and progress to your normal diet. Heavy or fried foods are harder to digest and may make you feel sick to your stomach (nauseated).  Avoid alcoholic beverages for 24 hours or as instructed.  MEDICATIONS You may resume your normal medications unless your doctor tells you otherwise.  WHAT YOU CAN EXPECT TODAY Some feelings of bloating in the abdomen.  Passage of more gas than usual.  Spotting of blood in your stool or on the toilet paper.  IF YOU HAD POLYPS REMOVED DURING THE COLONOSCOPY: No aspirin products for 7 days or as instructed.  No alcohol for 7 days or as instructed.  Eat a soft diet for the next 24 hours.  FINDING OUT THE RESULTS OF YOUR TEST Not all test results are available during your visit. If your test results are not back during the visit, make an appointment with your caregiver to find out the  results. Do not assume everything is normal if you have not heard from your caregiver or the medical facility. It is important for you to follow up on all of your test results.  SEEK IMMEDIATE MEDICAL ATTENTION IF: You have more than a spotting of blood in your stool.  Your belly is swollen (abdominal distention).  You are nauseated or vomiting.  You have a temperature over 101.  You have abdominal pain or discomfort that is severe or gets worse throughout the day.    EGD Discharge instructions Please read the instructions outlined below and refer to this sheet in the next few weeks. These discharge instructions provide you with general information on caring for yourself after you leave the hospital. Your doctor may also give you specific instructions. While your treatment has been planned according to the most current medical practices available, unavoidable complications occasionally occur. If you have any problems or questions after discharge, please call your doctor. ACTIVITY You may resume your regular activity but move at a slower pace for the next 24 hours.  Take frequent rest periods for the next 24 hours.  Walking will help expel (get rid of) the air and reduce the bloated feeling in your abdomen.  No driving for 24 hours (because of the anesthesia (medicine) used during the test).  You may shower.  Do not sign any important legal documents or operate any machinery for 24 hours (because of the anesthesia used during the test).  NUTRITION Drink plenty of fluids.  You  may resume your normal diet.  Begin with a light meal and progress to your normal diet.  Avoid alcoholic beverages for 24 hours or as instructed by your caregiver.  MEDICATIONS You may resume your normal medications unless your caregiver tells you otherwise.  WHAT YOU CAN EXPECT TODAY You may experience abdominal discomfort such as a feeling of fullness or "gas" pains.  FOLLOW-UP Your doctor will discuss the results  of your test with you.  SEEK IMMEDIATE MEDICAL ATTENTION IF ANY OF THE FOLLOWING OCCUR: Excessive nausea (feeling sick to your stomach) and/or vomiting.  Severe abdominal pain and distention (swelling).  Trouble swallowing.  Temperature over 101 F (37.8 C).  Rectal bleeding or vomiting of blood.    1 polyp removed from your colon today  Hemorrhoid, polyp, constipation and diverticulosis information provided  Begin Benefiber 1 tablespoon daily  Resume Linzess 290 capsule daily.  Prescription provided.  Go by my office for free samples.  Plan on hemorrhoid banding provided  Office visit with Roseanne Kaufman and 2 months  Stomach was biopsied.  Further recommendations to follow pending review of pathology report  Begin Protonix 40 mg daily for acid reflux prescription provided  Patient request, I called Francene Castle at [phone]  PATIENT INSTRUCTIONS POST-ANESTHESIA  IMMEDIATELY FOLLOWING SURGERY:  Do not drive or operate machinery for the first twenty four hours after surgery.  Do not make any important decisions for twenty four hours after surgery or while taking narcotic pain medications or sedatives.  If you develop intractable nausea and vomiting or a severe headache please notify your doctor immediately.  FOLLOW-UP:  Please make an appointment with your surgeon as instructed. You do not need to follow up with anesthesia unless specifically instructed to do so.  WOUND CARE INSTRUCTIONS (if applicable):  Keep a dry clean dressing on the anesthesia/puncture wound site if there is drainage.  Once the wound has quit draining you may leave it open to air.  Generally you should leave the bandage intact for twenty four hours unless there is drainage.  If the epidural site drains for more than 36-48 hours please call the anesthesia department.  QUESTIONS?:  Please feel free to call your physician or the hospital operator if you have any questions, and they will be happy to assist you.

## 2021-01-20 NOTE — Anesthesia Preprocedure Evaluation (Addendum)
Anesthesia Evaluation  Patient identified by MRN, date of birth, ID band Patient awake    Reviewed: Allergy & Precautions, NPO status , Patient's Chart, lab work & pertinent test results  History of Anesthesia Complications Negative for: history of anesthetic complications  Airway Mallampati: II  TM Distance: >3 FB Neck ROM: Full    Dental  (+) Dental Advisory Given, Missing, Chipped   Pulmonary asthma , Current SmokerPatient did not abstain from smoking.,    Pulmonary exam normal breath sounds clear to auscultation       Cardiovascular Exercise Tolerance: Good negative cardio ROS Normal cardiovascular exam Rhythm:Regular Rate:Normal     Neuro/Psych PSYCHIATRIC DISORDERS Depression negative neurological ROS     GI/Hepatic Neg liver ROS, PUD,   Endo/Other  negative endocrine ROS  Renal/GU negative Renal ROS  Female GU complaint (cervical cancer)     Musculoskeletal   Abdominal   Peds  Hematology negative hematology ROS (+) anemia ,   Anesthesia Other Findings   Reproductive/Obstetrics                            Anesthesia Physical Anesthesia Plan  ASA: 2  Anesthesia Plan: General   Post-op Pain Management:    Induction: Intravenous  PONV Risk Score and Plan: Propofol infusion  Airway Management Planned: Nasal Cannula and Natural Airway  Additional Equipment:   Intra-op Plan:   Post-operative Plan:   Informed Consent: I have reviewed the patients History and Physical, chart, labs and discussed the procedure including the risks, benefits and alternatives for the proposed anesthesia with the patient or authorized representative who has indicated his/her understanding and acceptance.     Dental advisory given  Plan Discussed with: CRNA and Surgeon  Anesthesia Plan Comments:         Anesthesia Quick Evaluation

## 2021-01-20 NOTE — H&P (Signed)
@LOGO @   Primary Care Physician:  Soyla Dryer, PA-C Primary Gastroenterologist:  Dr. Gala Romney  Pre-Procedure History & Physical: HPI:  Marissa Frank is a 50 y.o. female here for further evaluation of dysphagia vague odynophagia and rectal bleeding in setting of constipation. GERD fairly well controlled on omeprazole 40 mg daily.  Constipation ongoing issue.  Linzess 290 once daily works great.  Has trouble getting it.  No insurance. Remittent rectal bleeding. Past Medical History:  Diagnosis Date   Asthma    Cancer ([place])    cervical   Depression    Hemorrhoids     Past Surgical History:  Procedure Laterality Date   BIOPSY  04/24/2018   Procedure: BIOPSY;  Surgeon: Daneil Dolin, MD;  Location: [facility];  Service: Endoscopy;;   BREAST SURGERY     benign tumor   CERVICAL [place] BIOPSY     ESOPHAGOGASTRODUODENOSCOPY (EGD) WITH PROPOFOL N/A 04/24/2018   reflux esophagitis s/p dilation, small hiatal hernia, multiple small gastric ulcers s/p biopsy, reactive gastropathy, no erosions. +NSAIDs at that time.   MALONEY DILATION N/A 04/24/2018   Procedure: Venia Minks DILATION;  Surgeon: Daneil Dolin, MD;  Location: [facility];  Service: Endoscopy;  Laterality: N/A;   TUBAL LIGATION     WISDOM TOOTH EXTRACTION      Prior to Admission medications   Medication Sig Start Date End Date Taking? Authorizing Provider  albuterol (PROVENTIL) (2.5 MG/3ML) 0.083% nebulizer solution Take 2.5 mg by nebulization every 6 (six) hours as needed for wheezing or shortness of breath.   Yes [provider]  aspirin 325 MG tablet Take 325 mg by mouth daily.    Yes [provider]  ferrous sulfate 325 (65 FE) MG tablet Take 325 mg by mouth daily as needed (feeling weak).   Yes [provider]  ibuprofen (ADVIL) 200 MG tablet Take 400 mg by mouth every 8 (eight) hours as needed for moderate pain.   Yes [provider]  linaclotide (LINZESS) 290 MCG CAPS capsule  Take 290 mcg by mouth daily before breakfast.   Yes [provider]  hydrocortisone (ANUSOL-HC) 2.5 % rectal cream Place 1 application rectally 2 (two) times daily. Patient not taking: Reported on 01/12/2021 11/16/20   Annitta Needs, NP  methocarbamol (ROBAXIN) 500 MG tablet Take 1 tablet (500 mg total) by mouth 2 (two) times daily. Patient not taking: Reported on 01/12/2021 09/03/20   Eustaquio Maize, PA-C  nystatin (MYCOSTATIN) 100000 UNIT/ML suspension Take 5 mLs (500,000 Units total) by mouth 4 (four) times daily. Patient not taking: Reported on 01/12/2021 11/16/20   Annitta Needs, NP  omeprazole (PRILOSEC) 40 MG capsule Take 1 capsule (40 mg total) by mouth daily. Patient not taking: Reported on 01/12/2021 10/04/20   Soyla Dryer, PA-C  polyethylene glycol-electrolytes (NULYTELY) 420 g solution As directed 11/21/20   Daneil Dolin, MD    Allergies as of 11/21/2020 - Review Complete 11/16/2020  Allergen Reaction Noted   Almond oil Itching 03/30/2011   Carrot [daucus carota] Itching 03/16/2015   Celery oil Itching 03/16/2015   Coconut flavor Nausea And Vomiting and Other (See Comments) 03/30/2011   Macadamia nut oil Itching 03/30/2011    Family History  Problem Relation Age of Onset   Hypertension Mother    Stroke Mother    Colon cancer Neg Hx    Colon polyps Neg Hx    Stomach cancer Neg Hx    Liver disease Neg Hx  Social History   Socioeconomic History   Marital status: Single    Spouse name: Not on file   Number of children: Not on file   Years of education: Not on file   Highest education level: Not on file  Occupational History   Not on file  Tobacco Use   Smoking status: Every Day    Packs/day: 0.50    Pack years: 0.00    Types: Cigarettes   Smokeless tobacco: Never  Vaping Use   Vaping Use: Former  Substance and Sexual Activity   Alcohol use: Yes    Alcohol/week: 7.0 standard drinks    Types: 7 Glasses of wine per week    Comment: 11/16/20-no drink  in 2 weeks; previously drank glass of wine before bedtime   Drug use: Not Currently    Types: Marijuana    Comment: none since teenager   Sexual activity: Yes    Birth control/protection: Surgical, Post-menopausal  Other Topics Concern   Not on file  Social History Narrative   Not on file   Social Determinants of Health   Financial Resource Strain: Not on file  Food Insecurity: Not on file  Transportation Needs: Not on file  Physical Activity: Not on file  Stress: Not on file  Social Connections: Not on file  Intimate Partner Violence: Not on file    Review of Systems: See HPI, otherwise negative ROS  Physical Exam: BP 106/72   Pulse (!) 52   Temp 98.2 F (36.8 C) (Oral)   Resp 14   LMP 06/29/2018   SpO2 99%  General:   Alert,  Well-developed, well-nourished, pleasant and cooperative in NAD SNeck:  Supple; no masses or thyromegaly. No significant cervical adenopathy. Lungs:  Clear throughout to auscultation.   No wheezes, crackles, or rhonchi. No acute distress. Heart:  Regular rate and rhythm; no murmurs, clicks, rubs,  or gallops. Abdomen: Non-distended, normal bowel sounds.  Soft and nontender without appreciable mass or hepatosplenomegaly.  Pulses:  Normal pulses noted. Extremities:  Without clubbing or edema.  Impression/Plan: 50 year old lady with longstanding GERD vague esophageal dysphagia here for EGD with possible esophageal dilation as feasible/appropriate per plan. Chronic constipation with intermittent rectal bleeding.  Colonoscopy also being performed at the same time today. The risks, benefits, limitations, imponderables and alternatives regarding both EGD and colonoscopy have been reviewed with the patient. Questions have been answered. All parties agreeable.       Notice: This dictation was prepared with Dragon dictation along with smaller phrase technology. Any transcriptional errors that result from this process are unintentional and may not be  corrected upon review.

## 2021-01-20 NOTE — Anesthesia Postprocedure Evaluation (Signed)
Anesthesia Post Note  Patient: Marissa Frank  Procedure(s) Performed: COLONOSCOPY WITH PROPOFOL ESOPHAGOGASTRODUODENOSCOPY (EGD) WITH PROPOFOL [place] POLYPECTOMY  Patient location during evaluation: Endoscopy Anesthesia Type: General Level of consciousness: awake and alert and oriented Pain management: pain level controlled Vital Signs Assessment: post-procedure vital signs reviewed and stable Respiratory status: spontaneous breathing and respiratory function stable Cardiovascular status: blood pressure returned to baseline and stable Postop Assessment: no apparent nausea or vomiting Anesthetic complications: no   No notable events documented.   Last Vitals:  Vitals:   01/20/21 0652 01/20/21 0808  BP: 106/72 91/61  Pulse: (!) 52 66  Resp: 14 18  Temp: 36.8 C 36.7 C  SpO2: 99% 99%    Last Pain:  Vitals:   01/20/21 0808  TempSrc: Axillary  PainSc: 0-No pain                 Castulo Scarpelli C Castin Donaghue

## 2021-01-23 ENCOUNTER — Telehealth: Payer: Self-pay | Admitting: *Deleted

## 2021-01-23 LAB — SURGICAL PATHOLOGY

## 2021-01-23 NOTE — Telephone Encounter (Signed)
Pt came by office to pick up Linzess 290 mcg samples.  Said Dr. Gala Romney advised her to come pick up.

## 2021-01-24 ENCOUNTER — Encounter: Payer: Self-pay | Admitting: Internal Medicine

## 2021-01-24 ENCOUNTER — Telehealth: Payer: Self-pay

## 2021-01-24 NOTE — Telephone Encounter (Signed)
Please NIC for 10 year colonoscopy

## 2021-01-26 ENCOUNTER — Encounter (HOSPITAL_COMMUNITY): Payer: Self-pay | Admitting: Internal Medicine

## 2021-02-18 ENCOUNTER — Other Ambulatory Visit: Payer: Self-pay

## 2021-02-18 ENCOUNTER — Encounter (HOSPITAL_COMMUNITY): Payer: Self-pay | Admitting: *Deleted

## 2021-02-18 ENCOUNTER — Emergency Department (HOSPITAL_COMMUNITY)
Admission: EM | Admit: 2021-02-18 | Discharge: 2021-02-18 | Disposition: A | Discharge status: Home / Self Care | Payer: Self-pay | Attending: Emergency Medicine | Admitting: Emergency Medicine

## 2021-02-18 DIAGNOSIS — Z5321 Procedure and treatment not carried out due to patient leaving prior to being seen by health care provider: Secondary | ICD-10-CM | POA: Insufficient documentation

## 2021-02-18 DIAGNOSIS — Z046 Encounter for general psychiatric examination, requested by authority: Secondary | ICD-10-CM | POA: Insufficient documentation

## 2021-02-18 DIAGNOSIS — R45851 Suicidal ideations: Secondary | ICD-10-CM | POA: Insufficient documentation

## 2021-02-18 NOTE — ED Triage Notes (Signed)
Pt thinks of suicide by denies any plan or intentions on acting on them.  Pt's SO lives with her and pt wants him out, states he is controlling.

## 2021-02-18 NOTE — ED Provider Notes (Signed)
Went to assess the patient cannot locate her in the room, made attempts to locate her but was unsuccessful.  It appears patient had eloped, if she would like to come back and be reassessed be more than happy to see her.   Marcello Fennel, PA-C 02/18/21 2302    Milton Ferguson, MD 02/20/21 1039

## 2021-04-03 ENCOUNTER — Emergency Department (HOSPITAL_COMMUNITY): Payer: Self-pay

## 2021-04-03 ENCOUNTER — Other Ambulatory Visit: Payer: Self-pay

## 2021-04-03 ENCOUNTER — Encounter (HOSPITAL_COMMUNITY): Payer: Self-pay

## 2021-04-03 ENCOUNTER — Emergency Department (HOSPITAL_COMMUNITY)
Admission: EM | Admit: 2021-04-03 | Discharge: 2021-04-04 | Disposition: A | Discharge status: Home / Self Care | Payer: Self-pay | Attending: Emergency Medicine | Admitting: Emergency Medicine

## 2021-04-03 DIAGNOSIS — J45909 Unspecified asthma, uncomplicated: Secondary | ICD-10-CM | POA: Insufficient documentation

## 2021-04-03 DIAGNOSIS — W228XXA Striking against or struck by other objects, initial encounter: Secondary | ICD-10-CM | POA: Insufficient documentation

## 2021-04-03 DIAGNOSIS — R0781 Pleurodynia: Secondary | ICD-10-CM | POA: Insufficient documentation

## 2021-04-03 DIAGNOSIS — S39012A Strain of muscle, fascia and tendon of lower back, initial encounter: Secondary | ICD-10-CM | POA: Insufficient documentation

## 2021-04-03 DIAGNOSIS — Z8541 Personal history of malignant neoplasm of cervix uteri: Secondary | ICD-10-CM | POA: Insufficient documentation

## 2021-04-03 DIAGNOSIS — R1084 Generalized abdominal pain: Secondary | ICD-10-CM | POA: Insufficient documentation

## 2021-04-03 DIAGNOSIS — F17210 Nicotine dependence, cigarettes, uncomplicated: Secondary | ICD-10-CM | POA: Insufficient documentation

## 2021-04-03 DIAGNOSIS — Y990 Civilian activity done for income or pay: Secondary | ICD-10-CM | POA: Insufficient documentation

## 2021-04-03 LAB — URINALYSIS, ROUTINE W REFLEX MICROSCOPIC
Bilirubin Urine: NEGATIVE
Glucose, UA: NEGATIVE mg/dL
Hgb urine dipstick: NEGATIVE
Ketones, ur: NEGATIVE mg/dL
Leukocytes,Ua: NEGATIVE
Nitrite: NEGATIVE
Protein, ur: NEGATIVE mg/dL
Specific Gravity, Urine: 1.01 (ref 1.005–1.030)
pH: 5 (ref 5.0–8.0)

## 2021-04-03 LAB — PREGNANCY, URINE: Preg Test, Ur: NEGATIVE

## 2021-04-03 MED ORDER — HYDROMORPHONE HCL 1 MG/ML IJ SOLN
0.5000 mg | Freq: Once | INTRAMUSCULAR | Status: AC
  Administered 2021-04-03: 0.5 mg via INTRAVENOUS
  Filled 2021-04-03: qty 1

## 2021-04-03 NOTE — ED Provider Notes (Signed)
[facility] Emergency Department Provider Note MRN:  [id number]  Arrival date & time: 04/04/21     Chief Complaint   Back pain and abdominal pain History of Present Illness   Marissa Frank is a 50 y.o. year-old female with no pertinent past medical history presenting to the ED with chief complaint of back pain and abdominal pain.  Location: Diffuse flanks, lower back, abdomen Duration: Several hours Onset: Gradual Timing: Constant, progressively worsening Description: Ache Severity: Severe Exacerbating/Alleviating Factors: Worse with movement and certain positions Associated Symptoms: Rib pain Pertinent Negatives: Denies fever, no bowel or bladder dysfunction, no dysuria, no hematuria, no numbness or weakness to the arms or legs  Additional History: Hit her ribs on a metal bar at work on the left side  Review of Systems  A complete 10 system review of systems was obtained and all systems are negative except as noted in the HPI and PMH.   Patient's Health History    Past Medical History:  Diagnosis Date   Asthma    Cancer [facility])    cervical   Depression    Hemorrhoids     Past Surgical History:  Procedure Laterality Date   BIOPSY  04/24/2018   Procedure: BIOPSY;  Surgeon: Daneil Dolin, MD;  [facility];  Service: Endoscopy;;   BIOPSY  01/20/2021   Procedure: BIOPSY;  Surgeon: Daneil Dolin, MD;  [facility];  Service: Endoscopy;;  gastric   BREAST SURGERY     benign tumor   CERVICAL CONE BIOPSY     COLONOSCOPY WITH PROPOFOL N/A 01/20/2021   Procedure: COLONOSCOPY WITH PROPOFOL;  Surgeon: Daneil Dolin, MD;  [facility];  Service: Endoscopy;  Laterality: N/A;  7:30am   ESOPHAGOGASTRODUODENOSCOPY (EGD) WITH PROPOFOL N/A 04/24/2018   reflux esophagitis s/p dilation, small hiatal hernia, multiple small gastric ulcers s/p biopsy, reactive gastropathy, no erosions. +NSAIDs at that time.    ESOPHAGOGASTRODUODENOSCOPY (EGD) WITH PROPOFOL N/A 01/20/2021   Procedure: ESOPHAGOGASTRODUODENOSCOPY (EGD) WITH PROPOFOL;  Surgeon: Daneil Dolin, MD;  [facility];  Service: Endoscopy;  Laterality: N/A;   MALONEY DILATION N/A 04/24/2018   Procedure: Venia Minks DILATION;  Surgeon: Daneil Dolin, MD;  [facility];  Service: Endoscopy;  Laterality: N/A;   MALONEY DILATION N/A 01/20/2021   Procedure: Venia Minks DILATION;  Surgeon: Daneil Dolin, MD;  [facility];  Service: Endoscopy;  Laterality: N/A;   POLYPECTOMY  01/20/2021   Procedure: POLYPECTOMY;  Surgeon: Daneil Dolin, MD;  [facility];  Service: Endoscopy;;   TUBAL LIGATION     WISDOM TOOTH EXTRACTION      Family History  Problem Relation Age of Onset   Hypertension Mother    Stroke Mother    Colon cancer Neg Hx    Colon polyps Neg Hx    Stomach cancer Neg Hx    Liver disease Neg Hx     Social History   Socioeconomic History   Marital status: Single    Spouse name: Not on file   Number of children: Not on file   Years of education: Not on file   Highest education level: Not on file  Occupational History   Not on file  Tobacco Use   Smoking status: Every Day    Packs/day: 0.50    Types: Cigarettes   Smokeless tobacco: Never  Vaping Use   Vaping Use: Former  Substance and Sexual Activity  Alcohol use: Yes    Alcohol/week: 7.0 standard drinks    Types: 7 Glasses of wine per week    Comment: 11/16/20-no drink in 2 weeks; previously drank glass of wine before bedtime   Drug use: Not Currently    Types: Marijuana    Comment: none since teenager   Sexual activity: Yes    Birth control/protection: Surgical, Post-menopausal  Other Topics Concern   Not on file  Social History Narrative   Not on file   Social Determinants of Health   Financial Resource Strain: Not on file  Food Insecurity: Not on file  Transportation Needs: Not on file  Physical Activity: Not on file   Stress: Not on file  Social Connections: Not on file  Intimate Partner Violence: Not on file     Physical Exam   Vitals:   04/04/21 0030 04/04/21 0130  BP: 109/68 103/66  Pulse: (!) 52 (!) 55  Resp: 16 15  Temp:    SpO2: 99% 100%    CONSTITUTIONAL: Well-appearing, NAD NEURO:  Alert and oriented x 3, no focal deficits EYES:  eyes equal and reactive ENT/NECK:  no LAD, no JVD CARDIO: Regular rate, well-perfused, normal S1 and S2 PULM:  CTAB no wheezing or rhonchi GI/GU:  normal bowel sounds, non-distended, moderate diffuse tenderness MSK/SPINE:  No gross deformities, no edema SKIN:  no rash, atraumatic PSYCH:  Appropriate speech and behavior  *Additional and/or pertinent findings included in MDM below  Diagnostic and Interventional Summary    EKG Interpretation  Date/Time:    Ventricular Rate:    PR Interval:    QRS Duration:   QT Interval:    QTC Calculation:   R Axis:     Text Interpretation:         Labs Reviewed  CBC - Abnormal; Notable for the following components:      Result Value   MCV 102.0 (*)    All other components within normal limits  COMPREHENSIVE METABOLIC PANEL - Abnormal; Notable for the following components:   Potassium 3.4 (*)    AST 50 (*)    All other components within normal limits  PREGNANCY, URINE  URINALYSIS, ROUTINE W REFLEX MICROSCOPIC    CT ABDOMEN PELVIS W CONTRAST  Final Result    DG Chest 2 View  Final Result      Medications  HYDROmorphone (DILAUDID) injection 0.5 mg (0.5 mg Intravenous Given 04/03/21 2343)  iohexol (OMNIPAQUE) 350 MG/ML injection 80 mL (80 mLs Intravenous Contrast Given 04/04/21 0025)     Procedures  /  Critical Care Procedures  ED Course and Medical Decision Making  I have reviewed the triage vital signs, the nursing notes, and pertinent available records from the EMR.  Listed above are laboratory and imaging tests that I personally ordered, reviewed, and interpreted and then considered in my  medical decision making (see below for details).  Suspect MSK, no red flag symptoms to suggest myelopathy, no fever, normal vital signs.  Also considering a splenic blunt injury causing the diffuse abdominal pain.  Given the amount of tenderness on exam, will obtain CT to exclude this.     CT is normal, patient feeling better, appropriate for discharge.  Barth Kirks. Sedonia Small, MD [place] [email or web address]  Final Clinical Impressions(s) / ED Diagnoses     ICD-10-CM   1. Strain of lumbar region, initial encounter  S39.012A     2. Rib pain on left side  R07.81 DG Chest  2 View    DG Chest 2 View    3. Generalized abdominal pain  R10.84       ED Discharge Orders          Ordered    naproxen (NAPROSYN) 500 MG tablet  2 times daily        04/04/21 0159    methocarbamol (ROBAXIN) 500 MG tablet  Every 8 hours PRN        04/04/21 0159             Discharge Instructions Discussed with and Provided to Patient:     Discharge Instructions      You were evaluated in the Emergency Department and after careful evaluation, we did not find any emergent condition requiring admission or further testing in the hospital.  Your exam/testing today is overall reassuring.  Symptoms seem to be due to muscular strain or spasm.  Please use the Naprosyn anti-inflammatory for pain.  Can use the Robaxin muscle relaxer for more significant pain.  Please return to the Emergency Department if you experience any worsening of your condition.   Thank you for allowing [place] to be a part of your care.        Maudie Flakes, MD 04/04/21 [phone]

## 2021-04-03 NOTE — ED Triage Notes (Signed)
Pt. States they have been having lower back pain since Friday. Pt. States they bruised their left side of the ribs at work and than all the pain went to the back Friday.

## 2021-04-04 LAB — COMPREHENSIVE METABOLIC PANEL
ALT: 25 U/L (ref 0–44)
AST: 50 U/L — ABNORMAL HIGH (ref 15–41)
Albumin: 4.4 g/dL (ref 3.5–5.0)
Alkaline Phosphatase: 78 U/L (ref 38–126)
Anion gap: 9 (ref 5–15)
BUN: 9 mg/dL (ref 6–20)
CO2: 25 mmol/L (ref 22–32)
Calcium: 9.6 mg/dL (ref 8.9–10.3)
Chloride: 105 mmol/L (ref 98–111)
Creatinine, Ser: 0.6 mg/dL (ref 0.44–1.00)
GFR, Estimated: >60 mL/min (ref >60)
Glucose, Bld: 73 mg/dL (ref 70–99)
Potassium: 3.4 mmol/L — ABNORMAL LOW (ref 3.5–5.1)
Sodium: 139 mmol/L (ref 135–145)
Total Bilirubin: 0.3 mg/dL (ref 0.3–1.2)
Total Protein: 8 g/dL (ref 6.5–8.1)

## 2021-04-04 LAB — CBC
HCT: 41.3 % (ref 36.0–46.0)
Hemoglobin: 13.7 g/dL (ref 12.0–15.0)
MCH: 33.8 pg (ref 26.0–34.0)
MCHC: 33.2 g/dL (ref 30.0–36.0)
MCV: 102 fL — ABNORMAL HIGH (ref 80.0–100.0)
Platelets: 335 10*3/uL (ref 150–400)
RBC: 4.05 MIL/uL (ref 3.87–5.11)
RDW: 15.2 % (ref 11.5–15.5)
WBC: 5.3 10*3/uL (ref 4.0–10.5)
nRBC: 0 % (ref 0.0–0.2)

## 2021-04-04 MED ORDER — NAPROXEN 500 MG PO TABS: 500.0000 mg | ORAL_TABLET | Freq: Two times a day (BID) | ORAL | 0 refills | Status: DC

## 2021-04-04 MED ORDER — IOHEXOL 350 MG/ML SOLN
80.0000 mL | Freq: Once | INTRAVENOUS | Status: AC | PRN
  Administered 2021-04-04: 80 mL via INTRAVENOUS

## 2021-04-04 MED ORDER — METHOCARBAMOL 500 MG PO TABS: 500.0000 mg | ORAL_TABLET | Freq: Three times a day (TID) | ORAL | 0 refills | Status: DC | PRN

## 2021-04-04 NOTE — Discharge Instructions (Signed)
You were evaluated in the Emergency Department and after careful evaluation, we did not find any emergent condition requiring admission or further testing in the hospital.  Your exam/testing today is overall reassuring.  Symptoms seem to be due to muscular strain or spasm.  Please use the Naprosyn anti-inflammatory for pain.  Can use the Robaxin muscle relaxer for more significant pain.  Please return to the Emergency Department if you experience any worsening of your condition.   Thank you for allowing [place] to be a part of your care.

## 2021-04-19 ENCOUNTER — Ambulatory Visit: Payer: No Typology Code available for payment source | Admitting: Physician Assistant

## 2021-04-19 ENCOUNTER — Encounter: Payer: Self-pay | Admitting: Physician Assistant

## 2021-04-19 ENCOUNTER — Other Ambulatory Visit: Payer: Self-pay

## 2021-04-19 VITALS — BP 119/71 | HR 65 | Temp 98.0°F | Wt 98.0 lb

## 2021-04-19 DIAGNOSIS — F17200 Nicotine dependence, unspecified, uncomplicated: Secondary | ICD-10-CM

## 2021-04-19 MED ORDER — ALBUTEROL SULFATE (2.5 MG/3ML) 0.083% IN NEBU: 2.5000 mg | INHALATION_SOLUTION | Freq: Four times a day (QID) | RESPIRATORY_TRACT | 6 refills | Status: DC | PRN

## 2021-04-19 NOTE — Progress Notes (Signed)
BP 119/71   Pulse 65   Temp 98 F (36.7 C)   Wt 98 lb (44.5 kg)   LMP 06/29/2018   SpO2 97%   BMI 18.52 kg/m    Subjective:    Patient ID: Marissa Frank, female    DOB: 05/25/1971, 50 y.o.   MRN: [id number]  HPI: Marissa Frank is a 50 y.o. female presenting on 04/19/2021 for Follow-up   HPI  Pt had a negative covid 19 screening questionnaire.   Pt is 32yoF who had New pt appt 10/04/20 .  Pt cancelled her April follow up appt and is just now returning to office.   She is not going anywhere for MH.  She was going to [facility] but she doesn't like the doctor there.    She says she is no longer having any thoughts of hurting hurself.  She says she will still occasionally feel depressed but then it goes away.  Pt just started working at [employer].   She will get insurance in 3 months.  Pt was seen in ER recently for lumbar strain.  She says she didn't injure herself but that the cctv camera at work saw her stretching and told her she had to go to ER for evaluation.  Pt requests refill on albuterol for nebulizer that she uses occasionally.  She continues to smoke but says she has cut back to where a pack will last for 3 days.     Relevant past medical, surgical, family and social history reviewed and updated as indicated. Interim medical history since our last visit reviewed. Allergies and medications reviewed and updated.   Current Outpatient Medications:    albuterol (PROVENTIL) (2.5 MG/3ML) 0.083% nebulizer solution, Take 2.5 mg by nebulization every 6 (six) hours as needed for wheezing or shortness of breath., Disp: , Rfl:    aspirin 325 MG tablet, Take 325 mg by mouth daily. , Disp: , Rfl:    ferrous sulfate 325 (65 FE) MG tablet, Take 325 mg by mouth daily as needed (feeling weak)., Disp: , Rfl:    methocarbamol (ROBAXIN) 500 MG tablet, Take 1 tablet (500 mg total) by mouth every 8 (eight) hours as needed for muscle spasms., Disp: 30 tablet, Rfl: 0   naproxen  (NAPROSYN) 500 MG tablet, Take 1 tablet (500 mg total) by mouth 2 (two) times daily., Disp: 30 tablet, Rfl: 0   ibuprofen (ADVIL) 200 MG tablet, Take 400 mg by mouth every 8 (eight) hours as needed for moderate pain. (Patient not taking: Reported on 04/19/2021), Disp: , Rfl:    linaclotide (LINZESS) 290 MCG CAPS capsule, Take 290 mcg by mouth daily before breakfast. (Patient not taking: Reported on 04/19/2021), Disp: , Rfl:    polyethylene glycol-electrolytes (NULYTELY) 420 g solution, As directed (Patient not taking: Reported on 04/19/2021), Disp: 4000 mL, Rfl: 0    Review of Systems  Per HPI unless specifically indicated above     Objective:    BP 119/71   Pulse 65   Temp 98 F (36.7 C)   Wt 98 lb (44.5 kg)   LMP 06/29/2018   SpO2 97%   BMI 18.52 kg/m   Wt Readings from Last 3 Encounters:  04/19/21 98 lb (44.5 kg)  04/03/21 97 lb (44 kg)  02/18/21 96 lb 1.6 oz (43.6 kg)    Physical Exam Vitals reviewed.  Constitutional:      General: She is not in acute distress.    Appearance: She is well-developed. She is  not toxic-appearing.  HENT:     Head: Normocephalic and atraumatic.  Cardiovascular:     Rate and Rhythm: Normal rate and regular rhythm.     Pulses:          Dorsalis pedis pulses are 2+ on the right side and 2+ on the left side.  Pulmonary:     Effort: Pulmonary effort is normal.     Breath sounds: Normal breath sounds.  Abdominal:     General: Bowel sounds are normal.     Palpations: Abdomen is soft. There is no mass.     Tenderness: There is no abdominal tenderness.  Musculoskeletal:     Cervical back: Neck supple.     Thoracic back: No tenderness.     Lumbar back: No tenderness or bony tenderness. Normal range of motion. Negative right straight leg raise test and negative left straight leg raise test.     Right lower leg: No edema.     Left lower leg: No edema.  Lymphadenopathy:     Cervical: No cervical adenopathy.  Skin:    General: Skin is warm and  dry.  Neurological:     Mental Status: She is alert and oriented to person, place, and time.     Motor: No weakness or tremor.     Gait: Gait is intact. Gait normal.     Deep Tendon Reflexes:     Reflex Scores:      Patellar reflexes are 2+ on the right side and 2+ on the left side. Psychiatric:        Attention and Perception: Attention normal.        Speech: Speech normal.        Behavior: Behavior normal. Behavior is cooperative.           Assessment & Plan:    Encounter Diagnosis  Name Primary?   Tobacco use disorder Yes       -Reviewed labs with pt (done in April) -Mammogram UTD -Colonoscopy/EGD done in June- polyps and hemorrhoids -Counseled smoking cessation.  Refills for albuterol sent to pharmacy of her choice -pt was encouraged to get covid booster -pt to follow up in 6 months.  She is to contact office sooner prn.  She is to notify office if she gets her insurance as planned

## 2021-05-04 ENCOUNTER — Encounter: Payer: Self-pay | Admitting: Physician Assistant

## 2021-05-04 ENCOUNTER — Ambulatory Visit: Payer: No Typology Code available for payment source | Admitting: Physician Assistant

## 2021-05-04 ENCOUNTER — Other Ambulatory Visit: Payer: Self-pay

## 2021-05-04 VITALS — BP 111/63 | HR 51 | Temp 98.6°F | Wt 99.0 lb

## 2021-05-04 DIAGNOSIS — N6459 Other signs and symptoms in breast: Secondary | ICD-10-CM

## 2021-05-04 NOTE — Progress Notes (Signed)
   LMP 06/29/2018    Subjective:    Patient ID: Marissa Frank, female    DOB: 1971-02-16, 50 y.o.   MRN: [id number]  HPI: Marissa Frank is a 50 y.o. female presenting on 05/04/2021 for No chief complaint on file.   HPI  Breast started bleeding from the nipple.  This started yesterday.  It is sore.   Pt had normal screening mammogram in April.   Relevant past medical, surgical, family and social history reviewed and updated as indicated. Interim medical history since our last visit reviewed. Allergies and medications reviewed and updated.  Review of Systems  Per HPI unless specifically indicated above     Objective:    LMP 06/29/2018   Wt Readings from Last 3 Encounters:  04/19/21 98 lb (44.5 kg)  04/03/21 97 lb (44 kg)  02/18/21 96 lb 1.6 oz (43.6 kg)    Physical Exam Constitutional:      General: She is not in acute distress.    Appearance: She is not toxic-appearing.  HENT:     Head: Normocephalic and atraumatic.  Pulmonary:     Effort: No respiratory distress.  Chest:  Breasts:    Right: Normal.     Left: Normal. No bleeding, mass or nipple discharge.  Neurological:     Mental Status: She is alert and oriented to person, place, and time.  Psychiatric:        Behavior: Behavior normal.         Assessment & Plan:     Encounter Diagnosis  Name Primary?   Bleeding from nipple in female Yes   rEfer for Diagnostic mammogram

## 2021-05-08 ENCOUNTER — Other Ambulatory Visit (HOSPITAL_COMMUNITY): Payer: Self-pay | Admitting: Physician Assistant

## 2021-05-08 DIAGNOSIS — R928 Other abnormal and inconclusive findings on diagnostic imaging of breast: Secondary | ICD-10-CM

## 2021-05-23 ENCOUNTER — Telehealth: Payer: Self-pay

## 2021-05-23 ENCOUNTER — Ambulatory Visit (INDEPENDENT_AMBULATORY_CARE_PROVIDER_SITE_OTHER): Payer: Self-pay | Admitting: Gastroenterology

## 2021-05-23 ENCOUNTER — Encounter: Payer: Self-pay | Admitting: Internal Medicine

## 2021-05-23 ENCOUNTER — Encounter: Payer: Self-pay | Admitting: Gastroenterology

## 2021-05-23 ENCOUNTER — Other Ambulatory Visit: Payer: Self-pay

## 2021-05-23 VITALS — BP 106/67 | HR 63 | Temp 97.5°F | Ht 62.0 in | Wt 98.8 lb

## 2021-05-23 DIAGNOSIS — K5900 Constipation, unspecified: Secondary | ICD-10-CM

## 2021-05-23 MED ORDER — OMEPRAZOLE 40 MG PO CPDR: 40.0000 mg | DELAYED_RELEASE_CAPSULE | Freq: Every day | ORAL | 3 refills | Status: DC

## 2021-05-23 NOTE — Progress Notes (Signed)
Referring Provider: Soyla Dryer, PA-C Primary Care Physician:  Soyla Dryer, PA-C Primary GI: Dr. Gala Romney   Chief Complaint  Patient presents with   Abdominal Pain    Across lower abd   Constipation    Has been out of Linzess for several months. Last bm 3 days ago-hard. No bleeding lately    HPI:   Marissa Frank is a 50 y.o. female presenting today with a history of PUD in 2019 s/p EGD recently with antral erosions, no persistent ulcerative disease. Colonoscopy in interim from last visit completed. 10 year screening recommended. Prescribed Linzess at last visit for constipation. Here for follow-up.  Notes linzess worked well. She has been out of this for several months. Notes lower abdominal pain that is relieved after good BM. She continues on aspirin 325 mg, which she takes on her own. I have asked her to stop this. She is on omeprazole 40 mg daily, which is controlling symptoms.    Past Medical History:  Diagnosis Date   Asthma    Cancer [facility])    cervical   Depression    Hemorrhoids     Past Surgical History:  Procedure Laterality Date   BIOPSY  04/24/2018   Procedure: BIOPSY;  Surgeon: Daneil Dolin, MD;  [facility];  Service: Endoscopy;;   BIOPSY  01/20/2021   Procedure: BIOPSY;  Surgeon: Daneil Dolin, MD;  [facility];  Service: Endoscopy;;  gastric   BREAST SURGERY     benign tumor   CERVICAL CONE BIOPSY     COLONOSCOPY WITH PROPOFOL N/A 01/20/2021   hemorrhoids, one 5 mm polyp in sigmoid colon, simgoid and descending colon diverticulosis. leiomyoma   ESOPHAGOGASTRODUODENOSCOPY (EGD) WITH PROPOFOL N/A 04/24/2018   reflux esophagitis s/p dilation, small hiatal hernia, multiple small gastric ulcers s/p biopsy, reactive gastropathy, no erosions. +NSAIDs at that time.   ESOPHAGOGASTRODUODENOSCOPY (EGD) WITH PROPOFOL N/A 01/20/2021   normal esophagus s/p dilation, small hiatal hernia, antral erosions s/p biopsy. Normal  duodenum.   MALONEY DILATION N/A 04/24/2018   Procedure: Venia Minks DILATION;  Surgeon: Daneil Dolin, MD;  [facility];  Service: Endoscopy;  Laterality: N/A;   MALONEY DILATION N/A 01/20/2021   Procedure: Venia Minks DILATION;  Surgeon: Daneil Dolin, MD;  [facility];  Service: Endoscopy;  Laterality: N/A;   POLYPECTOMY  01/20/2021   Procedure: POLYPECTOMY;  Surgeon: Daneil Dolin, MD;  [facility];  Service: Endoscopy;;   TUBAL LIGATION     WISDOM TOOTH EXTRACTION      Current Outpatient Medications  Medication Sig Dispense Refill   albuterol (PROVENTIL) (2.5 MG/3ML) 0.083% nebulizer solution Take 3 mLs (2.5 mg total) by nebulization every 6 (six) hours as needed for wheezing or shortness of breath. 75 mL 6   aspirin 325 MG tablet Take 325 mg by mouth daily.      ferrous sulfate 325 (65 FE) MG tablet Take 325 mg by mouth daily as needed (feeling weak).     ibuprofen (ADVIL) 200 MG tablet Take 400 mg by mouth every 8 (eight) hours as needed for moderate pain.     methocarbamol (ROBAXIN) 500 MG tablet Take 1 tablet (500 mg total) by mouth every 8 (eight) hours as needed for muscle spasms. 30 tablet 0   omeprazole (PRILOSEC) 40 MG capsule Take 1 capsule (40 mg total) by mouth daily. 30 minutes before breakfast 90 capsule 3   No current facility-administered medications  for this visit.    Allergies as of 05/23/2021 - Review Complete 05/23/2021  Allergen Reaction Noted   Almond oil Itching 03/30/2011   Carrot [daucus carota] Itching 03/16/2015   Celery oil Itching 03/16/2015   Coconut flavor Nausea And Vomiting and Other (See Comments) 03/30/2011   Macadamia nut oil Itching 03/30/2011    Family History  Problem Relation Age of Onset   Hypertension Mother    Stroke Mother    Colon cancer Neg Hx    Colon polyps Neg Hx    Stomach cancer Neg Hx    Liver disease Neg Hx     Social History   Socioeconomic History   Marital status: Single     Spouse name: Not on file   Number of children: Not on file   Years of education: Not on file   Highest education level: Not on file  Occupational History   Not on file  Tobacco Use   Smoking status: Every Day    Packs/day: 0.50    Types: Cigarettes   Smokeless tobacco: Never  Vaping Use   Vaping Use: Former  Substance and Sexual Activity   Alcohol use: Yes    Alcohol/week: 7.0 standard drinks    Types: 7 Glasses of wine per week    Comment: 05/23/21-glass of wine nightly   Drug use: Not Currently    Types: Marijuana    Comment: none since teenager   Sexual activity: Yes    Birth control/protection: Surgical, Post-menopausal  Other Topics Concern   Not on file  Social History Narrative   Not on file   Social Determinants of Health   Financial Resource Strain: Not on file  Food Insecurity: Not on file  Transportation Needs: Not on file  Physical Activity: Not on file  Stress: Not on file  Social Connections: Not on file    Review of Systems: Gen: Denies fever, chills, anorexia. Denies fatigue, weakness, weight loss.  CV: Denies chest pain, palpitations, syncope, peripheral edema, and claudication. Resp: Denies dyspnea at rest, cough, wheezing, coughing up blood, and pleurisy. GI: see HPI Derm: Denies rash, itching, dry skin Psych: Denies depression, anxiety, memory loss, confusion. No homicidal or suicidal ideation.  Heme: Denies bruising, bleeding, and enlarged lymph nodes.  Physical Exam: BP 106/67   Pulse 63   Temp (!) 97.5 F (36.4 C) (Temporal)   Ht 5\' 2"  (1.575 m)   Wt 98 lb 12.8 oz (44.8 kg)   LMP 06/29/2018   BMI 18.07 kg/m  General:   Alert and oriented. No distress noted. Pleasant and cooperative.  Head:  Normocephalic and atraumatic. Eyes:  Conjuctiva clear without scleral icterus. Mouth:  mask in place Abdomen:  +BS, soft, non-tender and non-distended. No rebound or guarding. No HSM or masses noted. Msk:  Symmetrical without gross deformities.  Normal posture. Extremities:  Without edema. Neurologic:  Alert and  oriented x4 Psych:  Alert and cooperative. Normal mood and affect.  ASSESSMENT/PLAN: Marissa Frank is a 50 y.o. female presenting today with a history of PUD in 2019 s/p EGD recently with antral erosions, no persistent ulcerative disease. Colonoscopy in interim from last visit completed. 10 year screening recommended. Chronic constipation and GERD. Here for follow-up.  GERD: well-controlled on omeprazole daily.   Constipation: unfortunately has been out of Linzess for several months. I have provided samples and will need to get patient assistance as she does not have insurance. Forms provided today.   Return in 3 months. Call if no  improvement after starting Linzess again.   I have also asked her to stop aspirin 325 mg daily, which she has been taking on her own.    Annitta Needs, PhD, ANP-BC [facility]  Prescribed Linzess at last visit for constipation. Here for follow-up.

## 2021-05-23 NOTE — Telephone Encounter (Signed)
noted 

## 2021-05-23 NOTE — Telephone Encounter (Signed)
Marissa Frank said pt can do a 6 month follow-up instead of 3

## 2021-05-23 NOTE — Patient Instructions (Signed)
I recommend stopping aspirin 325 mg daily.   Continue omeprazole daily, at least 30 minutes before breakfast.  I have given samples of Linzess you can take every day. We will work on patient assistance once you fill the forms out.  We will see you in 3 months!  I enjoyed seeing you again today! As you know, I value our relationship and want to provide genuine, compassionate, and quality care. I welcome your feedback. If you receive a survey regarding your visit,  I greatly appreciate you taking time to fill this out. See you next time!  Annitta Needs, PhD, ANP-BC [facility]

## 2021-05-24 ENCOUNTER — Ambulatory Visit (HOSPITAL_COMMUNITY)
Admission: RE | Admit: 2021-05-24 | Discharge: 2021-05-24 | Disposition: A | Discharge status: Home / Self Care | Payer: Self-pay | Source: Ambulatory Visit | Attending: Physician Assistant | Admitting: Physician Assistant

## 2021-05-24 DIAGNOSIS — R928 Other abnormal and inconclusive findings on diagnostic imaging of breast: Secondary | ICD-10-CM

## 2021-05-25 ENCOUNTER — Telehealth: Payer: Self-pay

## 2021-05-25 NOTE — Telephone Encounter (Signed)
Inbasket to [employer] sent regarding referral to [facility], requested demographics & office notes sent on pt behalf, info faxed to [place].

## 2021-05-26 ENCOUNTER — Encounter: Payer: Self-pay | Admitting: Gastroenterology

## 2021-06-02 ENCOUNTER — Other Ambulatory Visit: Payer: Self-pay

## 2021-06-02 ENCOUNTER — Encounter (HOSPITAL_COMMUNITY): Payer: Self-pay

## 2021-06-02 ENCOUNTER — Other Ambulatory Visit (HOSPITAL_COMMUNITY)
Admission: RE | Admit: 2021-06-02 | Discharge: 2021-06-02 | Disposition: A | Discharge status: Home / Self Care | Payer: Self-pay | Source: Ambulatory Visit | Attending: Obstetrics and Gynecology | Admitting: Obstetrics and Gynecology

## 2021-06-02 ENCOUNTER — Inpatient Hospital Stay (HOSPITAL_COMMUNITY): Payer: Self-pay | Attending: Obstetrics and Gynecology | Admitting: *Deleted

## 2021-06-02 VITALS — BP 108/86 | Wt 97.6 lb

## 2021-06-02 DIAGNOSIS — Z01419 Encounter for gynecological examination (general) (routine) without abnormal findings: Secondary | ICD-10-CM

## 2021-06-02 DIAGNOSIS — N6452 Nipple discharge: Secondary | ICD-10-CM | POA: Insufficient documentation

## 2021-06-02 NOTE — Patient Instructions (Signed)
Explained breast self awareness with Marissa Frank. Pap smear completed today. Let her know that her next Pap smear will be due in 3 years if today's Pap smear is normal due to her history of cervical cancer per patient. Referred patient to [facility] per recommendation. Appointment scheduled Thursday, June 15, 2021 at 1430. Patient aware of appointment and will be there. Let patient know will follow up with her within the next couple weeks with results of breast discharge and Pap smear by phone. Discussed smoking cessation with patient. Referred to the [facility] Quitline and gave resources to the free smoking cessation classes at [facility]. Marissa Frank verbalized understanding.  Quinetta Shilling, Arvil Chaco, RN 10:15 AM

## 2021-06-02 NOTE — Progress Notes (Signed)
[name] is a 50 y.o. G89P2010 female who presents to [facility] clinic today with complaint of left breast spontaneous bloody discharge x 4 weeks and left upper/outer breast pain x one year. Patient states the pain is constant. Patient rates the pain at a 4 out of 10.    Pap Smear: Pap smear completed today. Last Pap smear was around 5 years ago at the [facility] clinic and was normal per patient. Per patient has history of an abnormal Pap smear in the 1990's that showed cervical cancer that a CKC was completed for follow-up. Patient stated that all Pap smears have been normal since. Last Pap smear result is not available in Epic.   Physical exam: Breasts Right breast slightly larger than left breast that per patient is normal for her. No skin abnormalities bilateral breasts. No nipple retraction bilateral breasts. No nipple discharge right breast. Expressed bloody discharge from the left breast on exam. Sample of discharge sent to Cytology for evaluation. No lymphadenopathy. No lumps palpated bilateral breasts. Complaints of left upper and outer breast pain on exam that radiates to the axilla.  MS DIGITAL SCREENING TOMO BILATERAL  Result Date: 11/15/2020 CLINICAL DATA:  Screening. EXAM: DIGITAL SCREENING BILATERAL MAMMOGRAM WITH TOMOSYNTHESIS AND CAD TECHNIQUE: Bilateral screening digital craniocaudal and mediolateral oblique mammograms were obtained. Bilateral screening digital breast tomosynthesis was performed. The images were evaluated with computer-aided detection. COMPARISON:  Previous exam(s). ACR Breast Density Category c: The breast tissue is heterogeneously dense, which may obscure small masses. FINDINGS: There are no findings suspicious for malignancy. The images were evaluated with computer-aided detection. IMPRESSION: No mammographic evidence of malignancy. A result letter of this screening mammogram will be mailed directly to the patient. RECOMMENDATION:  Screening mammogram in one year. (Code:SM-B-01Y) BI-RADS CATEGORY  1: Negative. Electronically Signed   By: Evangeline Dakin M.D.   On: 11/15/2020 08:52   MS DIGITAL DIAG TOMO UNI LEFT  Result Date: 05/24/2021 CLINICAL DATA:  Bloody nipple discharge on the left 1st noticed 2-3 weeks ago when washing the breast in the shower. She has subsequently been able to express bloody discharge from the left nipple. EXAM: DIGITAL DIAGNOSTIC UNILATERAL LEFT MAMMOGRAM WITH TOMOSYNTHESIS AND CAD; ULTRASOUND LEFT BREAST LIMITED TECHNIQUE: Left digital diagnostic mammography and breast tomosynthesis was performed. The images were evaluated with computer-aided detection.; Targeted ultrasound examination of the left breast was performed. COMPARISON:  Previous exam(s). ACR Breast Density Category c: The breast tissue is heterogeneously dense, which may obscure small masses. FINDINGS: 3D tomographic and 2D generated mammographic images of the left breast demonstrate a probable dilated retroareolar duct. There are no visible masses or other findings suspicious for malignancy. A previously marked skin mole in the upper outer left breast is unchanged. On physical exam, a tiny amount of bloody discharge was elicited from a single opening in the central left nipple in the 3 o'clock position. There was no palpable mass. Targeted ultrasound is performed, showing multiple dilated retroareolar and periareolar ducts on the left. Some of these contain internal debris with no sonographically visible masses. No internal blood flow was seen in any of the ducts with power Doppler. IMPRESSION: 1. Benign left breast duct ectasia. 2. Single duct bloody nipple discharge on the left with no cause seen with mammography or ultrasound. RECOMMENDATION: Bilateral MRI of the breasts with and without contrast and surgical consultation for the bloody nipple discharge on the left. I have discussed the findings and recommendations with the patient. If  applicable,  a reminder letter will be sent to the patient regarding the next appointment. BI-RADS CATEGORY  2: Benign. Electronically Signed   By: Claudie Revering M.D.   On: 05/24/2021 16:47       Pelvic/Bimanual Ext Genitalia No lesions, no swelling and no discharge observed on external genitalia.        Vagina Vagina pink and normal texture. No lesions or discharge observed in vagina.        Cervix Cervix is present. Cervix pink and of normal texture. No discharge observed.    Uterus Uterus is present and palpable. Uterus in normal position and normal size.        Adnexae Bilateral ovaries present and palpable. No tenderness on palpation.         Rectovaginal No rectal exam completed today since patient had no rectal complaints. No skin abnormalities observed on exam.     Smoking History: Patient is a current smoker. Discussed smoking cessation with patient. Referred to the [facility] Quitline and gave resources to the free smoking cessation classes at [facility].   Patient Navigation: Patient education provided. Access to services provided for patient through [facility] program.   Colorectal Cancer Screening: Per patient has had colonoscopy completed on 01/20/2021.  No complaints today.    Breast and Cervical Cancer Risk Assessment: Patient does not have family history of breast cancer, known genetic mutations, or radiation treatment to the chest before age 75. Per patient has history of cervical dysplasia. Patient has no history of being immunocompromised or DES exposure in-utero.  Risk Assessment     Risk Scores       06/02/2021   Last edited by: Loletta Parish, RN   5-year risk: 1.2 %   Lifetime risk: 8.3 %           A: [facility]CP exam with pap smear Complaint of left breast spontaneous bloody nipple discharge and left breast pain.  P: Referred patient to [facility] per recommendation. Appointment scheduled Thursday, June 15, 2021 at  1430.  Loletta Parish, RN 06/02/2021 10:15 AM

## 2021-06-06 LAB — CYTOLOGY - PAP
Comment: NEGATIVE
Diagnosis: NEGATIVE
High risk HPV: NEGATIVE

## 2021-06-06 LAB — CYTOLOGY - NON PAP

## 2021-06-15 ENCOUNTER — Ambulatory Visit (INDEPENDENT_AMBULATORY_CARE_PROVIDER_SITE_OTHER): Payer: Self-pay | Admitting: General Surgery

## 2021-06-15 ENCOUNTER — Other Ambulatory Visit: Payer: Self-pay

## 2021-06-15 ENCOUNTER — Encounter: Payer: Self-pay | Admitting: General Surgery

## 2021-06-15 VITALS — BP 141/82 | HR 69 | Temp 98.3°F | Resp 14 | Ht <= 58 in | Wt 102.0 lb

## 2021-06-15 DIAGNOSIS — N6452 Nipple discharge: Secondary | ICD-10-CM

## 2021-06-15 NOTE — Patient Instructions (Signed)
Will get a duct excision of the left breast approved by [facility] program.  This is much like an excisional breast biopsy.  What are the treatments for breast pain? Use less salt. Wear a supportive bra. Apply local heat to the painful area. Take over-the-counter pain relievers sparingly, as needed. Avoid caffeine. Well-designed studies have not shown that avoiding caffeine can treat breast pain. However, many women report significant improvement in their symptoms when they reduce their intake of tea, coffee, chocolate, and energy drinks. Try Vitamin E. Studies have not consistently shown benefits of vitamin E for treating breast pain, though some women find it helpful. Using vitamin E for a few weeks to see if it will help is unlikely to cause any harm. However, long-term use of vitamin E supplementation is not recommended for breast pain, as there are some studies suggesting this may not be safe. Try evening primrose oil. Similar to vitamin E, studies have not consistently shown evening primrose oil to be helpful in treating breast pain, though it does help some women. Evening primrose oil is found over-the-counter. Side effects might include nausea, diarrhea, and headaches. In the past, there was concern that certain patients might be at increased risk of seizures when taking this supplement, though this is now disputed. Try Omega-3 fatty acid. Though not proven to be effective in rigorous studies, some women find increased intake of fish oils/omega-3 supplements to be helpful. Natural dietary sources include: dark green leafy vegetables, ocean-raised ("wild") cold-water fish, flax, walnuts, and sesame. Omega-3 supplements are also available by prescription and over-the-counter. Give it time. Most commonly, pain goes away on its own after a few months, without the need for any treatment.   Breast Biopsy A breast biopsy is a procedure in which a sample of breast tissue is removed from the breast and  examined under a microscope to see if cancerous cells are present. You may need a breast biopsy if you have: Any undiagnosed breast mass (tumor). Nipple abnormalities, dimpling, crusting, or ulcerations. Abnormal discharge from the nipple, especially blood. Redness, swelling, and pain of the breast. Calcium deposits (calcifications) or abnormalities seen on a mammogram, ultrasound results, or MRI results. Abnormal changes in the breast seen on your mammogram. If the breast abnormality is found to be cancerous (malignant), a breast biopsy can help to determine what the best treatment is for you. There are many different types of breast biopsies. Talk with your health care provider about your options and which type is best for you. Tell a health care provider about: Any allergies you have. All medicines you are taking, including vitamins, herbs, eye drops, creams, and over-the-counter medicines. Any problems you or family members have had with anesthetic medicines. Any blood disorders you have. Any surgeries you have had. Any medical conditions you have. Whether you are pregnant or may be pregnant. What are the risks? Generally, this is a safe procedure. However, problems may occur, including: Discomfort. This is temporary. Bruising and swelling of the breast. Changes in the shape of the breast. Bleeding. Infection. Damage to other tissues. Allergic reactions to medicines. Needing more surgery. What happens before the procedure? Medicines Ask your health care provider about: Changing or stopping your regular medicines. This is especially important if you are taking diabetes medicines or blood thinners. Taking medicines such as aspirin and ibuprofen. These medicines can thin your blood. Do not take these medicines unless your health care provider tells you to take them. Taking over-the-counter medicines, vitamins, herbs, and supplements. Lifestyle  Do not use any products that contain  nicotine or tobacco, such as cigarettes, e-cigarettes, and chewing tobacco. If you need help quitting, ask your health care provider. Do not drink alcohol for 24 hours before the procedure. Wear a good support bra to the procedure. Eating and drinking restrictions Talk to your health care provider about when you should stop eating and drinking. You may be asked not to drink or eat for 2-8 hours before the breast biopsy. In some cases, you may be allowed to eat a light breakfast. General instructions Plan to have someone take you home from the hospital or clinic. Ask your health care provider how your surgical site will be marked or identified. Ask your health care provider what steps will be taken to help prevent infection. These may include: Removing hair at the surgery site. Washing skin with a germ-killing soap. Your health care provider may do a procedure to locate and mark the tumor area in your breast (localization). This will help guide your surgeon to where the biopsy or incision is made. This may be done with: Imaging, such as a mammogram, ultrasound, or MRI. Insertion of special wire, clip, seed, or radar reflector implant in the tumor area. What happens during the procedure?  You may be given one or more of the following: A medicine to numb the breast area (local anesthetic). A medicine to help you relax (sedative). A medicine to make you fall asleep (general anesthetic). Your health care provider will perform the biopsy using only one of the following methods. He or she will do: Fine needle aspiration. A thin needle with a syringe will be inserted into a breast cyst. Fluid and cells will be removed. Core needle biopsy. A wide, hollow needle (core needle) will be inserted into a breast lump multiple times to remove tissue samples or cores. Stereotactic biopsy. X-rays and a computer will be used to locate the breast lump. The surgeon will use the X-ray images to collect several  samples of tissue using a needle. Vacuum-assisted biopsy. A small incision will be made in your breast. A hollow needle and vacuum will be passed through the incision and into the breast tissue. The vacuum will gently draw abnormal breast tissue into the needle to remove it. Ultrasound-guided core needle biopsy. An ultrasound will be used to help guide the core needle to the area of the mass or abnormality. An incision will be made to insert the needle. Then tissue samples will be removed. Surgical biopsy. An incision will be made in the breast to remove part or all of the abnormal tissue. After the tissue is removed, the skin over the area will be closed with sutures and covered with a dressing. There are two types of surgical biopsies: Incisional biopsy. The surgeon will remove part of the breast lump. Excisional biopsy. The surgeon will attempt to remove the whole breast lump or as much of it as possible. After any of these procedures, the tissue or fluid that was removed will be examined under a microscope. The procedure may vary among health care providers and hospitals. What happens after the procedure? You will be taken to the recovery area. If you are doing well and have no problems, you will be allowed to go home. You may have a pressure dressing applied on your breast for 24-48 hours. You may also be advised to wear a supportive bra during this time. Do not drive for 24 hours if you were given a sedative during your procedure.  Summary A breast biopsy is a procedure in which a sample of breast tissue is removed from the breast and examined under a microscope to see if cancerous cells are present. This is a safe procedure, but problems can occur, including bleeding, infection, pain, and bruising. Ask your health care provider about changing or stopping your regular medicines. Plan to have someone take you home from the hospital or clinic. This information is not intended to replace advice  given to you by your health care provider. Make sure you discuss any questions you have with your health care provider. Document Revised: 03/28/2020 Document Reviewed: 01/01/2018 Elsevier Patient Education  [place].

## 2021-06-15 NOTE — Progress Notes (Signed)
[facility] History and Physical  Reason for Referral: Left nipple discharge  Referring Physician: Soyla Dryer, PA-C   Chief Complaint   New Patient (Initial Visit)     Marissa Frank is a 50 y.o. female.  HPI: Marissa Frank is a 50 yo who has few medical issues and takes few chronic medications and had a mammogram after having some left nipple bloody discharge. She has noticed spontaneous discharge in the shower that was bloody when rubbing her breast for cleaning. She has had soreness in the left breast and arm and under the axilla.   The patient had a right breast mass that was benign removed when she was 16.  She had menarche at age 15, and her first pregnancy at age 81.  She has no history of any family breast cancer.  She had no prior abno\rmal mammograms.   She has not had any chest radiation.\  She continues to have intermittent left breast discharge and it comes from a single duct. The mammogram and ultrasound were benign.  Past Medical History:  Diagnosis Date   Asthma    Cancer [facility])    cervical   Depression    Hemorrhoids     Past Surgical History:  Procedure Laterality Date   BIOPSY  04/24/2018   Procedure: BIOPSY;  Surgeon: Daneil Dolin, MD;  Location: [facility];  Service: Endoscopy;;   BIOPSY  01/20/2021   Procedure: BIOPSY;  Surgeon: Daneil Dolin, MD;  Location: [facility];  Service: Endoscopy;;  gastric   BREAST SURGERY     benign tumor   CERVICAL CONE BIOPSY     COLONOSCOPY WITH PROPOFOL N/A 01/20/2021   hemorrhoids, one 5 mm polyp in sigmoid colon, simgoid and descending colon diverticulosis. leiomyoma   ESOPHAGOGASTRODUODENOSCOPY (EGD) WITH PROPOFOL N/A 04/24/2018   reflux esophagitis s/p dilation, small hiatal hernia, multiple small gastric ulcers s/p biopsy, reactive gastropathy, no erosions. +NSAIDs at that time.   ESOPHAGOGASTRODUODENOSCOPY (EGD) WITH PROPOFOL N/A 01/20/2021   normal esophagus s/p dilation,  small hiatal hernia, antral erosions s/p biopsy. Normal duodenum.   MALONEY DILATION N/A 04/24/2018   Procedure: Venia Minks DILATION;  Surgeon: Daneil Dolin, MD;  [facility];  Service: Endoscopy;  Laterality: N/A;   MALONEY DILATION N/A 01/20/2021   Procedure: Venia Minks DILATION;  Surgeon: Daneil Dolin, MD;  [facility];  Service: Endoscopy;  Laterality: N/A;   POLYPECTOMY  01/20/2021   Procedure: POLYPECTOMY;  Surgeon: Daneil Dolin, MD;  [facility];  Service: Endoscopy;;   TUBAL LIGATION     WISDOM TOOTH EXTRACTION      Family History  Problem Relation Age of Onset   Hypertension Mother    Stroke Mother    Colon cancer Neg Hx    Colon polyps Neg Hx    Stomach cancer Neg Hx    Liver disease Neg Hx     Social History   Tobacco Use   Smoking status: Every Day    Packs/day: 0.50    Types: Cigarettes   Smokeless tobacco: Never  Vaping Use   Vaping Use: Former  Substance Use Topics   Alcohol use: Yes    Alcohol/week: 7.0 standard drinks    Types: 7 Glasses of wine per week    Comment: 05/23/21-glass of wine nightly   Drug use: Not Currently    Types: Marijuana    Comment: none since teenager    Medications: I have reviewed the patient's  current medications. Allergies as of 06/15/2021       Reactions   Almond Oil Itching   Carrot [daucus [place]ota] Itching   Celery Oil Itching   Coconut Flavor Nausea And Vomiting, Other (See Comments)   Itchy throat   Macadamia Nut Oil Itching        Medication List        Accurate as of June 15, 2021  2:52 PM. If you have any questions, ask your nurse or doctor.          STOP taking these medications    ibuprofen 200 MG tablet Commonly known as: ADVIL Stopped by: Virl Cagey, MD   methocarbamol 500 MG tablet Commonly known as: ROBAXIN Stopped by: Virl Cagey, MD       TAKE these medications    albuterol (2.5 MG/3ML) 0.083% nebulizer solution Commonly  known as: PROVENTIL Take 3 mLs (2.5 mg total) by nebulization every 6 (six) hours as needed for wheezing or shortness of breath.   ferrous sulfate 325 (65 FE) MG tablet Take 325 mg by mouth daily as needed (feeling weak).   omeprazole 40 MG capsule Commonly known as: PRILOSEC Take 1 capsule (40 mg total) by mouth daily. 30 minutes before breakfast         ROS:  A comprehensive review of systems was negative except for: Constitutional: positive for headaches Respiratory: positive for SOB Gastrointestinal: positive for abdominal pain, nausea, and reflux symptoms Musculoskeletal: positive for back pain, neck pain, and joint pain Neurological: positive for dizziness  Blood pressure (!) 141/82, pulse 69, temperature 98.3 F (36.8 C), temperature source Other (Comment), resp. rate 14, height 1' (0.305 m), weight 102 lb (46.3 kg), last menstrual period 06/29/2018, head circumference 62" (157.5 cm), SpO2 98 %. Physical Exam Vitals reviewed.  Constitutional:      Appearance: Normal appearance.  HENT:     Head: Normocephalic.     Nose: Nose normal.  Eyes:     Extraocular Movements: Extraocular movements intact.  Cardiovascular:     Rate and Rhythm: Normal rate and regular rhythm.  Pulmonary:     Effort: Pulmonary effort is normal.     Breath sounds: Normal breath sounds.  Chest:  Breasts:    Right: No inverted nipple, mass, nipple discharge, skin change or tenderness.     Left: Nipple discharge and tenderness present. No inverted nipple, mass or skin change.     Comments: 3 oclock area Single duct with brownish bloody drainage Abdominal:     General: There is no distension.     Palpations: Abdomen is soft.     Tenderness: There is no abdominal tenderness.  Musculoskeletal:        General: Normal range of motion.     Cervical back: Normal range of motion.  Skin:    General: Skin is warm.  Neurological:     General: No focal deficit present.     Mental Status: She is alert  and oriented to person, place, and time.  Psychiatric:        Mood and Affect: Mood normal.        Behavior: Behavior normal.        Thought Content: Thought content normal.        Judgment: Judgment normal.    Results: CLINICAL DATA:  Bloody nipple discharge on the left 1st noticed 2-3 weeks ago when washing the breast in the shower. She has subsequently been able to express bloody discharge from the left  nipple.   EXAM: DIGITAL DIAGNOSTIC UNILATERAL LEFT MAMMOGRAM WITH TOMOSYNTHESIS AND CAD; ULTRASOUND LEFT BREAST LIMITED   TECHNIQUE: Left digital diagnostic mammography and breast tomosynthesis was performed. The images were evaluated with computer-aided detection.; Targeted ultrasound examination of the left breast was performed.   COMPARISON:  Previous exam(s).   ACR Breast Density Category c: The breast tissue is heterogeneously dense, which may obscure small masses.   FINDINGS: 3D tomographic and 2D generated mammographic images of the left breast demonstrate a probable dilated retroareolar duct. There are no visible masses or other findings suspicious for malignancy. A previously marked skin mole in the upper outer left breast is unchanged.   On physical exam, a tiny amount of bloody discharge was elicited from a single opening in the central left nipple in the 3 o'clock position. There was no palpable mass.   Targeted ultrasound is performed, showing multiple dilated retroareolar and periareolar ducts on the left. Some of these contain internal debris with no sonographically visible masses. No internal blood flow was seen in any of the ducts with power Doppler.   IMPRESSION: 1. Benign left breast duct ectasia. 2. Single duct bloody nipple discharge on the left with no cause seen with mammography or ultrasound.   RECOMMENDATION: Bilateral MRI of the breasts with and without contrast and surgical consultation for the bloody nipple discharge on the left.   I  have discussed the findings and recommendations with the patient. If applicable, a reminder letter will be sent to the patient regarding the next appointment.   BI-RADS CATEGORY  2: Benign.     Electronically Signed   By: Claudie Revering M.D.   On: 05/24/2021 16:47     Assessment & Plan:  ASUCENA GALER is a 50 y.o. female with bloody nipple discharge on the left that is expressible and spontaneous in nature. She had imaging that was negative. The bleeding causes her embarrassment. We discussed that this is likely a papilloma or ectasia and is a benign lesion but could be a cancer, discussed option of MRI versus just proceeding with surgical excision of the duct  to get rid of the symptoms and verify the pathology. Discussed risk of excision, possible need for full retro-areola excision if the duct does not express that day and risk of finding a cancer. Discussed risk of bleeding, infection and cosmetic changes.    All questions were answered to the satisfaction of the patient.   Virl Cagey 06/15/2021, 2:52 PM

## 2021-07-03 ENCOUNTER — Telehealth: Payer: Self-pay

## 2021-07-03 ENCOUNTER — Telehealth: Payer: Self-pay | Admitting: Physician Assistant

## 2021-07-03 ENCOUNTER — Telehealth: Payer: Self-pay | Admitting: *Deleted

## 2021-07-03 NOTE — Telephone Encounter (Signed)
Call from pt stating she received a letter telling her to give to her provider, informed that if letter states to bring in then she should do what is asked.

## 2021-07-03 NOTE — Telephone Encounter (Signed)
Message left by pt regarding appt with [facility], returned pt call, left vm to call back.

## 2021-07-03 NOTE — Telephone Encounter (Signed)
Pt called this morning and was confused about further treatment from surgeon (whom she saw in November).  Notes reviewed- MRI vs surgery.  Pt was told I would check her cafa.  Inquiry with financial person at [facility], I was told pt did not have active cafa.  Pt was called back and I told her she did not have active financial assistance so needed to pick up cafa application and she said she would.

## 2021-07-03 NOTE — Telephone Encounter (Signed)
Attempted to call patient to discuss Pap smear results. No one answered phone. Left voicemail for patient to call me back.

## 2021-07-05 ENCOUNTER — Telehealth: Payer: Self-pay

## 2021-07-05 NOTE — Telephone Encounter (Signed)
Patient informed negative Pap/HPV-results, next pap smear due in 5 years. Patient verbalized understanding.  

## 2021-07-26 ENCOUNTER — Telehealth: Payer: Self-pay

## 2021-07-26 NOTE — Telephone Encounter (Signed)
Spoke with pt regarding incomplete cafa, pt stated she was waiting on call from [place] who was out, number for care connect given to call to complete application. Pt also states she wants incision instead of MRI.

## 2021-07-27 ENCOUNTER — Ambulatory Visit: Payer: No Typology Code available for payment source | Admitting: Physician Assistant

## 2021-07-27 DIAGNOSIS — Z23 Encounter for immunization: Secondary | ICD-10-CM

## 2021-07-27 DIAGNOSIS — N6459 Other signs and symptoms in breast: Secondary | ICD-10-CM

## 2021-07-27 NOTE — Patient Instructions (Signed)
Influenza (Flu) Vaccine (Inactivated or Recombinant): What You Need to Know 1. Why get vaccinated? Influenza vaccine can prevent influenza (flu). Flu is a contagious disease that spreads around the [place] every year, usually between October and May. Anyone can get the flu, but it is more dangerous for some people. Infants and young children, people 65 years and older, pregnant people, and people with certain health conditions or a weakened immune system are at greatest risk of flu complications. Pneumonia, bronchitis, sinus infections, and ear infections are examples of flu-related complications. If you have a medical condition, such as heart disease, cancer, or diabetes, flu can make it worse. Flu can cause fever and chills, sore throat, muscle aches, fatigue, cough, headache, and runny or stuffy nose. Some people may have vomiting and diarrhea, though this is more common in children than adults. In an average year, thousands of people in the [place] die from flu, and many more are hospitalized. Flu vaccine prevents millions of illnesses and flu-related visits to the doctor each year. 2. Influenza vaccines CDC recommends everyone 6 months and older get vaccinated every flu season. Children 6 months through 8 years of age may need 2 doses during a single flu season. Everyone else needs only 1 dose each flu season. It takes about 2 weeks for protection to develop after vaccination. There are many flu viruses, and they are always changing. Each year a new flu vaccine is made to protect against the influenza viruses believed to be likely to cause disease in the upcoming flu season. Even when the vaccine doesn't exactly match these viruses, it may still provide some protection. Influenza vaccine does not cause flu. Influenza vaccine may be given at the same time as other vaccines. 3. Talk with your health care provider Tell your vaccination provider if the person getting the vaccine: Has had  an allergic reaction after a previous dose of influenza vaccine, or has any severe, life-threatening allergies Has ever had Guillain-Barr Syndrome (also called "GBS") In some cases, your health care provider may decide to postpone influenza vaccination until a future visit. Influenza vaccine can be administered at any time during pregnancy. People who are or will be pregnant during influenza season should receive inactivated influenza vaccine. People with minor illnesses, such as a cold, may be vaccinated. People who are moderately or severely ill should usually wait until they recover before getting influenza vaccine. Your health care provider can give you more information. 4. Risks of a vaccine reaction Soreness, redness, and swelling where the shot is given, fever, muscle aches, and headache can happen after influenza vaccination. There may be a very small increased risk of Guillain-Barr Syndrome (GBS) after inactivated influenza vaccine (the flu shot). Young children who get the flu shot along with pneumococcal vaccine (PCV13) and/or DTaP vaccine at the same time might be slightly more likely to have a seizure caused by fever. Tell your health care provider if a child who is getting flu vaccine has ever had a seizure. People sometimes faint after medical procedures, including vaccination. Tell your provider if you feel dizzy or have vision changes or ringing in the ears. As with any medicine, there is a very remote chance of a vaccine causing a severe allergic reaction, other serious injury, or death. 5. What if there is a serious problem? An allergic reaction could occur after the vaccinated person leaves the clinic. If you see signs of a severe allergic reaction (hives, swelling of the face and throat, difficulty breathing,   a fast heartbeat, dizziness, or weakness), call 9-1-1 and get the person to the nearest hospital. For other signs that concern you, call your health care provider. Adverse  reactions should be reported to the Vaccine Adverse Event Reporting System (VAERS). Your health care provider will usually file this report, or you can do it yourself. Visit the VAERS website at www.vaers.hhs.gov or call 1-800-822-7967. VAERS is only for reporting reactions, and VAERS staff members do not give medical advice. 6. The National Vaccine Injury Compensation Program The National Vaccine Injury Compensation Program (VICP) is a federal program that was created to compensate people who may have been injured by certain vaccines. Claims regarding alleged injury or death due to vaccination have a time limit for filing, which may be as short as two years. Visit the VICP website at www.hrsa.gov/vaccinecompensation or call 1-800-338-2382 to learn about the program and about filing a claim. 7. How can I learn more? Ask your health care provider. Call your local or state health department. Visit the website of the Food and Drug Administration (FDA) for vaccine package inserts and additional information at www.fda.gov/vaccines-blood-biologics/vaccines. Contact the Centers for Disease Control and Prevention (CDC): Call 1-800-232-4636 (1-800-CDC-INFO) or Visit CDC's website at www.cdc.gov/flu. Vaccine Information Statement Inactivated Influenza Vaccine (03/04/2020) This information is not intended to replace advice given to you by your health care provider. Make sure you discuss any questions you have with your health care provider. Document Revised: 04/06/2021 Document Reviewed: 04/06/2021 Elsevier Patient Education  2022 [employer] Inc.  

## 2021-07-27 NOTE — Progress Notes (Signed)
° °  LMP 06/29/2018    Subjective:    Patient ID: Marissa Frank, female    DOB: [birth year], 50 y.o.   MRN: [id number]  HPI: Marissa Frank is a 50 y.o. female presenting on 07/27/2021 for No chief complaint on file.   HPI   Pt is 40yoF who presents to get flu shot.     Pt has seen surgeon for bleeding nipple in November.  Pt says now she wants to skip the MRI and just get the surgeon to perform excision.     Relevant past medical, surgical, family and social history reviewed and updated as indicated. Interim medical history since our last visit reviewed. Allergies and medications reviewed and updated.  Review of Systems  Per HPI unless specifically indicated above     Objective:    LMP 06/29/2018   Wt Readings from Last 3 Encounters:  06/15/21 102 lb (46.3 kg)  06/02/21 97 lb 9.6 oz (44.3 kg)  05/23/21 98 lb 12.8 oz (44.8 kg)    Physical Exam Constitutional:      General: She is not in acute distress.    Appearance: Normal appearance. She is not toxic-appearing.  HENT:     Head: Normocephalic and atraumatic.  Pulmonary:     Effort: Pulmonary effort is normal. No respiratory distress.  Neurological:     Mental Status: She is alert and oriented to person, place, and time.  Psychiatric:        Attention and Perception: Attention normal.        Speech: Speech normal.        Behavior: Behavior normal. Behavior is cooperative.           Assessment & Plan:    Encounter Diagnoses  Name Primary?   Influenza vaccination administered at current visit Yes   Bleeding from nipple in female      -Influenza vaccination administered -[facility] will be notified of pt decision to proceed with surgical excision -she has submitted her cafa -pt to follow up as scheduled.  She is to contact office sooner prn

## 2021-08-02 ENCOUNTER — Telehealth: Payer: Self-pay

## 2021-08-02 NOTE — Telephone Encounter (Signed)
Called pt in reference to appt for surgery & she stated office informed her they would call her with appt once all notes received from [facility].

## 2021-08-14 ENCOUNTER — Other Ambulatory Visit (HOSPITAL_COMMUNITY): Payer: Self-pay

## 2021-08-21 NOTE — Patient Instructions (Signed)
Marissa Frank  08/21/2021     @PREFPERIOPPHARMACY @   Your procedure is scheduled on  08/25/2021.   Report to [facility] at  0800 A.M.   Call this number if you have problems the morning of surgery:  [phone]   Remember:  Do not eat or drink after midnight.      Use your nebulizer before you come and bring your rescue inhaler with you.    Take these medicines the morning of surgery with A SIP OF WATER                                                      None     Do not wear jewelry, make-up or nail polish.  Do not wear lotions, powders, or perfumes, or deodorant.  Do not shave 48 hours prior to surgery.  Men may shave face and neck.  Do not bring valuables to the hospital.  [facility] is not responsible for any belongings or valuables.  Contacts, dentures or bridgework may not be worn into surgery.  Leave your suitcase in the car.  After surgery it may be brought to your room.  For patients admitted to the hospital, discharge time will be determined by your treatment team.  Patients discharged the day of surgery will not be allowed to drive home and must have someone with them for 24 hours.    Special instructions:   DO NOT smoke tobacco or vape for 24 hours before your procedure.  Please read over the following fact sheets that you were given. Coughing and Deep Breathing, Surgical Site Infection Prevention, Anesthesia Post-op Instructions, and Care and Recovery After Surgery      Breast Biopsy, Care After These instructions give you information about caring for yourself after your procedure. Your doctor may also give you more specific instructions. Call your doctor if you have any problems or questions after your procedure. What can I expect after the procedure? After your procedure, it is common to have: Bruising on your breast. Numbness, tingling, or pain near your biopsy site. Follow these instructions at home: Medicines Take  over-the-counter and prescription medicines only as told by your doctor. Do not drive for 24 hours if you were given a medicine to help you relax (sedative) during your procedure. Do not drink alcohol while taking pain medicine. Do not drive or use heavy machinery while taking prescription pain medicine. Biopsy site care   Follow instructions from your doctor about how to take care of your cut from surgery (incision) or your puncture area. Make sure you: Wash your hands with soap and water before you change your bandage (dressing). If you cannot use soap and water, use hand sanitizer. Change your bandage as told by your doctor. Leave stitches (sutures), skin glue, or skin tape (adhesive strips) in place. They may need to stay in place for 2 weeks or longer. If tape strips get loose and curl up, you may trim the loose edges. Do not remove tape strips completely unless your doctor says it is okay. If you have stitches, keep them dry when you take a bath or a shower. Check your cut or puncture area every day for signs of infection. Check for: Redness, swelling, or pain. Fluid or blood. Warmth. Pus or a  bad smell. Protect the biopsy area. Do not let the area get bumped. Activity If you had a cut during your procedure, avoid activities that could pull your cut open. These include: Stretching. Reaching over your head. Exercise. Sports. Lifting anything that weighs more than 3 lb (1.4 kg). Return to your normal activities as told by your doctor. Ask your doctor what activities are safe for you. Managing pain, stiffness, and swelling If told, put ice on the biopsy site to relieve swelling: Put ice in a plastic bag. Place a towel between your skin and the bag. Leave the ice on for 20 minutes, 2-3 times a day. General instructions Continue your normal diet. Wear a good support bra for as long as told by your doctor. Get checked for extra fluid around your lymph nodes (lymphedema) as often as  told by your doctor. Keep all follow-up visits as told by your doctor. This is important. Contact a doctor if: You notice any of the following at the biopsy site: More redness, swelling, or pain. More fluid or blood coming from the site. The site feels warm to the touch. Pus or a bad smell coming from the site. The site breaks open after the stitches or skin tape strips have been removed. You have a rash. You have a fever. Get help right away if: You have more bleeding from the biopsy site. Get help right away if bleeding is more than a small spot. You have trouble breathing. You have red streaks around the biopsy site. Summary After your procedure, it is common to have bruising, numbness, tingling, or pain near the biopsy site. Do not drive or use heavy machinery while taking prescription pain medicine. Wear a good support bra for as long as told by your doctor. If you had a cut during your procedure, avoid activities that may pull the cut open. Ask your doctor what activities are safe for you. This information is not intended to replace advice given to you by your health care provider. Make sure you discuss any questions you have with your health care provider. Document Revised: 03/28/2020 Document Reviewed: 01/02/2018 Elsevier Patient Education  2022 [place] Anesthesia, Adult, Care After This sheet gives you information about how to care for yourself after your procedure. Your health care provider may also give you more specific instructions. If you have problems or questions, contact your health care provider. What can I expect after the procedure? After the procedure, the following side effects are common: Pain or discomfort at the IV site. Nausea. Vomiting. Sore throat. Trouble concentrating. Feeling cold or chills. Feeling weak or tired. Sleepiness and fatigue. Soreness and body aches. These side effects can affect parts of the body that were not involved in  surgery. Follow these instructions at home: For the time period you were told by your health care provider:  Rest. Do not participate in activities where you could fall or become injured. Do not drive or use machinery. Do not drink alcohol. Do not take sleeping pills or medicines that cause drowsiness. Do not make important decisions or sign legal documents. Do not take care of children on your own. Eating and drinking Follow any instructions from your health care provider about eating or drinking restrictions. When you feel hungry, start by eating small amounts of foods that are soft and easy to digest (bland), such as toast. Gradually return to your regular diet. Drink enough fluid to keep your urine pale yellow. If you vomit, rehydrate by drinking  water, juice, or clear broth. General instructions If you have sleep apnea, surgery and certain medicines can increase your risk for breathing problems. Follow instructions from your health care provider about wearing your sleep device: Anytime you are sleeping, including during daytime naps. While taking prescription pain medicines, sleeping medicines, or medicines that make you drowsy. Have a responsible adult stay with you for the time you are told. It is important to have someone help care for you until you are awake and alert. Return to your normal activities as told by your health care provider. Ask your health care provider what activities are safe for you. Take over-the-counter and prescription medicines only as told by your health care provider. If you smoke, do not smoke without supervision. Keep all follow-up visits as told by your health care provider. This is important. Contact a health care provider if: You have nausea or vomiting that does not get better with medicine. You cannot eat or drink without vomiting. You have pain that does not get better with medicine. You are unable to pass urine. You develop a skin rash. You  have a fever. You have redness around your IV site that gets worse. Get help right away if: You have difficulty breathing. You have chest pain. You have blood in your urine or stool, or you vomit blood. Summary After the procedure, it is common to have a sore throat or nausea. It is also common to feel tired. Have a responsible adult stay with you for the time you are told. It is important to have someone help care for you until you are awake and alert. When you feel hungry, start by eating small amounts of foods that are soft and easy to digest (bland), such as toast. Gradually return to your regular diet. Drink enough fluid to keep your urine pale yellow. Return to your normal activities as told by your health care provider. Ask your health care provider what activities are safe for you. This information is not intended to replace advice given to you by your health care provider. Make sure you discuss any questions you have with your health care provider. Document Revised: 03/31/2020 Document Reviewed: 10/29/2019 Elsevier Patient Education  2022 [place]. How to Use Chlorhexidine for Bathing Chlorhexidine gluconate (CHG) is a germ-killing (antiseptic) solution that is used to clean the skin. It can get rid of the bacteria that normally live on the skin and can keep them away for about 24 hours. To clean your skin with CHG, you may be given: A CHG solution to use in the shower or as part of a sponge bath. A prepackaged cloth that contains CHG. Cleaning your skin with CHG may help lower the risk for infection: While you are staying in the intensive care unit of the hospital. If you have a vascular access, such as a central line, to provide short-term or long-term access to your veins. If you have a catheter to drain urine from your bladder. If you are on a ventilator. A ventilator is a machine that helps you breathe by moving air in and out of your lungs. After surgery. What are the  risks? Risks of using CHG include: A skin reaction. Hearing loss, if CHG gets in your ears and you have a perforated eardrum. Eye injury, if CHG gets in your eyes and is not rinsed out. The CHG product catching fire. Make sure that you avoid smoking and flames after applying CHG to your skin. Do not use CHG: If  you have a chlorhexidine allergy or have previously reacted to chlorhexidine. On babies younger than 69 months of age. How to use CHG solution Use CHG only as told by your health care provider, and follow the instructions on the label. Use the full amount of CHG as directed. Usually, this is one bottle. During a shower Follow these steps when using CHG solution during a shower (unless your health care provider gives you different instructions): Start the shower. Use your normal soap and shampoo to wash your face and hair. Turn off the shower or move out of the shower stream. Pour the CHG onto a clean washcloth. Do not use any type of brush or rough-edged sponge. Starting at your neck, lather your body down to your toes. Make sure you follow these instructions: If you will be having surgery, pay special attention to the part of your body where you will be having surgery. Scrub this area for at least 1 minute. Do not use CHG on your head or face. If the solution gets into your ears or eyes, rinse them well with water. Avoid your genital area. Avoid any areas of skin that have broken skin, cuts, or scrapes. Scrub your back and under your arms. Make sure to wash skin folds. Let the lather sit on your skin for 1-2 minutes or as long as told by your health care provider. Thoroughly rinse your entire body in the shower. Make sure that all body creases and crevices are rinsed well. Dry off with a clean towel. Do not put any substances on your body afterward--such as powder, lotion, or perfume--unless you are told to do so by your health care provider. Only use lotions that are recommended by  the manufacturer. Put on clean clothes or pajamas. If it is the night before your surgery, sleep in clean sheets.  During a sponge bath Follow these steps when using CHG solution during a sponge bath (unless your health care provider gives you different instructions): Use your normal soap and shampoo to wash your face and hair. Pour the CHG onto a clean washcloth. Starting at your neck, lather your body down to your toes. Make sure you follow these instructions: If you will be having surgery, pay special attention to the part of your body where you will be having surgery. Scrub this area for at least 1 minute. Do not use CHG on your head or face. If the solution gets into your ears or eyes, rinse them well with water. Avoid your genital area. Avoid any areas of skin that have broken skin, cuts, or scrapes. Scrub your back and under your arms. Make sure to wash skin folds. Let the lather sit on your skin for 1-2 minutes or as long as told by your health care provider. Using a different clean, wet washcloth, thoroughly rinse your entire body. Make sure that all body creases and crevices are rinsed well. Dry off with a clean towel. Do not put any substances on your body afterward--such as powder, lotion, or perfume--unless you are told to do so by your health care provider. Only use lotions that are recommended by the manufacturer. Put on clean clothes or pajamas. If it is the night before your surgery, sleep in clean sheets. How to use CHG prepackaged cloths Only use CHG cloths as told by your health care provider, and follow the instructions on the label. Use the CHG cloth on clean, dry skin. Do not use the CHG cloth on your head or face  unless your health care provider tells you to. When washing with the CHG cloth: Avoid your genital area. Avoid any areas of skin that have broken skin, cuts, or scrapes. Before surgery Follow these steps when using a CHG cloth to clean before surgery  (unless your health care provider gives you different instructions): Using the CHG cloth, vigorously scrub the part of your body where you will be having surgery. Scrub using a back-and-forth motion for 3 minutes. The area on your body should be completely wet with CHG when you are done scrubbing. Do not rinse. Discard the cloth and let the area air-dry. Do not put any substances on the area afterward, such as powder, lotion, or perfume. Put on clean clothes or pajamas. If it is the night before your surgery, sleep in clean sheets.  For general bathing Follow these steps when using CHG cloths for general bathing (unless your health care provider gives you different instructions). Use a separate CHG cloth for each area of your body. Make sure you wash between any folds of skin and between your fingers and toes. Wash your body in the following order, switching to a new cloth after each step: The front of your neck, shoulders, and chest. Both of your arms, under your arms, and your hands. Your stomach and groin area, avoiding the genitals. Your right leg and foot. Your left leg and foot. The back of your neck, your back, and your buttocks. Do not rinse. Discard the cloth and let the area air-dry. Do not put any substances on your body afterward--such as powder, lotion, or perfume--unless you are told to do so by your health care provider. Only use lotions that are recommended by the manufacturer. Put on clean clothes or pajamas. Contact a health care provider if: Your skin gets irritated after scrubbing. You have questions about using your solution or cloth. You swallow any chlorhexidine. Call your local poison control center (1-563-219-9854 in the [place] Get help right away if: Your eyes itch badly, or they become very red or swollen. Your skin itches badly and is red or swollen. Your hearing changes. You have trouble seeing. You have swelling or tingling in your mouth or throat. You have  trouble breathing. These symptoms may represent a serious problem that is an emergency. Do not wait to see if the symptoms will go away. Get medical help right away. Call your local emergency services (911 in the [place] Do not drive yourself to the hospital. Summary Chlorhexidine gluconate (CHG) is a germ-killing (antiseptic) solution that is used to clean the skin. Cleaning your skin with CHG may help to lower your risk for infection. You may be given CHG to use for bathing. It may be in a bottle or in a prepackaged cloth to use on your skin. Carefully follow your health care provider's instructions and the instructions on the product label. Do not use CHG if you have a chlorhexidine allergy. Contact your health care provider if your skin gets irritated after scrubbing. This information is not intended to replace advice given to you by your health care provider. Make sure you discuss any questions you have with your health care provider. Document Revised: 09/26/2020 Document Reviewed: 09/26/2020 Elsevier Patient Education  2022 [employer].

## 2021-08-23 ENCOUNTER — Encounter (HOSPITAL_COMMUNITY)
Admission: RE | Admit: 2021-08-23 | Discharge: 2021-08-23 | Disposition: A | Discharge status: Home / Self Care | Payer: Self-pay | Source: Ambulatory Visit | Attending: General Surgery | Admitting: General Surgery

## 2021-08-23 DIAGNOSIS — Z01812 Encounter for preprocedural laboratory examination: Secondary | ICD-10-CM | POA: Insufficient documentation

## 2021-08-23 DIAGNOSIS — D649 Anemia, unspecified: Secondary | ICD-10-CM | POA: Insufficient documentation

## 2021-08-23 LAB — CBC WITH DIFFERENTIAL/PLATELET
Abs Immature Granulocytes: 0.01 K/uL (ref 0.00–0.07)
Basophils Absolute: 0.1 K/uL (ref 0.0–0.1)
Basophils Relative: 1 %
Eosinophils Absolute: 0.1 K/uL (ref 0.0–0.5)
Eosinophils Relative: 3 %
HCT: 38.4 % (ref 36.0–46.0)
Hemoglobin: 12.7 g/dL (ref 12.0–15.0)
Immature Granulocytes: 0 %
Lymphocytes Relative: 46 %
Lymphs Abs: 2.2 K/uL (ref 0.7–4.0)
MCH: 33.8 pg (ref 26.0–34.0)
MCHC: 33.1 g/dL (ref 30.0–36.0)
MCV: 102.1 fL — ABNORMAL HIGH (ref 80.0–100.0)
Monocytes Absolute: 0.6 K/uL (ref 0.1–1.0)
Monocytes Relative: 12 %
Neutro Abs: 1.8 K/uL (ref 1.7–7.7)
Neutrophils Relative %: 38 %
Platelets: 307 K/uL (ref 150–400)
RBC: 3.76 MIL/uL — ABNORMAL LOW (ref 3.87–5.11)
RDW: 13.7 % (ref 11.5–15.5)
WBC: 4.8 K/uL (ref 4.0–10.5)
nRBC: 0 % (ref 0.0–0.2)

## 2021-08-25 ENCOUNTER — Ambulatory Visit (HOSPITAL_COMMUNITY): Payer: Medicaid Other | Admitting: Anesthesiology

## 2021-08-25 ENCOUNTER — Other Ambulatory Visit: Payer: Self-pay

## 2021-08-25 ENCOUNTER — Ambulatory Visit (HOSPITAL_COMMUNITY)
Admission: RE | Admit: 2021-08-25 | Discharge: 2021-08-25 | Disposition: A | Discharge status: Home / Self Care | Payer: Medicaid Other | Source: Ambulatory Visit | Attending: General Surgery | Admitting: General Surgery

## 2021-08-25 ENCOUNTER — Encounter (HOSPITAL_COMMUNITY)
Admission: RE | Disposition: A | Discharge status: Home / Self Care | Payer: Self-pay | Source: Ambulatory Visit | Attending: General Surgery

## 2021-08-25 ENCOUNTER — Encounter (HOSPITAL_COMMUNITY): Payer: Self-pay | Admitting: General Surgery

## 2021-08-25 DIAGNOSIS — F17210 Nicotine dependence, cigarettes, uncomplicated: Secondary | ICD-10-CM | POA: Insufficient documentation

## 2021-08-25 DIAGNOSIS — K279 Peptic ulcer, site unspecified, unspecified as acute or chronic, without hemorrhage or perforation: Secondary | ICD-10-CM | POA: Diagnosis not present

## 2021-08-25 DIAGNOSIS — J45909 Unspecified asthma, uncomplicated: Secondary | ICD-10-CM | POA: Diagnosis not present

## 2021-08-25 DIAGNOSIS — N6452 Nipple discharge: Secondary | ICD-10-CM | POA: Insufficient documentation

## 2021-08-25 DIAGNOSIS — N6012 Diffuse cystic mastopathy of left breast: Secondary | ICD-10-CM | POA: Diagnosis not present

## 2021-08-25 DIAGNOSIS — D649 Anemia, unspecified: Secondary | ICD-10-CM | POA: Diagnosis not present

## 2021-08-25 HISTORY — PX: BREAST BIOPSY: SHX20

## 2021-08-25 SURGERY — BREAST BIOPSY
Anesthesia: General | Site: Breast | Laterality: Left

## 2021-08-25 MED ORDER — BUPIVACAINE LIPOSOME 1.3 % IJ SUSP
INTRAMUSCULAR | Status: AC
  Filled 2021-08-25: qty 20

## 2021-08-25 MED ORDER — PROPOFOL 10 MG/ML IV BOLUS
INTRAVENOUS | Status: DC | PRN
  Administered 2021-08-25: 100 mg via INTRAVENOUS

## 2021-08-25 MED ORDER — CHLORHEXIDINE GLUCONATE CLOTH 2 % EX PADS
6.0000 | MEDICATED_PAD | Freq: Once | CUTANEOUS | Status: DC
Start: 2021-08-25 — End: 2021-08-25

## 2021-08-25 MED ORDER — ONDANSETRON HCL 4 MG/2ML IJ SOLN
INTRAMUSCULAR | Status: AC
  Filled 2021-08-25: qty 2

## 2021-08-25 MED ORDER — FENTANYL CITRATE (PF) 100 MCG/2ML IJ SOLN
INTRAMUSCULAR | Status: AC
  Filled 2021-08-25: qty 2

## 2021-08-25 MED ORDER — METHYLENE BLUE 0.5 % INJ SOLN
INTRAVENOUS | Status: AC
  Filled 2021-08-25: qty 10

## 2021-08-25 MED ORDER — FENTANYL CITRATE PF 50 MCG/ML IJ SOSY: 50.0000 ug | PREFILLED_SYRINGE | INTRAMUSCULAR | Status: DC | PRN

## 2021-08-25 MED ORDER — OXYCODONE HCL 5 MG PO TABS: 5.0000 mg | ORAL_TABLET | ORAL | 0 refills | Status: DC | PRN

## 2021-08-25 MED ORDER — FENTANYL CITRATE (PF) 100 MCG/2ML IJ SOLN
INTRAMUSCULAR | Status: DC | PRN
  Administered 2021-08-25 (×4): 25 ug via INTRAVENOUS
  Administered 2021-08-25: 50 ug via INTRAVENOUS

## 2021-08-25 MED ORDER — LIDOCAINE HCL (PF) 2 % IJ SOLN
INTRAMUSCULAR | Status: AC
  Filled 2021-08-25: qty 5

## 2021-08-25 MED ORDER — CHLORHEXIDINE GLUCONATE 0.12 % MT SOLN: 15.0000 mL | Freq: Once | OROMUCOSAL | Status: DC

## 2021-08-25 MED ORDER — CEFAZOLIN SODIUM-DEXTROSE 2-4 GM/100ML-% IV SOLN
2.0000 g | INTRAVENOUS | Status: AC
  Administered 2021-08-25: 2 g via INTRAVENOUS
  Filled 2021-08-25: qty 100

## 2021-08-25 MED ORDER — FENTANYL CITRATE PF 50 MCG/ML IJ SOSY
50.0000 ug | PREFILLED_SYRINGE | INTRAMUSCULAR | Status: DC | PRN
  Administered 2021-08-25: 50 ug via INTRAVENOUS

## 2021-08-25 MED ORDER — MIDAZOLAM HCL 5 MG/5ML IJ SOLN
INTRAMUSCULAR | Status: DC | PRN
  Administered 2021-08-25: 2 mg via INTRAVENOUS

## 2021-08-25 MED ORDER — MIDAZOLAM HCL 2 MG/2ML IJ SOLN
INTRAMUSCULAR | Status: AC
  Filled 2021-08-25: qty 2

## 2021-08-25 MED ORDER — ONDANSETRON HCL 4 MG PO TABS: 4.0000 mg | ORAL_TABLET | Freq: Three times a day (TID) | ORAL | 1 refills | Status: DC | PRN

## 2021-08-25 MED ORDER — SODIUM CHLORIDE 0.9 % IR SOLN
Status: DC | PRN
  Administered 2021-08-25: 1000 mL

## 2021-08-25 MED ORDER — LACTATED RINGERS IV SOLN: INTRAVENOUS | Status: DC

## 2021-08-25 MED ORDER — FENTANYL CITRATE PF 50 MCG/ML IJ SOSY
50.0000 ug | PREFILLED_SYRINGE | INTRAMUSCULAR | Status: DC | PRN
  Administered 2021-08-25: 50 ug via INTRAVENOUS
  Filled 2021-08-25 (×2): qty 1

## 2021-08-25 MED ORDER — ONDANSETRON HCL 4 MG/2ML IJ SOLN
INTRAMUSCULAR | Status: DC | PRN
  Administered 2021-08-25: 4 mg via INTRAVENOUS

## 2021-08-25 MED ORDER — BUPIVACAINE LIPOSOME 1.3 % IJ SUSP
INTRAMUSCULAR | Status: DC | PRN
  Administered 2021-08-25: 20 mL

## 2021-08-25 MED ORDER — METHYLENE BLUE 0.5 % INJ SOLN
INTRAVENOUS | Status: DC | PRN
  Administered 2021-08-25: 1 mL via SUBMUCOSAL

## 2021-08-25 MED ORDER — LIDOCAINE HCL (CARDIAC) PF 50 MG/5ML IV SOSY
PREFILLED_SYRINGE | INTRAVENOUS | Status: DC | PRN
  Administered 2021-08-25: 60 mg via INTRAVENOUS

## 2021-08-25 MED ORDER — FENTANYL CITRATE (PF) 250 MCG/5ML IJ SOLN
INTRAMUSCULAR | Status: AC
  Filled 2021-08-25: qty 5

## 2021-08-25 MED ORDER — ORAL CARE MOUTH RINSE: 15.0000 mL | Freq: Once | OROMUCOSAL | Status: DC

## 2021-08-25 SURGICAL SUPPLY — 32 items
ADH SKN CLS APL DERMABOND .7 (GAUZE/BANDAGES/DRESSINGS) ×1
APL PRP STRL LF DISP 70% ISPRP (MISCELLANEOUS) ×1
BLADE SURG 15 STRL LF DISP TIS (BLADE) ×1 IMPLANT
BLADE SURG 15 STRL SS (BLADE) ×2
CHLORAPREP W/TINT 26 (MISCELLANEOUS) ×2 IMPLANT
CLOTH BEACON ORANGE TIMEOUT ST (SAFETY) ×2 IMPLANT
COVER LIGHT HANDLE STERIS (MISCELLANEOUS) ×4 IMPLANT
DERMABOND ADVANCED (GAUZE/BANDAGES/DRESSINGS) ×2
DERMABOND ADVANCED .7 DNX12 (GAUZE/BANDAGES/DRESSINGS) ×1 IMPLANT
ELECT REM PT RETURN 9FT ADLT (ELECTROSURGICAL) ×2
ELECTRODE REM PT RTRN 9FT ADLT (ELECTROSURGICAL) ×1 IMPLANT
GAUZE 4X4 16PLY ~~LOC~~+RFID DBL (SPONGE) ×2 IMPLANT
GLOVE SURG ENC MOIS LTX SZ6.5 (GLOVE) ×2 IMPLANT
GLOVE SURG UNDER POLY LF SZ7 (GLOVE) ×6 IMPLANT
GOWN STRL REUS W/TWL LRG LVL3 (GOWN DISPOSABLE) ×4 IMPLANT
Insyte Autoguard BC Winged ×1 IMPLANT
KIT TURNOVER KIT A (KITS) ×2 IMPLANT
MANIFOLD NEPTUNE II (INSTRUMENTS) ×2 IMPLANT
NDL HYPO 18GX1.5 BLUNT FILL (NEEDLE) ×1 IMPLANT
NDL HYPO 25X1 1.5 SAFETY (NEEDLE) ×1 IMPLANT
NEEDLE HYPO 18GX1.5 BLUNT FILL (NEEDLE) ×2 IMPLANT
NEEDLE HYPO 25X1 1.5 SAFETY (NEEDLE) ×2 IMPLANT
NS IRRIG 1000ML POUR BTL (IV SOLUTION) ×2 IMPLANT
PACK MINOR (CUSTOM PROCEDURE TRAY) ×2 IMPLANT
PAD ARMBOARD 7.5X6 YLW CONV (MISCELLANEOUS) ×2 IMPLANT
SET BASIN LINEN APH (SET/KITS/TRAYS/PACK) ×2 IMPLANT
SUT MNCRL AB 4-0 PS2 18 (SUTURE) ×2 IMPLANT
SUT VIC AB 3-0 SH 27 (SUTURE)
SUT VIC AB 3-0 SH 27X BRD (SUTURE) IMPLANT
SYR 10ML LL (SYRINGE) ×3 IMPLANT
SYR 20ML LL LF (SYRINGE) ×5 IMPLANT
SYR CONTROL 10ML LL (SYRINGE) ×3 IMPLANT

## 2021-08-25 NOTE — Op Note (Signed)
[facility] Operative Note  08/25/21  Preoperative Diagnosis: Left nipple bloody discharge    Postoperative Diagnosis: Same   Procedure(s) Performed: Excisional biopsy of breast tissue (including duct)    Surgeon: Lanell Matar. Constance Haw, MD   Assistants: No qualified resident was available    Anesthesia: General endotracheal   Anesthesiologist: Dr. Briant Cedar   Specimens: Breast tissue and duct, superficial silk suture to duct, methylene blue in duct with discharge    Estimated Blood Loss: Minimal   Blood Replacement: None    Complications: None   Wound Class: Clean contaminated (some milky discharge from duct system)    Operative Indications: Ms. Scalisi a 51 yo with bloody nipple discharge and negative imaging. She wanted her duct excised given the embarrassment of the discharge on her clothes and breast pain. We discussed option of MRI versus surgery. We discussed bleeding, infection, finding cancer, needing more surgery, and that the pain may not change given that it is likely multifactorial.   Findings: Blood nipple discharge from a single duct with expression, some milk discharge noted deeper on disrupted ducts during the excision    Procedure: The patient was taken to the operating room and placed supine. General endotracheal anesthesia was induced. Intravenous antibiotics were administered per protocol.  The left breast was prepared and draped in the usual sterile fashion.   The nipple discharge was expressed from a single duct and was dark brown in nature. A lacrimal probe was placed easily. Then a 12 Angiocath was used to inject methylene blue into that duct once it was dilated. I placed the lacrimal probe back to help with guidance. A circumareolar incision was made on the latera aspect of the areola.  This was carried down and flaps were created. Using the probe as a guide the entire duct and some surrounding tissue was excised. The disrupted ducts did have  some minor milky discharge.  The entire duct in question was excised and the superficial aspect was marked with a silk suture. The cavity was made hemostatic. Exparel was injected. The deep space was closed with 3-0 Vicryl interrupted and the skin was closed with a running 4-0 Monocryl subcuticular and dermabond.   Final inspection revealed acceptable hemostasis. All counts were correct at the end of the case. The patient was awakened from anesthesia and extubated without complication.  The patient went to the PACU in stable condition.   Curlene Labrum, MD [facility] [address] [place], [place] [address] [phone] (office)

## 2021-08-25 NOTE — Transfer of Care (Signed)
Immediate Anesthesia Transfer of Care Note  Patient: Marissa Frank  Procedure(s) Performed: BREAST BIOPSY WITH DUCT EXCISIONS (Left: Breast)  Patient Location: PACU  Anesthesia Type:General  Level of Consciousness: awake  Airway & Oxygen Therapy: Patient Spontanous Breathing  Post-op Assessment: Report given to RN  Post vital signs: Reviewed and stable  Last Vitals:  Vitals Value Taken Time  BP    Temp    Pulse 69 08/25/21 1032  Resp 16 08/25/21 1032  SpO2 99 % 08/25/21 1032  Vitals shown include unvalidated device data.  Last Pain:  Vitals:   08/25/21 0830  TempSrc:   PainSc: 6          Complications: No notable events documented.

## 2021-08-25 NOTE — H&P (Signed)
[facility] History and Physical      Marissa Frank is a 51 y.o. female.  HPI: Marissa Frank is a 51 yo known to me with left breast bloody nipple discharge that is spontaneous and associated breast pain. She was seen in November but had delays in getting any surgery given the need to get it cleared by insurance. The patient had prior mammogram and [place] that were negative. She did not want to get an MRI but wanted to proceed with surgery given the annoyance and embarrassment from the drainage onto her clothes. She continues to have drainage. She says her pain is worse now than it was prior.   Past Medical History:  Diagnosis Date   Asthma    Cancer [facility])    cervical   Depression    Hemorrhoids     Past Surgical History:  Procedure Laterality Date   BIOPSY  04/24/2018   Procedure: BIOPSY;  Surgeon: Daneil Dolin, MD;  [facility];  Service: Endoscopy;;   BIOPSY  01/20/2021   Procedure: BIOPSY;  Surgeon: Daneil Dolin, MD;  [facility];  Service: Endoscopy;;  gastric   BREAST SURGERY     benign tumor   CERVICAL CONE BIOPSY     COLONOSCOPY WITH PROPOFOL N/A 01/20/2021   hemorrhoids, one 5 mm polyp in sigmoid colon, simgoid and descending colon diverticulosis. leiomyoma   ESOPHAGOGASTRODUODENOSCOPY (EGD) WITH PROPOFOL N/A 04/24/2018   reflux esophagitis s/p dilation, small hiatal hernia, multiple small gastric ulcers s/p biopsy, reactive gastropathy, no erosions. +NSAIDs at that time.   ESOPHAGOGASTRODUODENOSCOPY (EGD) WITH PROPOFOL N/A 01/20/2021   normal esophagus s/p dilation, small hiatal hernia, antral erosions s/p biopsy. Normal duodenum.   MALONEY DILATION N/A 04/24/2018   Procedure: Venia Minks DILATION;  Surgeon: Daneil Dolin, MD;  [facility];  Service: Endoscopy;  Laterality: N/A;   MALONEY DILATION N/A 01/20/2021   Procedure: Venia Minks DILATION;  Surgeon: Daneil Dolin, MD;  [facility];  Service:  Endoscopy;  Laterality: N/A;   POLYPECTOMY  01/20/2021   Procedure: POLYPECTOMY;  Surgeon: Daneil Dolin, MD;  [facility];  Service: Endoscopy;;   TUBAL LIGATION     WISDOM TOOTH EXTRACTION      Family History  Problem Relation Age of Onset   Hypertension Mother    Stroke Mother    Colon cancer Neg Hx    Colon polyps Neg Hx    Stomach cancer Neg Hx    Liver disease Neg Hx     Social History   Tobacco Use   Smoking status: Every Day    Packs/day: 0.50    Types: Cigarettes   Smokeless tobacco: Never  Vaping Use   Vaping Use: Former  Substance Use Topics   Alcohol use: Yes    Alcohol/week: 7.0 standard drinks    Types: 7 Glasses of wine per week    Comment: 05/23/21-glass of wine nightly   Drug use: Not Currently    Types: Marijuana    Comment: none since teenager    Medications: I have reviewed the patient's current medications. Medications Prior to Admission  Medication Sig Dispense Refill   albuterol (PROVENTIL) (2.5 MG/3ML) 0.083% nebulizer solution Take 3 mLs (2.5 mg total) by nebulization every 6 (six) hours as needed for wheezing or shortness of breath. 75 mL 6   ferrous sulfate 325 (65 FE) MG tablet Take 325 mg by mouth daily as needed (feeling weak).  omeprazole (PRILOSEC) 40 MG capsule Take 1 capsule (40 mg total) by mouth daily. 30 minutes before breakfast (Patient not taking: Reported on 08/08/2021) 90 capsule 3    Allergies  Allergen Reactions   Almond Oil Itching   Carrot [Daucus Carota] Itching   Celery Oil Itching   Coconut Flavor Nausea And Vomiting and Other (See Comments)    Itchy throat   Macadamia Nut Oil Itching     ROS:  A comprehensive review of systems was negative except for: Integument/breast: positive for breast tenderness and nipple discharge  Blood pressure 124/83, pulse (!) 51, temperature 97.9 F (36.6 C), temperature source Oral, resp. rate 16, height 5\' 1"  (1.549 m), weight 46.3 kg, last menstrual period  06/29/2018, SpO2 100 %. Physical Exam Vitals reviewed.  Constitutional:      Appearance: Normal appearance.  HENT:     Head: Normocephalic.     Nose: Nose normal.  Eyes:     Extraocular Movements: Extraocular movements intact.  Cardiovascular:     Rate and Rhythm: Normal rate.  Pulmonary:     Effort: Pulmonary effort is normal.  Chest:     Comments: Breast exam deferred, do not want to express drainage prior to excision  Abdominal:     General: There is no distension.     Palpations: Abdomen is soft.     Tenderness: There is no abdominal tenderness.  Musculoskeletal:        General: Normal range of motion.  Skin:    General: Skin is warm.  Neurological:     General: No focal deficit present.     Mental Status: She is alert and oriented to person, place, and time.  Psychiatric:        Mood and Affect: Mood normal.        Behavior: Behavior normal.    Results:  CLINICAL DATA:  Bloody nipple discharge on the left 1st noticed 2-3 weeks ago when washing the breast in the shower. She has subsequently been able to express bloody discharge from the left nipple.   EXAM: DIGITAL DIAGNOSTIC UNILATERAL LEFT MAMMOGRAM WITH TOMOSYNTHESIS AND CAD; ULTRASOUND LEFT BREAST LIMITED   TECHNIQUE: Left digital diagnostic mammography and breast tomosynthesis was performed. The images were evaluated with computer-aided detection.; Targeted ultrasound examination of the left breast was performed.   COMPARISON:  Previous exam(s).   ACR Breast Density Category c: The breast tissue is heterogeneously dense, which may obscure small masses.   FINDINGS: 3D tomographic and 2D generated mammographic images of the left breast demonstrate a probable dilated retroareolar duct. There are no visible masses or other findings suspicious for malignancy. A previously marked skin mole in the upper outer left breast is unchanged.   On physical exam, a tiny amount of bloody discharge was  elicited from a single opening in the central left nipple in the 3 o'clock position. There was no palpable mass.   Targeted ultrasound is performed, showing multiple dilated retroareolar and periareolar ducts on the left. Some of these contain internal debris with no sonographically visible masses. No internal blood flow was seen in any of the ducts with power Doppler.   IMPRESSION: 1. Benign left breast duct ectasia. 2. Single duct bloody nipple discharge on the left with no cause seen with mammography or ultrasound.   RECOMMENDATION: Bilateral MRI of the breasts with and without contrast and surgical consultation for the bloody nipple discharge on the left.   I have discussed the findings and recommendations with the patient. If  applicable, a reminder letter will be sent to the patient regarding the next appointment.   BI-RADS CATEGORY  2: Benign.     Electronically Signed   By: Claudie Revering M.D.   On: 05/24/2021 16:47  Assessment & Plan:  Marissa Frank is a 51 y.o. female with left sided bloody nipple discharge and pain. We discussed prior that this is likely benign and I gave the option of MRI. She wanted to proceed with surgery given the continued discharge. She also having pain. I explained that the pain may not get better as breast pain is multi-factorial and can be related to diet, hormones, etc. Discussed that we will try to localize the draining duct and excision that versus doing a full retro-areola excision if I cannot get the duct to discharge at the time of OR. Discussed risk of bleeding, infection, finding cancer, cosmetic changes to the breast, and that the pain could continue.    All questions were answered to the satisfaction of the patient.   Marissa Frank 08/25/2021, 9:05 AM

## 2021-08-25 NOTE — Progress Notes (Signed)
[facility]  Updated significant other. Rx sent to pharmacy. Will see in a few weeks will call with pathology. May have some blue dye on her breast and may have some green urine given the methylene blue metabolism.   Curlene Labrum, MD [facility] [address] [place], [place] [address] [phone] (office)

## 2021-08-25 NOTE — Anesthesia Postprocedure Evaluation (Signed)
Anesthesia Post Note  Patient: Marissa Frank  Procedure(s) Performed: BREAST BIOPSY WITH DUCT EXCISIONS (Left: Breast)  Patient location during evaluation: Phase II Anesthesia Type: General Level of consciousness: awake Pain management: pain level controlled Vital Signs Assessment: post-procedure vital signs reviewed and stable Respiratory status: spontaneous breathing and respiratory function stable Cardiovascular status: blood pressure returned to baseline and stable Postop Assessment: no headache and no apparent nausea or vomiting Anesthetic complications: no Comments: Late entry   No notable events documented.   Last Vitals:  Vitals:   08/25/21 1100 08/25/21 1115  BP: 131/80 117/79  Pulse: (!) 51 (!) 52  Resp: 12 16  Temp:  36.7 C  SpO2: 95% 93%    Last Pain:  Vitals:   08/25/21 1115  TempSrc: Oral  PainSc: [place]

## 2021-08-25 NOTE — Anesthesia Procedure Notes (Signed)
Procedure Name: LMA Insertion Date/Time: 08/25/2021 9:30 AM Performed by: Ollen Bowl, CRNA Pre-anesthesia Checklist: Patient identified, Patient being monitored, Emergency Drugs available, Timeout performed and Suction available Patient Re-evaluated:Patient Re-evaluated prior to induction Oxygen Delivery Method: Circle System Utilized Preoxygenation: Pre-oxygenation with 100% oxygen Induction Type: IV induction Ventilation: Mask ventilation without difficulty LMA: LMA inserted LMA Size: 4.0 and 3.0 Number of attempts: 1 Placement Confirmation: positive ETCO2 and breath sounds checked- equal and bilateral

## 2021-08-25 NOTE — Discharge Instructions (Signed)
Discharge instructions after breast surgery:   Common Complaints: Pain and bruising at the incision sites.  Swelling at the incision sites. There may be blue dye on your skin that will wash off, and you may have some green color in your urine due to the blue dye that was used to mark the duct that was bleeding.   Diet/ Activity: Diet as tolerated.  You may shower but do not take hot showers as this can disrupt the glue. Rest and listen to your body, but do not remain in bed all day.  Walk everyday for at least 15-20 minutes. Deep cough and move around every 1-2 hours in the first few days after surgery.  Do not lift > 10 lbs for the first 2 weeks after surgery. Do not do anything that makes you feel like you are putting unnecessary pull or stretch on the incision sites.  Do not pick at the dermabond glue on your incision sites.  This glue film will remain in place for 1-2 weeks and will start to peel off.  Do not place lotions or balms on your incision unless instructed to specifically by Dr. Constance Haw.   Pain Expectations and Narcotics: -After surgery you will have pain associated with your incisions and this is normal. The pain is muscular and nerve pain, and will get better with time. -You are encouraged and expected to take non narcotic medications like tylenol and ibuprofen (when able) to treat pain as multiple modalities can aid with pain treatment. -Narcotics are only used when pain is severe or there is breakthrough pain. -You are not expected to have a pain score of 0 after surgery, as we cannot prevent pain. A pain score of 3-4 that allows you to be functional, move, walk, and tolerate some activity is the goal. The pain will continue to improve over the days after surgery and is dependent on your surgery. -Due to [place] law, we are only able to give a certain amount of pain medication to treat post operative pain, and we only give additional narcotics on a patient by patient basis.  -For  most laparoscopic surgery, studies have shown that the majority of patients only need 10-15 narcotic pills, and for open surgeries most patients only need 15-20.   -Having appropriate expectations of pain and knowledge of pain management with non narcotics is important as we do not want anyone to become addicted to narcotic pain medication.  -Using ice packs in the first 48 hours and heating pads after 48 hours, wearing an abdominal binder (when recommended), and using over the counter medications are all ways to help with pain management.   -Simple acts like meditation and mindfulness practices after surgery can also help with pain control and research has proven the benefit of these practices.  Medication: Take tylenol and ibuprofen as needed for pain control, alternating every 4-6 hours.  Example:  Tylenol 1000mg  @ 6am, 12noon, 6pm, 27midnight (Do not exceed 4000mg  of tylenol a day). Ibuprofen 800mg  @ 9am, 3pm, 9pm, 3am (Do not exceed 3600mg  of ibuprofen a day).  Take Roxicodone for breakthrough pain every 4 hours.  Take Colace for constipation related to narcotic pain medication. If you do not have a bowel movement in 2 days, take Miralax over the counter.  Drink plenty of water to also prevent constipation.   Contact Information: If you have questions or concerns, please call our office, [phone], Monday- Thursday 8AM-5PM and Friday 8AM-12Noon.  If it is after hours or on  the weekend, please call [facility] Main Number, [phone], [phone], and ask to speak to the surgeon on call for Dr. Constance Haw at [facility].

## 2021-08-25 NOTE — Anesthesia Preprocedure Evaluation (Addendum)
Anesthesia Evaluation  Patient identified by MRN, date of birth, ID band Patient awake    Reviewed: Allergy & Precautions, H&P , NPO status , Patient's Chart, lab work & pertinent test results, reviewed documented beta blocker date and time   Airway Mallampati: II  TM Distance: >3 FB Neck ROM: full    Dental no notable dental hx.    Pulmonary asthma , Current Smoker,    Pulmonary exam normal breath sounds clear to auscultation       Cardiovascular Exercise Tolerance: Good negative cardio ROS   Rhythm:regular Rate:Normal     Neuro/Psych PSYCHIATRIC DISORDERS Depression negative neurological ROS     GI/Hepatic Neg liver ROS, PUD,   Endo/Other  negative endocrine ROS  Renal/GU negative Renal ROS  negative genitourinary   Musculoskeletal   Abdominal   Peds  Hematology  (+) Blood dyscrasia, anemia ,   Anesthesia Other Findings   Reproductive/Obstetrics negative OB ROS                             Anesthesia Physical Anesthesia Plan  ASA: 2  Anesthesia Plan: General and General LMA   Post-op Pain Management:    Induction:   PONV Risk Score and Plan: Ondansetron  Airway Management Planned:   Additional Equipment:   Intra-op Plan:   Post-operative Plan:   Informed Consent: I have reviewed the patients History and Physical, chart, labs and discussed the procedure including the risks, benefits and alternatives for the proposed anesthesia with the patient or authorized representative who has indicated his/her understanding and acceptance.     Dental Advisory Given  Plan Discussed with: CRNA  Anesthesia Plan Comments:        Anesthesia Quick Evaluation

## 2021-08-28 ENCOUNTER — Telehealth (INDEPENDENT_AMBULATORY_CARE_PROVIDER_SITE_OTHER): Payer: Self-pay | Admitting: General Surgery

## 2021-08-28 ENCOUNTER — Telehealth: Payer: Self-pay

## 2021-08-28 ENCOUNTER — Encounter (HOSPITAL_COMMUNITY): Payer: Self-pay | Admitting: General Surgery

## 2021-08-28 DIAGNOSIS — N6452 Nipple discharge: Secondary | ICD-10-CM

## 2021-08-28 LAB — SURGICAL PATHOLOGY

## 2021-08-28 NOTE — Telephone Encounter (Signed)
[facility]  Updated patient that pathology all benign. She is doing well.   FINAL MICROSCOPIC DIAGNOSIS:   A. BREAST, LEFT, BIOPSY:  - Fibrocystic changes.  - No evidence of malignancy.   GROSS DESCRIPTION:   A. Received in formalin labeled with the patients name and "Left breast  biopsy with duct excisions" is a 5.5 x 2.2 x 1.5 cm piece of yellow-tan,  lobulated, rubbery fibrofatty tissue with streaks of blue discoloration  and a stitch at one end marking the superficial aspect. The specimen is  entirely inked black and sectioned to reveal dilated ducts filled with  tan, opaque material. Entirely and sequentially submitted from  superficial to deep in six cassettes.  Curlene Labrum, MD [facility] [address] [place], [place] [address] [phone] (office)

## 2021-08-28 NOTE — Telephone Encounter (Signed)
Pt called & left vm asking about Rx sent in after surgery, Returned call to pt regarding Rx, pt stated that she has already taken care of issue

## 2021-09-12 ENCOUNTER — Encounter: Payer: Self-pay | Admitting: General Surgery

## 2021-09-12 ENCOUNTER — Other Ambulatory Visit: Payer: Self-pay

## 2021-09-12 ENCOUNTER — Ambulatory Visit (INDEPENDENT_AMBULATORY_CARE_PROVIDER_SITE_OTHER): Payer: Self-pay | Admitting: General Surgery

## 2021-09-12 VITALS — BP 124/88 | HR 64 | Temp 98.6°F | Resp 16 | Ht 61.0 in | Wt 103.0 lb

## 2021-09-12 DIAGNOSIS — N6012 Diffuse cystic mastopathy of left breast: Secondary | ICD-10-CM

## 2021-09-12 DIAGNOSIS — M792 Neuralgia and neuritis, unspecified: Secondary | ICD-10-CM | POA: Insufficient documentation

## 2021-09-12 MED ORDER — GABAPENTIN 100 MG PO CAPS: 300.0000 mg | ORAL_CAPSULE | Freq: Three times a day (TID) | ORAL | 0 refills | Status: DC

## 2021-09-12 NOTE — Progress Notes (Signed)
[facility]  Doing ok but having shooting pain in the nipple. Had some chronic pain before. No further drainage. Pathology all benign.  BP 124/88    Pulse 64    Temp 98.6 F (37 C) (Other (Comment))    Resp 16    Ht 5\' 1"  (1.549 m)    Wt 103 lb (46.7 kg)    LMP 06/29/2018    SpO2 99%    BMI 19.46 kg/m  Incision healing, glue peeling, minimal seroma   Patient s/p excisional breast biopsy with fibrocystic changes. Doing fair but having some neuropathic pain.  Take the gabapentin 100mg  TID see if it will help with the sensitivity.  Will see you back in 2 weeks.    Future Appointments  Date Time Provider [place]  09/26/2021  9:15 AM Virl Cagey, MD [facility] None  10/17/2021  9:30 AM Soyla Dryer, PA-C [facility] None  11/21/2021  9:30 AM Annitta Needs, NP [place], MD [facility] [address] [place], [place] [address] [phone] (office)

## 2021-09-12 NOTE — Patient Instructions (Signed)
Take the gabapentin see if it will help with the sensitivity.  Will see you back in 2 weeks.

## 2021-09-26 ENCOUNTER — Other Ambulatory Visit: Payer: Self-pay

## 2021-09-26 ENCOUNTER — Encounter: Payer: Self-pay | Admitting: General Surgery

## 2021-09-26 ENCOUNTER — Ambulatory Visit (INDEPENDENT_AMBULATORY_CARE_PROVIDER_SITE_OTHER): Payer: Self-pay | Admitting: General Surgery

## 2021-09-26 ENCOUNTER — Telehealth: Payer: Self-pay | Admitting: *Deleted

## 2021-09-26 VITALS — BP 130/81 | HR 73 | Temp 98.4°F | Resp 14 | Ht 61.0 in | Wt 104.0 lb

## 2021-09-26 DIAGNOSIS — M792 Neuralgia and neuritis, unspecified: Secondary | ICD-10-CM

## 2021-09-26 NOTE — Patient Instructions (Addendum)
Continue to use the gabapentin. Can wean off if getting better. No lifting greater than 10 lbs due to the sensitivity to the breast incision. Follow up in 6 weeks.   What are the treatments for breast pain? Use less salt. Wear a supportive bra. Apply local heat to the painful area. Take over-the-counter pain relievers sparingly, as needed. Avoid caffeine. Well-designed studies have not shown that avoiding caffeine can treat breast pain. However, many women report significant improvement in their symptoms when they reduce their intake of tea, coffee, chocolate, and energy drinks. Try Vitamin E. Studies have not consistently shown benefits of vitamin E for treating breast pain, though some women find it helpful. Using vitamin E for a few weeks to see if it will help is unlikely to cause any harm. However, long-term use of vitamin E supplementation is not recommended for breast pain, as there are some studies suggesting this may not be safe. Try evening primrose oil. Similar to vitamin E, studies have not consistently shown evening primrose oil to be helpful in treating breast pain, though it does help some women. Evening primrose oil is found over-the-counter. Side effects might include nausea, diarrhea, and headaches. In the past, there was concern that certain patients might be at increased risk of seizures when taking this supplement, though this is now disputed. Try Omega-3 fatty acid. Though not proven to be effective in rigorous studies, some women find increased intake of fish oils/omega-3 supplements to be helpful. Natural dietary sources include: dark green leafy vegetables, ocean-raised ("wild") cold-water fish, flax, walnuts, and sesame. Omega-3 supplements are also available by prescription and over-the-counter. Give it time. Most commonly, pain goes away on its own after a few months, without the need for any treatment.

## 2021-09-26 NOTE — Telephone Encounter (Signed)
No lifting >10 lbs.   Re-evaluate at 6 week F/U

## 2021-09-26 NOTE — Progress Notes (Signed)
[facility]  Doing better but still with sensitive stingy pain. No blood nipple discharge.  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 Improving but gabapentin makes her sleepy so cannot take it all the time.  Continue to use the gabapentin. Can wean off if getting better. No lifting greater than 10 lbs due to the sensitivity to the breast incision. Follow up in 6 weeks.  Ok to use ointments.   What are the treatments for breast pain? Use less salt. Wear a supportive bra. Apply local heat to the painful area. Take over-the-counter pain relievers sparingly, as needed. Avoid caffeine. Well-designed studies have not shown that avoiding caffeine can treat breast pain. However, many women report significant improvement in their symptoms when they reduce their intake of tea, coffee, chocolate, and energy drinks. Try Vitamin E. Studies have not consistently shown benefits of vitamin E for treating breast pain, though some women find it helpful. Using vitamin E for a few weeks to see if it will help is unlikely to cause any harm. However, long-term use of vitamin E supplementation is not recommended for breast pain, as there are some studies suggesting this may not be safe. Try evening primrose oil. Similar to vitamin E, studies have not consistently shown evening primrose oil to be helpful in treating breast pain, though it does help some women. Evening primrose oil is found over-the-counter. Side effects might include nausea, diarrhea, and headaches. In the past, there was concern that certain patients might be at increased risk of seizures when taking this supplement, though this is now disputed. Try Omega-3 fatty acid. Though  not proven to be effective in rigorous studies, some women find increased intake of fish oils/omega-3 supplements to be helpful. Natural dietary sources include: dark green leafy vegetables, ocean-raised ("wild") cold-water fish, flax, walnuts, and sesame. Omega-3 supplements are also available by prescription and over-the-counter. Give it time. Most commonly, pain goes away on its own after a few months, without the need for any treatment.  Future Appointments  Date Time Provider [place]  10/17/2021  9:30 AM Soyla Dryer, PA-C [facility] None  11/07/2021  9:15 AM Virl Cagey, MD [facility] None  11/21/2021  9:30 AM Annitta Needs, NP [place], MD [facility] [address] [place], [place] [address] [phone] (office)

## 2021-10-08 ENCOUNTER — Encounter (HOSPITAL_COMMUNITY): Payer: Self-pay | Admitting: Emergency Medicine

## 2021-10-08 ENCOUNTER — Other Ambulatory Visit: Payer: Self-pay

## 2021-10-08 ENCOUNTER — Emergency Department (HOSPITAL_COMMUNITY)
Admission: EM | Admit: 2021-10-08 | Discharge: 2021-10-09 | Disposition: A | Discharge status: Home / Self Care | Payer: Self-pay | Attending: Emergency Medicine | Admitting: Emergency Medicine

## 2021-10-08 DIAGNOSIS — D72819 Decreased white blood cell count, unspecified: Secondary | ICD-10-CM | POA: Insufficient documentation

## 2021-10-08 DIAGNOSIS — D75839 Thrombocytosis, unspecified: Secondary | ICD-10-CM | POA: Insufficient documentation

## 2021-10-08 DIAGNOSIS — R45851 Suicidal ideations: Secondary | ICD-10-CM | POA: Insufficient documentation

## 2021-10-08 DIAGNOSIS — F333 Major depressive disorder, recurrent, severe with psychotic symptoms: Secondary | ICD-10-CM | POA: Insufficient documentation

## 2021-10-08 DIAGNOSIS — Z20822 Contact with and (suspected) exposure to covid-19: Secondary | ICD-10-CM | POA: Insufficient documentation

## 2021-10-08 DIAGNOSIS — R45850 Homicidal ideations: Secondary | ICD-10-CM | POA: Insufficient documentation

## 2021-10-08 LAB — CBC
HCT: 42.9 % (ref 36.0–46.0)
Hemoglobin: 14 g/dL (ref 12.0–15.0)
MCH: 33.7 pg (ref 26.0–34.0)
MCHC: 32.6 g/dL (ref 30.0–36.0)
MCV: 103.1 fL — ABNORMAL HIGH (ref 80.0–100.0)
Platelets: 425 10*3/uL — ABNORMAL HIGH (ref 150–400)
RBC: 4.16 MIL/uL (ref 3.87–5.11)
RDW: 14.7 % (ref 11.5–15.5)
WBC: 3.8 10*3/uL — ABNORMAL LOW (ref 4.0–10.5)
nRBC: 0 % (ref 0.0–0.2)

## 2021-10-08 LAB — RAPID URINE DRUG SCREEN, HOSP PERFORMED
Amphetamines: NOT DETECTED
Barbiturates: NOT DETECTED
Benzodiazepines: NOT DETECTED
Cocaine: NOT DETECTED
Opiates: NOT DETECTED
Tetrahydrocannabinol: NOT DETECTED

## 2021-10-08 LAB — COMPREHENSIVE METABOLIC PANEL
ALT: 22 U/L (ref 0–44)
AST: 36 U/L (ref 15–41)
Albumin: 4.1 g/dL (ref 3.5–5.0)
Alkaline Phosphatase: 70 U/L (ref 38–126)
Anion gap: 10 (ref 5–15)
BUN: 7 mg/dL (ref 6–20)
CO2: 24 mmol/L (ref 22–32)
Calcium: 9.9 mg/dL (ref 8.9–10.3)
Chloride: 107 mmol/L (ref 98–111)
Creatinine, Ser: 0.64 mg/dL (ref 0.44–1.00)
GFR, Estimated: >60 mL/min (ref >60)
Glucose, Bld: 94 mg/dL (ref 70–99)
Potassium: 4.2 mmol/L (ref 3.5–5.1)
Sodium: 141 mmol/L (ref 135–145)
Total Bilirubin: 0.4 mg/dL (ref 0.3–1.2)
Total Protein: 7.7 g/dL (ref 6.5–8.1)

## 2021-10-08 LAB — RESP PANEL BY RT-PCR (FLU A&B, COVID) ARPGX2
Influenza A by PCR: NEGATIVE
Influenza B by PCR: NEGATIVE
SARS Coronavirus 2 by RT PCR: NEGATIVE

## 2021-10-08 LAB — SALICYLATE LEVEL: Salicylate Lvl: <7 mg/dL — ABNORMAL LOW (ref 7.0–30.0)

## 2021-10-08 LAB — POC URINE PREG, ED: Preg Test, Ur: NEGATIVE

## 2021-10-08 LAB — ACETAMINOPHEN LEVEL: Acetaminophen (Tylenol), Serum: <10 ug/mL — ABNORMAL LOW (ref 10–30)

## 2021-10-08 LAB — ETHANOL: Alcohol, Ethyl (B): 133 mg/dL — ABNORMAL HIGH (ref <10)

## 2021-10-08 MED ORDER — GABAPENTIN 100 MG PO CAPS
100.0000 mg | ORAL_CAPSULE | Freq: Once | ORAL | Status: AC
  Administered 2021-10-08: 100 mg via ORAL
  Filled 2021-10-08: qty 1

## 2021-10-08 MED ORDER — GABAPENTIN 100 MG PO CAPS
200.0000 mg | ORAL_CAPSULE | Freq: Three times a day (TID) | ORAL | Status: DC
  Administered 2021-10-08: 100 mg via ORAL
  Filled 2021-10-08 (×2): qty 2

## 2021-10-08 NOTE — ED Notes (Signed)
IVC paperwork faxed to magistrate.  
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
## 2021-10-08 NOTE — Progress Notes (Signed)
Per Suzzanne Cloud, patient meets criteria for inpatient treatment. There are no available or appropriate beds at [facility] today. CSW faxed referrals to the following facilities for review: ? ?[address] [place] [address] [phone] --  ?[place] N/A [address], [place] [address] [phone] --  ?[facility] [address] [place] [address] [phone] --  ?[facility]  Pending - Request Sent N/A [address] [place] [address] [phone] --  ?[place]  Pending - Request Sent N/A [address] [place] [address] [phone] --  ?[facility]  Pending - Request Sent N/A [address] [place] [address] [phone] --  ?[place] Sent N/A [address] [place] [address] [phone] --  ?[place] [address] [place] [address] [phone] --  ?[place] N/A [address], [place] [address] [phone] --  ?[facility]  Pending - Request Sent N/A [address] [place] [address] [phone] --  ?[facility]  Pending - Request Sent N/A [address] [place] [address] [phone] --  ?[facility]  Pending - Request Sent N/A [address] [place] [address] [phone] --  ?[place] N/A [address], [place] [address] [phone] --  ? ? ?TTS will continue to seek bed placement. ? ?Glennie [place], MSW, LCSW-A, LCAS-A ?Phone: [phone] ?Disposition/TOC ? ?

## 2021-10-08 NOTE — ED Notes (Signed)
TTS at this time. 

## 2021-10-08 NOTE — ED Notes (Signed)
A&O x 4. Calm, cooperative. Sitting up on side of stretcher eating dinner. Tolerating well. No complaints. No signs of distress. Sitter at bedside.  ?
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
## 2021-10-08 NOTE — Progress Notes (Signed)
Chelsea from [facility] contact [place] in reference to this patient. It was reported that she will contact nurse for further information to review this patient for placement. ? ?Glennie Isle, MSW, LCSW-A, LCAS-A ?Phone: [phone] ?Disposition/TOC ? ?

## 2021-10-08 NOTE — ED Notes (Addendum)
Alert and oriented x 4. Calm, cooperative. No signs of distress. Sitter at bedside.  ?

## 2021-10-08 NOTE — Progress Notes (Signed)
Per Chelsea,Admissions, pt has been accepted to [facility]. Accepting provider is Dr. Gaylene Brooks. Patient can arrive anytime. Number for report is [phone]. ? ?Glennie Isle, MSW, LCSW-A ?Phone: [phone] ?Disposition/TOC ? ? ?

## 2021-10-08 NOTE — ED Notes (Signed)
Pt belongings put into locker and wanded by security.  ?

## 2021-10-09 NOTE — ED Notes (Signed)
Warm blanket placed on pt while sleeping ?

## 2021-10-09 NOTE — ED Notes (Signed)
[employer] spoke to Security regarding pt's car that will be left in the parking lot. Security will make sure pt's car is not towed ?

## 2021-10-09 NOTE — ED Notes (Signed)
Pt left with [employer] to transport to [facility] ?

## 2021-10-09 NOTE — ED Provider Notes (Signed)
Patient accepted to [facility], Dr. Althia Forts, EMTALA completed.  ?  ?Truddie Hidden, MD ?10/09/21 [phone] ? ?

## 2021-10-17 ENCOUNTER — Ambulatory Visit: Payer: No Typology Code available for payment source | Admitting: Physician Assistant

## 2021-10-17 ENCOUNTER — Other Ambulatory Visit: Payer: Self-pay

## 2021-10-17 ENCOUNTER — Encounter: Payer: Self-pay | Admitting: Physician Assistant

## 2021-10-17 VITALS — BP 119/71 | HR 61 | Temp 97.5°F | Wt 105.0 lb

## 2021-10-17 DIAGNOSIS — R001 Bradycardia, unspecified: Secondary | ICD-10-CM

## 2021-10-17 DIAGNOSIS — R42 Dizziness and giddiness: Secondary | ICD-10-CM

## 2021-10-17 DIAGNOSIS — F17200 Nicotine dependence, unspecified, uncomplicated: Secondary | ICD-10-CM

## 2021-10-17 DIAGNOSIS — F32A Depression, unspecified: Secondary | ICD-10-CM

## 2021-10-17 NOTE — Progress Notes (Signed)
? ?BP 119/71   Pulse 61   Temp (!) 97.5 ?F (36.4 ?C)   Wt 105 lb (47.6 kg)   LMP 06/29/2018   SpO2 96%   BMI 19.84 kg/m?   ? ?Subjective:  ? ? Patient ID: Marissa Frank, female    DOB: 1970/12/17, 51 y.o.   MRN: [id number] ? ?HPI: ?Marissa Frank is a 51 y.o. female presenting on 10/17/2021 for Follow-up ? ? ?HPI ? ?Pt complains of Nausea and dizziness when she bends over.  This has been going since 2015.  She has had no syncopal episodes.    Most recent ekg in [place] 07/11/2018- NSR. Normal. ? ?She recently got out of [facility].  She was put on olanzapine.  She has not scheduled with psychiatrist yet.  ? ?She has a knot in her left breast.   That is at her biopsy site.  It is still sore.  She does not report any bleeding from the nipple.   ? ?She is still smoking.   ? ?She has a fiance since 2016.   ? ? ?Relevant past medical, surgical, family and social history reviewed and updated as indicated. Interim medical history since our last visit reviewed. ?Allergies and medications reviewed and updated. ? ? ?Current Outpatient Medications:  ?  acetaminophen (TYLENOL) 500 MG tablet, Take 500 mg by mouth every 6 (six) hours as needed., Disp: , Rfl:  ?  albuterol (PROVENTIL) (2.5 MG/3ML) 0.083% nebulizer solution, Take 3 mLs (2.5 mg total) by nebulization every 6 (six) hours as needed for wheezing or shortness of breath., Disp: 75 mL, Rfl: 6 ?  ferrous sulfate 325 (65 FE) MG tablet, Take 325 mg by mouth daily as needed (feeling weak)., Disp: , Rfl:  ?  gabapentin (NEURONTIN) 100 MG capsule, Take 3 capsules (300 mg total) by mouth 3 (three) times daily., Disp: 270 capsule, Rfl: 0 ?  OLANZapine (ZYPREXA) 2.5 MG tablet, Take 2.5 mg by mouth at bedtime., Disp: , Rfl:  ?  omeprazole (PRILOSEC) 40 MG capsule, Take 1 capsule (40 mg total) by mouth daily. 30 minutes before breakfast (Patient not taking: Reported on 09/12/2021), Disp: 90 capsule, Rfl: 3 ?  oxyCODONE (ROXICODONE) 5 MG immediate release  tablet, Take 1 tablet (5 mg total) by mouth every 4 (four) hours as needed. (Patient not taking: Reported on 10/08/2021), Disp: 20 tablet, Rfl: 0 ? ? ? ?Review of Systems ? ?Per HPI unless specifically indicated above ? ?   ?Objective:  ?  ?BP 119/71   Pulse 61   Temp (!) 97.5 ?F (36.4 ?C)   Wt 105 lb (47.6 kg)   LMP 06/29/2018   SpO2 96%   BMI 19.84 kg/m?   ?Wt Readings from Last 3 Encounters:  ?10/17/21 105 lb (47.6 kg)  ?10/08/21 103 lb (46.7 kg)  ?09/26/21 104 lb (47.2 kg)  ?   ? ? ?Orthostatic Vitals for the past 48 hrs (Last 6 readings): ? Patient Position Orthostatic BP Orthostatic Pulse  ?10/17/21 0953 Supine 107/65 70  ?10/17/21 0954 Sitting 110/73 66  ?10/17/21 0955 Standing 109/66 56  ?  ? ?Physical Exam ?Vitals reviewed.  ?Constitutional:   ?   General: She is not in acute distress. ?   Appearance: She is well-developed. She is not ill-appearing.  ?HENT:  ?   Head: Normocephalic and atraumatic.  ?Cardiovascular:  ?   Rate and Rhythm: Regular rhythm. Bradycardia present.  ?   Heart sounds: No murmur heard. ?  No friction rub.  No gallop.  ?Pulmonary:  ?   Effort: Pulmonary effort is normal.  ?   Breath sounds: Normal breath sounds.  ?Abdominal:  ?   General: Bowel sounds are normal.  ?   Palpations: Abdomen is soft. There is no mass.  ?   Tenderness: There is no abdominal tenderness.  ?Musculoskeletal:  ?   Cervical back: Neck supple.  ?   Right lower leg: No edema.  ?   Left lower leg: No edema.  ?Lymphadenopathy:  ?   Cervical: No cervical adenopathy.  ?Skin: ?   General: Skin is warm and dry.  ?Neurological:  ?   Mental Status: She is alert and oriented to person, place, and time.  ?   Motor: No weakness or tremor.  ?   Gait: Gait normal.  ?Psychiatric:     ?   Attention and Perception: Attention normal.     ?   Speech: Speech normal.     ?   Behavior: Behavior normal. Behavior is cooperative.  ? ? ? ?EKG-  SB at 52bpm.  No changes compared with EKG from 2019. ? ?   ?Assessment & Plan:   ? ? ?Encounter Diagnoses  ?Name Primary?  ? Dizziness Yes  ? Tobacco use disorder   ? Depression, unspecified depression type   ? Bradycardia   ? ? ? ?-Labs done 10/08/21 reviewed ? ?-Encouraged smoking cessation ? ?-Refer to cardiology for symptomatic bradycardia/orthostasis ? ?-Pt encouraged to get established with psychiatrist.  Discussed that counseling is available at [facility] with [facility] which was offered but she wants to see counselor where she sees psychiatrist ? ?-pt to follow up with surgeon as scheduled for her follow up breast biopsy with persistent pain ? ?-pt to follow up here 2 months.  She is to contact office sooner prn ? ? ? ? ?

## 2021-10-19 ENCOUNTER — Telehealth: Payer: Self-pay

## 2021-10-19 NOTE — Telephone Encounter (Signed)
Attempted follow up call after recent ER visit. No answer, left message. ? ? ?Debria Garret RN ?[employer] ?

## 2021-10-25 ENCOUNTER — Ambulatory Visit: Payer: No Typology Code available for payment source | Admitting: Licensed Clinical Social Worker

## 2021-10-25 ENCOUNTER — Other Ambulatory Visit: Payer: Self-pay

## 2021-10-25 DIAGNOSIS — F32A Depression, unspecified: Secondary | ICD-10-CM

## 2021-10-25 NOTE — Progress Notes (Signed)
[facility] engaged patient in initial [facility] session. [facility] provided active listening as patient shared about stressors, symptoms, and past trauma. [facility] reviewed patient's coping mechanisms. No SI/HI. Treatment will begin with monthly sessions, as requested by patient.  ?

## 2021-11-07 ENCOUNTER — Ambulatory Visit: Payer: Self-pay | Admitting: General Surgery

## 2021-11-14 ENCOUNTER — Ambulatory Visit (INDEPENDENT_AMBULATORY_CARE_PROVIDER_SITE_OTHER): Payer: Self-pay | Admitting: General Surgery

## 2021-11-14 ENCOUNTER — Other Ambulatory Visit: Payer: Self-pay

## 2021-11-14 ENCOUNTER — Encounter: Payer: Self-pay | Admitting: General Surgery

## 2021-11-14 VITALS — BP 131/94 | HR 73 | Temp 98.5°F | Resp 14 | Ht 61.0 in | Wt 104.0 lb

## 2021-11-14 DIAGNOSIS — M792 Neuralgia and neuritis, unspecified: Secondary | ICD-10-CM

## 2021-11-14 MED ORDER — GABAPENTIN 100 MG PO CAPS: 100.0000 mg | ORAL_CAPSULE | Freq: Three times a day (TID) | ORAL | 0 refills | Status: DC

## 2021-11-14 NOTE — Patient Instructions (Signed)
Continue to take the gabapentin and see if weaning off of it is possible in the next 2 months. If not, you may need to be on it more permanently and Ms. McElroy PA can continue that for you longterm.  ?

## 2021-11-14 NOTE — Progress Notes (Signed)
[facility] ? ?Doing better. Taking gabapentin. Says less sensitive but still has issues with hypersensitivity with touching and lying on it.  ? ?BP (!) 131/94   Pulse 73   Temp 98.5 ?F (36.9 ?C) (Other (Comment))   Resp 14   Ht '5\' 1"'$  (1.549 m)   Wt 104 lb (47.2 kg)   LMP 06/29/2018   SpO2 96%   BMI 19.65 kg/m?  ?Left breast incision healed, seroma/ post surgical changes noted  ? ? ?Patient s/p breast excision and having neuropathic pain. Will refill again gabapentin 100 mg TID and see if she can wean. If not she can continue on this for longer with her PCP. ?Will see her in June to further discuss and determine plan ?Annual mammogram ? ?Future Appointments  ?Date Time Provider [place]  ?11/21/2021  9:30 AM Annitta Needs, NP RGA-RGA RGA  ?11/22/2021  9:00 AM Marcelina Morel, LCSWA FCRC-FCRC None  ?12/18/2021 10:00 AM Soyla Dryer, PA-C FCRC-FCRC None  ?01/16/2022  9:15 AM Virl Cagey, MD RS-RS None  ? ?Curlene Labrum, MD ?[facility] ?[place], [place] [address] ?[phone] (office) ? ?

## 2021-11-21 ENCOUNTER — Ambulatory Visit (INDEPENDENT_AMBULATORY_CARE_PROVIDER_SITE_OTHER): Payer: Self-pay | Admitting: Gastroenterology

## 2021-11-21 ENCOUNTER — Encounter: Payer: Self-pay | Admitting: Internal Medicine

## 2021-11-21 ENCOUNTER — Other Ambulatory Visit (HOSPITAL_COMMUNITY): Payer: Self-pay | Admitting: Obstetrics and Gynecology

## 2021-11-21 ENCOUNTER — Encounter: Payer: Self-pay | Admitting: Gastroenterology

## 2021-11-21 VITALS — BP 122/74 | HR 72 | Temp 97.6°F | Ht 61.0 in | Wt 108.2 lb

## 2021-11-21 DIAGNOSIS — Z1231 Encounter for screening mammogram for malignant neoplasm of breast: Secondary | ICD-10-CM

## 2021-11-21 DIAGNOSIS — K219 Gastro-esophageal reflux disease without esophagitis: Secondary | ICD-10-CM

## 2021-11-21 DIAGNOSIS — K625 Hemorrhage of anus and rectum: Secondary | ICD-10-CM

## 2021-11-21 MED ORDER — OMEPRAZOLE 40 MG PO CPDR: 40.0000 mg | DELAYED_RELEASE_CAPSULE | Freq: Every day | ORAL | 3 refills | Status: DC

## 2021-11-21 NOTE — Patient Instructions (Signed)
Today only to get relief: take 1 capful of Miralax in 8 ounces of water every hour up to 6 doses. You can repeat tomorrow if you need to. This is NOT an every day regimen; this is just to get you moving again. ? ?Once you've cleaned out, start taking 2 of the Linzess capsules each day (this is 2 of the Linzess 72 micrograms). We do not have any of the highest dosage. ? ?Please ask the [facility] tomorrow what is going on with the [place] approval for patient assistance. It is important we get you back on this. Let us know what they say. ? ?Use the rectal cream twice a day.  ? ?I will see you back for banding! ? ?I enjoyed seeing you again today! As you know, I value our relationship and want to provide genuine, compassionate, and quality care. I welcome your feedback. If you receive a survey regarding your visit,  I greatly appreciate you taking time to fill this out. See you next time! ? ?Annitta Needs, PhD, ANP-BC ?[facility]  ? ?

## 2021-11-21 NOTE — Progress Notes (Signed)
? ? ?Gastroenterology Office Note   ? ? ?Primary Care Physician:  Soyla Dryer, PA-C  ?Primary Gastroenterologist: Dr. Gala Romney  ? ? ?Chief Complaint  ? ?Chief Complaint  ?Patient presents with  ? Follow-up  ?  Constipation sometimes   ? ? ? ?History of Present Illness  ? ?Marissa Frank is a 51 y.o. female presenting today in follow-up with a history of PUD in 2019 s/p EGD last year with antral erosions, no persistent ulcerative disease.  ? ?Believes she took the patient assistance forms to the free clinic. Has done well with Linzess 290 mcg daily. Miralax takes about a week to work.  ? ?Ran out of omeprazole. Needs refills.  ? ?Symptomatic hemorrhoids. Colonoscopy on file from 2022.  ? ? ? ?Past Medical History:  ?Diagnosis Date  ? Asthma   ? Cancer [facility])   ? cervical  ? Depression   ? Hemorrhoids   ? ? ?Past Surgical History:  ?Procedure Laterality Date  ? BIOPSY  04/24/2018  ? Procedure: BIOPSY;  Surgeon: Daneil Dolin, MD;  [facility];  Service: Endoscopy;;  ? BIOPSY  01/20/2021  ? Procedure: BIOPSY;  Surgeon: Daneil Dolin, MD;  [facility];  Service: Endoscopy;;  gastric  ? BREAST BIOPSY Left 08/25/2021  ? Procedure: BREAST BIOPSY WITH DUCT EXCISIONS;  Surgeon: Virl Cagey, MD;  Location: [facility] OR[facility];  Service: General;  Laterality: Left;  ? BREAST SURGERY    ? benign tumor  ? CERVICAL [place]E BIOPSY    ? COLONOSCOPY WITH PROPOFOL N/A 01/20/2021  ? hemorrhoids, one 5 mm polyp in sigmoid colon, simgoid and descending colon diverticulosis. leiomyoma  ? ESOPHAGOGASTRODUODENOSCOPY (EGD) WITH PROPOFOL N/A 04/24/2018  ? reflux esophagitis s/p dilation, small hiatal hernia, multiple small gastric ulcers s/p biopsy, reactive gastropathy, no erosions. +NSAIDs at that time.  ? ESOPHAGOGASTRODUODENOSCOPY (EGD) WITH PROPOFOL N/A 01/20/2021  ? normal esophagus s/p dilation, small hiatal hernia, antral erosions s/p biopsy. Normal duodenum.  ? MALONEY DILATION N/A 04/24/2018  ? Procedure:  MALONEY DILATION;  Surgeon: Daneil Dolin, MD;  [facility];  Service: Endoscopy;  Laterality: N/A;  ? MALONEY DILATION N/A 01/20/2021  ? Procedure: MALONEY DILATION;  Surgeon: Daneil Dolin, MD;  [facility];  Service: Endoscopy;  Laterality: N/A;  ? POLYPECTOMY  01/20/2021  ? Procedure: POLYPECTOMY;  Surgeon: Daneil Dolin, MD;  [facility];  Service: Endoscopy;;  ? TUBAL LIGATION    ? WISDOM TOOTH EXTRACTION    ? ? ?Current Outpatient Medications  ?Medication Sig Dispense Refill  ? acetaminophen (TYLENOL) 500 MG tablet Take 500 mg by mouth every 6 (six) hours as needed.    ? albuterol (PROVENTIL) (2.5 MG/3ML) 0.083% nebulizer solution Take 3 mLs (2.5 mg total) by nebulization every 6 (six) hours as needed for wheezing or shortness of breath. 75 mL 6  ? ferrous sulfate 325 (65 FE) MG tablet Take 325 mg by mouth daily as needed (feeling weak).    ? gabapentin (NEURONTIN) 100 MG capsule Take 1 capsule (100 mg total) by mouth 3 (three) times daily. 270 capsule 0  ? OLANZapine (ZYPREXA) 2.5 MG tablet Take 2.5 mg by mouth at bedtime.    ? omeprazole (PRILOSEC) 40 MG capsule Take 1 capsule (40 mg total) by mouth daily. 30 minutes before breakfast (Patient not taking: Reported on 09/12/2021) 90 capsule 3  ? ?No current facility-administered medications for this visit.  ? ? ?Allergies as of 11/21/2021 -  Review Complete 11/21/2021  ?Allergen Reaction Noted  ? Almond oil Itching 03/30/2011  ? Carrot [daucus carota] Itching 03/16/2015  ? Celery oil Itching 03/16/2015  ? Coconut flavor Nausea And Vomiting and Other (See Comments) 03/30/2011  ? Macadamia nut oil Itching 03/30/2011  ? ? ?Family History  ?Problem Relation Age of Onset  ? Hypertension Mother   ? Stroke Mother   ? Colon cancer Neg Hx   ? Colon polyps Neg Hx   ? Stomach cancer Neg Hx   ? Liver disease Neg Hx   ? ? ?Social History  ? ?Socioeconomic History  ? Marital status: Single  ?  Spouse name: Not on file  ? Number of  children: Not on file  ? Years of education: Not on file  ? Highest education level: Not on file  ?Occupational History  ? Not on file  ?Tobacco Use  ? Smoking status: Every Day  ?  Packs/day: 0.50  ?  Types: Cigarettes  ? Smokeless tobacco: Never  ?Vaping Use  ? Vaping Use: Former  ?Substance and Sexual Activity  ? Alcohol use: Yes  ?  Alcohol/week: 7.0 standard drinks  ?  Types: 7 Glasses of wine per week  ?  Comment: 05/23/21-glass of wine nightly  ? Drug use: Not Currently  ?  Types: Marijuana  ?  Comment: none since teenager  ? Sexual activity: Yes  ?  Birth control/protection: Surgical, Post-menopausal  ?Other Topics Concern  ? Not on file  ?Social History Narrative  ? Not on file  ? ?Social Determinants of Health  ? ?Financial Resource Strain: Not on file  ?Food Insecurity: No Food Insecurity  ? Worried About Charity fundraiser in the Last Year: Never true  ? Ran Out of Food in the Last Year: Never true  ?Transportation Needs: No Transportation Needs  ? Lack of Transportation (Medical): No  ? Lack of Transportation (Non-Medical): No  ?Physical Activity: Not on file  ?Stress: Not on file  ?Social Connections: Not on file  ?Intimate Partner Violence: Not on file  ? ? ? ?Review of Systems  ? ?Gen: Denies any fever, chills, fatigue, weight loss, lack of appetite.  ?CV: Denies chest pain, heart palpitations, peripheral edema, syncope.  ?Resp: Denies shortness of breath at rest or with exertion. Denies wheezing or cough.  ?GI: see HPI ?GU : Denies urinary burning, urinary frequency, urinary hesitancy ?MS: Denies joint pain, muscle weakness, cramps, or limitation of movement.  ?Derm: Denies rash, itching, dry skin ?Psych: Denies depression, anxiety, memory loss, and confusion ?Heme: Denies bruising, bleeding, and enlarged lymph nodes. ? ? ?Physical Exam  ? ?BP 122/74   Pulse 72   Temp 97.6 ?F (36.4 ?C) (Temporal)   Ht '5\' 1"'$  (1.549 m)   Wt 108 lb 3.2 oz (49.1 kg)   LMP 06/29/2018   BMI 20.44 kg/m?  ?General:    Alert and oriented. Pleasant and cooperative. Well-nourished and well-developed.  ?Head:  Normocephalic and atraumatic. ?Eyes:  Without icterus ?Abdomen:  +BS, soft, non-tender and non-distended. No HSM noted. No guarding or rebound. No masses appreciated.  ?Rectal:  Deferred  ?Msk:  Symmetrical without gross deformities. Normal posture. ?Extremities:  Without edema. ?Neurologic:  Alert and  oriented x4;  grossly normal neurologically. ?Skin:  Intact without significant lesions or rashes. ?Psych:  Alert and cooperative. Normal mood and affect. ? ? ?Assessment  ? ?Marissa Frank is a 51 y.o. female presenting today in follow-up with a history of PUD in  2019, GERD, chronic constipation, and now with symptomatic hemorrhoids.  ? ?GERD: refill omeprazole. Doing well on this. ? ?Constipation: Miralax purge today and repeat tomorrow if needed. Linzess 290 mcg through the [facility] patient assistance. Samples provided in interim. ? ?Symptomatic hemorrhoids: anusol BID. Arrange banding in near future.  ? ?PLAN  ? ? ?Omeprazole daily ?Miralax purge ?Linzess 290 mcg daily: reach out to patient assistsance regarding this ?Ansuol BID ?Return for banding ? ? ?Annitta Needs, PhD, ANP-BC ?[facility]  ? ? ?

## 2021-11-22 ENCOUNTER — Ambulatory Visit: Payer: No Typology Code available for payment source | Admitting: Licensed Clinical Social Worker

## 2021-11-24 ENCOUNTER — Telehealth: Payer: Self-pay

## 2021-11-24 NOTE — Telephone Encounter (Signed)
Pt returned call and address was updated and the phone number where we can leave messages due to pt's phone being off. Pt advised me that the [facility] needs a new Rx from [place] and pt assistance forms filed out. I advised the pt this would be handled the beginning of next week and she was ok with that because the [facility] is closed today. I want to make sure they do not have a particular form that they use. ?

## 2021-11-24 NOTE — Telephone Encounter (Signed)
FYI: ? ? ?Phoned the pt @ [phone] and LMOVM for the pt to return call. (Pt had LMOVM from this phone number [phone]. Phone the [phone] number and the vm was not set up. Pt still has the same address listed as before when I sent out paperwork and she didn't advise [place] of her new address. All demographics needs to be reviewed before pt assistance can be done ?

## 2021-11-27 NOTE — Telephone Encounter (Signed)
Letter and application mailed out to the pt. Pt advised to return to [place] and we will do the rest ?

## 2021-11-27 NOTE — Telephone Encounter (Signed)
Phoned the [facility] @ [phone]. Spoke with Alyse Low and advised her that we needed a form from them to fill out for the pt's Linzess. She advised me that the pt had been told that they do not prescribe Linzess and that her GI doctors need to do that. She advised me that they will help the pt fill out the form but that's all they can do. So I thanked her for her time and I will do a letter with instructions for the pt and send it along with the application to fill out and return to [place] so we can fax it with a new Rx. ?

## 2021-11-29 ENCOUNTER — Ambulatory Visit: Payer: No Typology Code available for payment source | Admitting: Licensed Clinical Social Worker

## 2021-11-29 DIAGNOSIS — F419 Anxiety disorder, unspecified: Secondary | ICD-10-CM

## 2021-11-29 DIAGNOSIS — F32A Depression, unspecified: Secondary | ICD-10-CM

## 2021-11-29 NOTE — Progress Notes (Signed)
[facility] engaged patient in follow-up session. [facility] provided active listening and validation as patient shared about anxiety and depression related to health issues and stressors. [facility] led patient in mindfulness exercise. ?

## 2021-11-30 ENCOUNTER — Ambulatory Visit (HOSPITAL_COMMUNITY)
Admission: RE | Admit: 2021-11-30 | Discharge: 2021-11-30 | Disposition: A | Discharge status: Home / Self Care | Payer: Self-pay | Source: Ambulatory Visit | Attending: Obstetrics and Gynecology | Admitting: Obstetrics and Gynecology

## 2021-11-30 DIAGNOSIS — Z1231 Encounter for screening mammogram for malignant neoplasm of breast: Secondary | ICD-10-CM | POA: Insufficient documentation

## 2021-12-01 ENCOUNTER — Other Ambulatory Visit (HOSPITAL_COMMUNITY): Payer: Self-pay | Admitting: Obstetrics and Gynecology

## 2021-12-01 DIAGNOSIS — R928 Other abnormal and inconclusive findings on diagnostic imaging of breast: Secondary | ICD-10-CM

## 2021-12-05 ENCOUNTER — Encounter: Payer: Self-pay | Admitting: Physician Assistant

## 2021-12-05 NOTE — Progress Notes (Signed)
Pt's 11-30-21 screening mammogram recommends for pt to have Diagnostic mammogram and possibly ultrasound of the left breast. ? ?Routing this to [facility] ?

## 2021-12-06 NOTE — Telephone Encounter (Signed)
Pt sent back her application for [employer] for Linzess. I will place it on your desk for  you to fill out Rx and to sign so I can fax. ?

## 2021-12-12 ENCOUNTER — Ambulatory Visit (HOSPITAL_COMMUNITY)
Admission: RE | Admit: 2021-12-12 | Discharge: 2021-12-12 | Disposition: A | Discharge status: Home / Self Care | Payer: Self-pay | Source: Ambulatory Visit | Attending: Obstetrics and Gynecology | Admitting: Obstetrics and Gynecology

## 2021-12-12 DIAGNOSIS — R928 Other abnormal and inconclusive findings on diagnostic imaging of breast: Secondary | ICD-10-CM

## 2021-12-18 ENCOUNTER — Ambulatory Visit: Payer: No Typology Code available for payment source | Admitting: Physician Assistant

## 2021-12-21 ENCOUNTER — Ambulatory Visit (INDEPENDENT_AMBULATORY_CARE_PROVIDER_SITE_OTHER): Payer: Self-pay | Admitting: Gastroenterology

## 2021-12-21 ENCOUNTER — Encounter: Payer: Self-pay | Admitting: Gastroenterology

## 2021-12-21 VITALS — BP 98/62 | HR 91 | Temp 97.5°F | Ht 61.0 in | Wt 104.8 lb

## 2021-12-21 DIAGNOSIS — K642 Third degree hemorrhoids: Secondary | ICD-10-CM | POA: Insufficient documentation

## 2021-12-21 NOTE — Progress Notes (Signed)
    [place] BANDING PROCEDURE NOTE  Marissa Frank is a 51 y.o. female presenting today for consideration of hemorrhoid banding. Last colonoscopy June 2022. Symptoms of bleeding and prolapse.    The patient presents with symptomatic grade 3 hemorrhoids, unresponsive to maximal medical therapy, requesting rubber band ligation of his/her hemorrhoidal disease. All risks, benefits, and alternative forms of therapy were described and informed consent was obtained.  The decision was made to band the left lateral internal hemorrhoid, and the [place] was used to perform band ligation without complication. Digital anorectal examination was then performed to assure proper positioning of the band, and to adjust the banded tissue as required. The patient was discharged home without pain or other issues. Dietary and behavioral recommendations were given, along with follow-up instructions. The patient will return in several weeks for followup and possible additional banding as required. I have provided samples of Linzess 290 mcg until she can obtain Medicaid.   No complications were encountered and the patient tolerated the procedure well.   Annitta Needs, PhD, ANP-BC [facility]

## 2021-12-21 NOTE — Patient Instructions (Signed)
Please continue to avoid straining.  You should limit your toilet time to 2-3 minutes at the most.   Continue to avoid constipation. I have given samples of Linzess till we can get this covered by new insurance.   Please call me with any concerns or issues!  I will see you in follow-up for additional banding in several weeks.

## 2022-01-03 ENCOUNTER — Ambulatory Visit: Payer: No Typology Code available for payment source | Admitting: Licensed Clinical Social Worker

## 2022-01-03 DIAGNOSIS — F32A Depression, unspecified: Secondary | ICD-10-CM

## 2022-01-03 DIAGNOSIS — F419 Anxiety disorder, unspecified: Secondary | ICD-10-CM

## 2022-01-03 NOTE — Progress Notes (Signed)
[facility] engaged patient in follow-up/termination session. [facility] provided reflective listening and validation as patient shared about anger related to various stressors. [facility] led patient in visualization breathing exercise. [facility] informed patient that he was transitioning out of [place] clinic in near future, [facility] assisted patient in processing her thoughts and feelings related. Patient interested in continued [facility] services.

## 2022-01-04 ENCOUNTER — Encounter: Payer: Self-pay | Admitting: Gastroenterology

## 2022-01-04 ENCOUNTER — Ambulatory Visit (INDEPENDENT_AMBULATORY_CARE_PROVIDER_SITE_OTHER): Payer: Self-pay | Admitting: Gastroenterology

## 2022-01-04 VITALS — BP 124/84 | HR 81 | Temp 97.5°F | Ht 61.0 in | Wt 104.2 lb

## 2022-01-04 DIAGNOSIS — K642 Third degree hemorrhoids: Secondary | ICD-10-CM

## 2022-01-04 NOTE — Progress Notes (Signed)
    [place] BANDING PROCEDURE NOTE  Marissa Frank is a 51 y.o. female presenting today for consideration of hemorrhoid banding. Last colonoscopy June 2022. She has had left lateral banding.    The patient presents with symptomatic grade 3 hemorrhoids, unresponsive to maximal medical therapy, requesting rubber band ligation of her hemorrhoidal disease. All risks, benefits, and alternative forms of therapy were described and informed consent was obtained.  The decision was made to band the right posterior internal hemorrhoid, and the [place] was used to perform band ligation without complication. Digital anorectal examination was then performed to assure proper positioning of the band, and to adjust the banded tissue as required. The patient was discharged home without pain or other issues. Dietary and behavioral recommendations were given, along with follow-up instructions. The patient will return in several weeks for followup and possible additional banding as required.  No complications were encountered and the patient tolerated the procedure well.   Annitta Needs, PhD, ANP-BC [facility]

## 2022-01-04 NOTE — Patient Instructions (Signed)
Please continue to avoid straining.  You should limit your toilet time to 2-3 minutes at the most.   Continue to avoid constipation.  Please call me with any concerns or issues!  I will see you in follow-up for additional banding in several weeks.    I enjoyed seeing you again today! As you know, I value our relationship and want to provide genuine, compassionate, and quality care. I welcome your feedback. If you receive a survey regarding your visit,  I greatly appreciate you taking time to fill this out. See you next time!  [place] W. Cillian Gwinner, PhD, ANP-BC [facility]       

## 2022-01-16 ENCOUNTER — Ambulatory Visit: Payer: Self-pay | Admitting: General Surgery

## 2022-01-18 ENCOUNTER — Ambulatory Visit (INDEPENDENT_AMBULATORY_CARE_PROVIDER_SITE_OTHER): Payer: Medicaid Other | Admitting: Gastroenterology

## 2022-01-18 ENCOUNTER — Encounter: Payer: Self-pay | Admitting: Gastroenterology

## 2022-01-18 ENCOUNTER — Encounter: Payer: Self-pay | Admitting: *Deleted

## 2022-01-18 ENCOUNTER — Ambulatory Visit: Payer: Medicaid Other | Admitting: General Surgery

## 2022-01-18 VITALS — BP 108/64 | HR 92 | Temp 97.3°F | Ht 61.0 in | Wt 106.0 lb

## 2022-01-18 DIAGNOSIS — K642 Third degree hemorrhoids: Secondary | ICD-10-CM | POA: Diagnosis not present

## 2022-01-18 NOTE — Progress Notes (Signed)
    [place] BANDING PROCEDURE NOTE  Marissa Frank is a 50 y.o. female presenting today for consideration of hemorrhoid banding. Last colonoscopy June 2022. She has had left lateral and right posterior banding. Continues with prolapsing, bleeding, soiling.    The patient presents with symptomatic grade 3 hemorrhoids, unresponsive to maximal medical therapy, requesting rubber band ligation of his/her hemorrhoidal disease. All risks, benefits, and alternative forms of therapy were described and informed consent was obtained.  The decision was made to band the right anterior internal hemorrhoid, and the [place] was used to perform band ligation without complication. Digital anorectal examination was then performed to assure proper positioning of the band, and to adjust the banded tissue as required. The patient was discharged home without pain or other issues. Dietary and behavioral recommendations were given, along with follow-up instructions. The patient will return in several weeks for followup and possible additional banding as required.  No complications were encountered and the patient tolerated the procedure well.   Annitta Needs, PhD, ANP-BC [facility]

## 2022-01-18 NOTE — Patient Instructions (Signed)
Please continue to avoid straining.  You should limit your toilet time to 2-3 minutes at the most.   Continue to avoid constipation.  Please call me with any concerns or issues!  I will see you in follow-up for additional banding in several weeks.    I enjoyed seeing you again today! As you know, I value our relationship and want to provide genuine, compassionate, and quality care. I welcome your feedback. If you receive a survey regarding your visit,  I greatly appreciate you taking time to fill this out. See you next time!  Ritha Sampedro W. Tawonda Legaspi, PhD, ANP-BC [facility]       

## 2022-02-01 ENCOUNTER — Encounter: Payer: Self-pay | Admitting: Gastroenterology

## 2022-02-01 ENCOUNTER — Ambulatory Visit (INDEPENDENT_AMBULATORY_CARE_PROVIDER_SITE_OTHER): Payer: Medicaid Other | Admitting: Gastroenterology

## 2022-02-01 VITALS — BP 123/75 | HR 58 | Temp 97.7°F | Ht 61.0 in | Wt 102.0 lb

## 2022-02-01 DIAGNOSIS — K642 Third degree hemorrhoids: Secondary | ICD-10-CM | POA: Diagnosis not present

## 2022-02-01 MED ORDER — LINACLOTIDE 290 MCG PO CAPS
290.0000 ug | ORAL_CAPSULE | Freq: Every day | ORAL | 3 refills | Status: DC
Start: 2022-02-01 — End: 2023-05-10

## 2022-02-01 NOTE — Patient Instructions (Signed)
Please continue to avoid straining.  You should limit your toilet time to 2-3 minutes at the most.   Continue to avoid constipation.  Please call me with any concerns or issues!  I will see you in follow-up as needed! Please call me if you need any additional banding!  I enjoyed seeing you again today! As you know, I value our relationship and want to provide genuine, compassionate, and quality care. I welcome your feedback. If you receive a survey regarding your visit,  I greatly appreciate you taking time to fill this out. See you next time!  Annitta Needs, PhD, ANP-BC [facility]

## 2022-02-01 NOTE — Progress Notes (Signed)
She has had all 3 columns banded.      [place] BANDING PROCEDURE NOTE  Marissa Frank is a 51 y.o. female presenting today for consideration of hemorrhoid banding. Last colonoscopy June 2022. She has had banding of all 3 columns. Still with occasional bleeding. Prolonged toilet time as not taking Linzess every day.    The patient presents with symptomatic grade 3 hemorrhoids, unresponsive to maximal medical therapy, requesting rubber band ligation of her hemorrhoidal disease. All risks, benefits, and alternative forms of therapy were described and informed consent was obtained.  The decision was made to band neutrally, and the [place] was used to perform band ligation without complication. Digital anorectal examination was then performed to assure proper positioning of the band, and to adjust the banded tissue as required. The patient was discharged home without pain or other issues. Dietary and behavioral recommendations were given, along with follow-up instructions. The patient will return as needed. I have given samples of Linzess 290 mcg and sent prescription to pharmacy.   No complications were encountered and the patient tolerated the procedure well.   Annitta Needs, PhD, ANP-BC [facility]

## 2022-02-08 ENCOUNTER — Ambulatory Visit: Payer: Medicaid Other | Admitting: General Surgery

## 2022-02-19 ENCOUNTER — Encounter: Payer: Self-pay | Admitting: Cardiology

## 2022-02-19 NOTE — Progress Notes (Unsigned)
Cardiology Office Note  Date: 02/21/2022   ID: Marissa Frank, DOB 1971/01/18, MRN [id number]  PCP:  Marissa Dryer, PA-C  Cardiologist:  Marissa Lesches, MD Electrophysiologist:  None   Chief Complaint  Patient presents with   Referred for evaluation of bradycardia    History of Present Illness: Marissa Frank is a 51 y.o. female referred for cardiology consultation by Ms. McElroy PA-C for the evaluation of bradycardia.  I reviewed the office note from March.  We discussed her symptoms today.  Actually, she tells me that for the last few years she has been experiencing an unusual feeling when she bends over and then leans back up, such as when she drops something on the floor.  She states that her "eyes roll back" and she feels lightheaded, heart rate goes up, and then symptoms settle down.  Not entirely clear that she is frankly syncopal during these times.  This never occurs when she is standing up or when she is driving.  I reviewed her medications which are outlined below.  I also reviewed the ECG obtained back in March as noted below.  She has no history of cardiac arrhythmia.  Orthostatic measurements were made today and were normal.  Past Medical History:  Diagnosis Date   Asthma    Cervical cancer ([place])    Depression    Hemorrhoids     Past Surgical History:  Procedure Laterality Date   BIOPSY  04/24/2018   Procedure: BIOPSY;  Surgeon: Daneil Dolin, MD;  [facility];  Service: Endoscopy;;   BIOPSY  01/20/2021   Procedure: BIOPSY;  Surgeon: Daneil Dolin, MD;  [facility];  Service: Endoscopy;;  gastric   BREAST BIOPSY Left 08/25/2021   Procedure: BREAST BIOPSY WITH DUCT EXCISIONS;  Surgeon: Virl Cagey, MD;  Location: [facility];  Service: General;  Laterality: Left;   BREAST SURGERY     benign tumor   CERVICAL [place]E BIOPSY     COLONOSCOPY WITH PROPOFOL N/A 01/20/2021   hemorrhoids, one 5 mm polyp in sigmoid colon,  simgoid and descending colon diverticulosis. leiomyoma   ESOPHAGOGASTRODUODENOSCOPY (EGD) WITH PROPOFOL N/A 04/24/2018   reflux esophagitis s/p dilation, small hiatal hernia, multiple small gastric ulcers s/p biopsy, reactive gastropathy, no erosions. +NSAIDs at that time.   ESOPHAGOGASTRODUODENOSCOPY (EGD) WITH PROPOFOL N/A 01/20/2021   normal esophagus s/p dilation, small hiatal hernia, antral erosions s/p biopsy. Normal duodenum.   MALONEY DILATION N/A 04/24/2018   Procedure: Venia Minks DILATION;  Surgeon: Daneil Dolin, MD;  [facility];  Service: Endoscopy;  Laterality: N/A;   MALONEY DILATION N/A 01/20/2021   Procedure: Venia Minks DILATION;  Surgeon: Daneil Dolin, MD;  [facility];  Service: Endoscopy;  Laterality: N/A;   POLYPECTOMY  01/20/2021   Procedure: POLYPECTOMY;  Surgeon: Daneil Dolin, MD;  [facility];  Service: Endoscopy;;   TUBAL LIGATION     WISDOM TOOTH EXTRACTION      Current Outpatient Medications  Medication Sig Dispense Refill   acetaminophen (TYLENOL) 500 MG tablet Take 500 mg by mouth every 6 (six) hours as needed.     albuterol (PROVENTIL) (2.5 MG/3ML) 0.083% nebulizer solution Take 3 mLs (2.5 mg total) by nebulization every 6 (six) hours as needed for wheezing or shortness of breath. 75 mL 6   ferrous sulfate 325 (65 FE) MG tablet Take 325 mg by mouth daily as needed (feeling weak).     gabapentin (NEURONTIN)  100 MG capsule Take 1 capsule (100 mg total) by mouth 3 (three) times daily. 270 capsule 0   linaclotide (LINZESS) 290 MCG CAPS capsule Take 1 capsule (290 mcg total) by mouth daily before breakfast. 90 capsule 3   OLANZapine (ZYPREXA) 2.5 MG tablet Take 2.5 mg by mouth at bedtime.     omeprazole (PRILOSEC) 40 MG capsule Take 1 capsule (40 mg total) by mouth daily. 30 minutes before breakfast 90 capsule 3   No current facility-administered medications for this visit.   Allergies:  Almond oil, Carrot [daucus carota],  Celery oil, Coconut flavor, and Macadamia nut oil   Social History: The patient  reports that she has been smoking cigarettes. She has been smoking an average of .25 packs per day. She has never used smokeless tobacco. She reports current alcohol use of about 7.0 standard drinks of alcohol per week. She reports that she does not currently use drugs after having used the following drugs: Marijuana.   Family History: The patient's family history includes Hypertension in her mother; Stroke in her mother.   ROS:  No exertional chest pain.  Physical Exam: VS:  BP 118/80   Pulse 82   Ht '5\' 1"'$  (1.549 m)   Wt 100 lb 9.6 oz (45.6 kg)   LMP 06/29/2018   SpO2 98%   BMI 19.01 kg/m , BMI Body mass index is 19.01 kg/m.  Wt Readings from Last 3 Encounters:  02/21/22 100 lb 9.6 oz (45.6 kg)  02/01/22 102 lb (46.3 kg)  01/18/22 106 lb (48.1 kg)    General: Patient appears comfortable at rest. HEENT: Conjunctiva and lids normal, oropharynx clear. Neck: Supple, no elevated JVP or carotid bruits, no thyromegaly. Lungs: Clear to auscultation, nonlabored breathing at rest. Cardiac: Regular rate and rhythm, no S3, 1/6 systolic murmur, no pericardial rub. Abdomen: Soft, nontender, bowel sounds present. Extremities: No pitting edema, distal pulses 2+. Skin: Warm and dry. Musculoskeletal: No kyphosis. Neuropsychiatric: Alert and oriented x3, affect grossly appropriate.  ECG:  An ECG dated 10/17/2021 was personally reviewed today and demonstrated:  Sinus bradycardia at 52 beats per minute with PACs.  Recent Labwork: 10/08/2021: ALT 22; AST 36; BUN 7; Creatinine, Ser 0.64; Hemoglobin 14.0; Platelets 425; Potassium 4.2; Sodium 141     Component Value Date/Time   CHOL 242 (H) 11/03/2020 1215   TRIG 80 11/03/2020 1215   HDL 108 11/03/2020 1215   CHOLHDL 2.2 11/03/2020 1215   VLDL 16 11/03/2020 1215   LDLCALC 118 (H) 11/03/2020 1215    Other Studies Reviewed Today:  Chest x-ray  04/03/2021: FINDINGS: The heart size and mediastinal contours are within normal limits. Both lungs are clear. The visualized skeletal structures are unremarkable. No visible focal rib lesion.   IMPRESSION: No active cardiopulmonary disease.  Assessment and Plan:  Positional spells as outlined above.  Does not appear to be any specific correlation to bradycardia in and of itself at least based on today's orthostatic measurements which were normal.  She has a soft cardiac murmur which is most likely benign, but has not undergone a prior echocardiogram.  I did review her ECG from March when she mentioned these symptoms to her PCP.  Plan to obtain a 72-hour Zio patch to assess heart rate variability more carefully, also an echocardiogram to ensure structurally normal heart.  No changes were made in therapy.  Recommended that she hydrate adequately.  Medication Adjustments/Labs and Tests Ordered: Current medicines are reviewed at length with the patient today.  Concerns  regarding medicines are outlined above.   Tests Ordered: Orders Placed This Encounter  Procedures   LONG TERM MONITOR (3-14 DAYS)   ECHOCARDIOGRAM COMPLETE    Medication Changes: No orders of the defined types were placed in this encounter.   Disposition:  Follow up  test results.  Signed, Satira Sark, MD, [facility] 02/21/2022 11:05 AM    [facility]. [address] [place], [place] [address] Phone: [phone]; Fax: [phone]

## 2022-02-21 ENCOUNTER — Ambulatory Visit (INDEPENDENT_AMBULATORY_CARE_PROVIDER_SITE_OTHER): Payer: Medicaid Other

## 2022-02-21 ENCOUNTER — Encounter: Payer: Self-pay | Admitting: Cardiology

## 2022-02-21 ENCOUNTER — Ambulatory Visit (INDEPENDENT_AMBULATORY_CARE_PROVIDER_SITE_OTHER): Payer: Medicaid Other | Admitting: Cardiology

## 2022-02-21 VITALS — BP 118/80 | HR 82 | Ht 61.0 in | Wt 100.6 lb

## 2022-02-21 DIAGNOSIS — R42 Dizziness and giddiness: Secondary | ICD-10-CM | POA: Diagnosis not present

## 2022-02-21 DIAGNOSIS — R001 Bradycardia, unspecified: Secondary | ICD-10-CM

## 2022-02-21 DIAGNOSIS — R011 Cardiac murmur, unspecified: Secondary | ICD-10-CM

## 2022-02-21 NOTE — Patient Instructions (Signed)
Medication Instructions:  Your physician recommends that you continue on your current medications as directed. Please refer to the Current Medication list given to you today.   Labwork: None today  Testing/Procedures: Your physician has requested that you have an echocardiogram. Echocardiography is a painless test that uses sound waves to create images of your heart. It provides your doctor with information about the size and shape of your heart and how well your heart's chambers and valves are working. This procedure takes approximately one hour. There are no restrictions for this procedure.    ZIO XT- Long Term Monitor Instructions   Your physician has requested you wear your ZIO patch monitor___3____days.   This is a single patch monitor.  [employer] supplies one patch monitor per enrollment.  Additional stickers are not available.   Do not shower for the first 24 hours.  You may shower after the first 24 hours.   Press button if you feel a symptom. You will hear a small click.  Record Date, Time and Symptom in the Patient Log Book.   When you are ready to remove patch, follow instructions on last 2 pages of Patient Log Book.  Stick patch monitor onto last page of Patient Log Book.   Place Patient Log Book in [place] box.  Use locking tab on box and tape box closed securely.  The [employer] has [employer] on it.  Please place in mailbox as soon as possible.  Your physician should have your test results approximately 7 days after the monitor has been mailed back to [facility].   Call [place] at [phone] if you have questions regarding your ZIO XT patch monitor.  Call them immediately if you see an orange light blinking on your monitor.   If your monitor falls off in less than 4 days contact our Monitor department at [phone].  If your monitor becomes loose or falls off after 4 days call [employer] at [phone] for suggestions on securing your  monitor.    Follow-Up: We will call you with results after tests  Any Other Special Instructions Will Be Listed Below (If Applicable).  If you need a refill on your cardiac medications before your next appointment, please call your pharmacy.

## 2022-02-26 ENCOUNTER — Other Ambulatory Visit: Payer: Medicaid Other

## 2022-03-06 ENCOUNTER — Ambulatory Visit (INDEPENDENT_AMBULATORY_CARE_PROVIDER_SITE_OTHER): Payer: Medicaid Other

## 2022-03-06 DIAGNOSIS — R011 Cardiac murmur, unspecified: Secondary | ICD-10-CM | POA: Diagnosis not present

## 2022-03-06 LAB — ECHOCARDIOGRAM COMPLETE
AR max vel: 2.19 cm2
AV Peak grad: 3.7 mmHg
Ao pk vel: 0.97 m/s
Area-P 1/2: 2.49 cm2
Calc EF: 58.7 %
S' Lateral: 2.97 cm
Single Plane A2C EF: 67.8 %
Single Plane A4C EF: 56.1 %

## 2022-04-13 ENCOUNTER — Other Ambulatory Visit: Payer: Self-pay

## 2022-04-13 ENCOUNTER — Emergency Department (HOSPITAL_COMMUNITY)
Admission: EM | Admit: 2022-04-13 | Discharge: 2022-04-14 | Disposition: A | Discharge status: Home / Self Care | Payer: Medicare Other | Attending: Emergency Medicine | Admitting: Emergency Medicine

## 2022-04-13 ENCOUNTER — Encounter (HOSPITAL_COMMUNITY): Payer: Self-pay | Admitting: *Deleted

## 2022-04-13 ENCOUNTER — Emergency Department (HOSPITAL_COMMUNITY): Payer: Medicare Other

## 2022-04-13 DIAGNOSIS — J069 Acute upper respiratory infection, unspecified: Secondary | ICD-10-CM | POA: Diagnosis not present

## 2022-04-13 DIAGNOSIS — R197 Diarrhea, unspecified: Secondary | ICD-10-CM | POA: Insufficient documentation

## 2022-04-13 DIAGNOSIS — Z20822 Contact with and (suspected) exposure to covid-19: Secondary | ICD-10-CM | POA: Insufficient documentation

## 2022-04-13 DIAGNOSIS — R059 Cough, unspecified: Secondary | ICD-10-CM | POA: Diagnosis present

## 2022-04-13 LAB — COMPREHENSIVE METABOLIC PANEL
ALT: 20 U/L (ref 0–44)
AST: 52 U/L — ABNORMAL HIGH (ref 15–41)
Albumin: 3.7 g/dL (ref 3.5–5.0)
Alkaline Phosphatase: 91 U/L (ref 38–126)
Anion gap: 8 (ref 5–15)
BUN: 7 mg/dL (ref 6–20)
CO2: 23 mmol/L (ref 22–32)
Calcium: 9.2 mg/dL (ref 8.9–10.3)
Chloride: 106 mmol/L (ref 98–111)
Creatinine, Ser: 0.53 mg/dL (ref 0.44–1.00)
GFR, Estimated: >60 mL/min (ref >60)
Glucose, Bld: 85 mg/dL (ref 70–99)
Potassium: 3.4 mmol/L — ABNORMAL LOW (ref 3.5–5.1)
Sodium: 137 mmol/L (ref 135–145)
Total Bilirubin: 0.5 mg/dL (ref 0.3–1.2)
Total Protein: 7.2 g/dL (ref 6.5–8.1)

## 2022-04-13 LAB — TROPONIN I (HIGH SENSITIVITY): Troponin I (High Sensitivity): <2 ng/L (ref <18)

## 2022-04-13 LAB — CBC WITH DIFFERENTIAL/PLATELET
Abs Immature Granulocytes: 0.03 10*3/uL (ref 0.00–0.07)
Basophils Absolute: 0 10*3/uL (ref 0.0–0.1)
Basophils Relative: 1 %
Eosinophils Absolute: 0.1 10*3/uL (ref 0.0–0.5)
Eosinophils Relative: 1 %
HCT: 40.8 % (ref 36.0–46.0)
Hemoglobin: 13.6 g/dL (ref 12.0–15.0)
Immature Granulocytes: 1 %
Lymphocytes Relative: 29 %
Lymphs Abs: 1.6 10*3/uL (ref 0.7–4.0)
MCH: 33.2 pg (ref 26.0–34.0)
MCHC: 33.3 g/dL (ref 30.0–36.0)
MCV: 99.5 fL (ref 80.0–100.0)
Monocytes Absolute: 0.8 10*3/uL (ref 0.1–1.0)
Monocytes Relative: 14 %
Neutro Abs: 3 10*3/uL (ref 1.7–7.7)
Neutrophils Relative %: 54 %
Platelets: 280 10*3/uL (ref 150–400)
RBC: 4.1 MIL/uL (ref 3.87–5.11)
RDW: 15.1 % (ref 11.5–15.5)
WBC: 5.4 10*3/uL (ref 4.0–10.5)
nRBC: 0 % (ref 0.0–0.2)

## 2022-04-13 LAB — RESP PANEL BY RT-PCR (FLU A&B, COVID) ARPGX2
Influenza A by PCR: NEGATIVE
Influenza B by PCR: NEGATIVE
SARS Coronavirus 2 by RT PCR: NEGATIVE

## 2022-04-13 LAB — LIPASE, BLOOD: Lipase: 45 U/L (ref 11–51)

## 2022-04-13 NOTE — Discharge Instructions (Signed)
Likely a viral infection, recommend over-the-counter pain medications like ibuprofen Tylenol for fever and pain control, nasal decongestions like Flonase and Zyrtec, Mucinex for cough.  If not eating recommend supplementing with Gatorade to help with electrolyte supplementation.  Follow-up PCP for further evaluation.  Come back to the emergency department if you develop chest pain, shortness of breath, severe abdominal pain, uncontrolled nausea, vomiting, diarrhea.  

## 2022-04-13 NOTE — ED Provider Notes (Signed)
[facility] EMERGENCY DEPARTMENT Provider Note   CSN: [id number] Arrival date & time: 04/13/22  1758     History  Chief Complaint  Patient presents with   Cough    Marissa Frank is a 51 y.o. female.  HPI   Patient without significant medical history presents with complaints of URI-like symptoms.  Patient states symptoms have been on for last week and a half, she endorses he has congestion, slight cough, general body aches, chills, as well as diarrhea.  Patient states that she has been having approximately 5 episodes of diarrhea daily, denies melena or heme chest pain, she has no associated stomach pain, no nausea no vomiting, still tolerating p.o., no chest pain or shortness of breath, denies any recent sick contacts, she is up-to-date on her COVID and her influenza vaccine, no significant abdominal history, no history of Crohn's disease or UC, no recent antibiotic use, no recent hospitalizations, no history of C. difficile.    Home Medications Prior to Admission medications   Medication Sig Start Date End Date Taking? Authorizing Provider  acetaminophen (TYLENOL) 500 MG tablet Take 500 mg by mouth every 6 (six) hours as needed.    [provider]  albuterol (PROVENTIL) (2.5 MG/3ML) 0.083% nebulizer solution Take 3 mLs (2.5 mg total) by nebulization every 6 (six) hours as needed for wheezing or shortness of breath. 04/19/21   Soyla Dryer, PA-C  ferrous sulfate 325 (65 FE) MG tablet Take 325 mg by mouth daily as needed (feeling weak).    [provider]  gabapentin (NEURONTIN) 100 MG capsule Take 1 capsule (100 mg total) by mouth 3 (three) times daily. 11/14/21 02/21/22  Virl Cagey, MD  linaclotide [facility]) 290 MCG CAPS capsule Take 1 capsule (290 mcg total) by mouth daily before breakfast. 02/01/22   Annitta Needs, NP  OLANZapine (ZYPREXA) 2.5 MG tablet Take 2.5 mg by mouth at bedtime.    [provider]  omeprazole (PRILOSEC) 40 MG capsule  Take 1 capsule (40 mg total) by mouth daily. 30 minutes before breakfast 11/21/21   Annitta Needs, NP      Allergies    Almond oil, Carrot [daucus carota], Celery oil, Coconut flavor, and Macadamia nut oil    Review of Systems   Review of Systems  Constitutional:  Positive for chills. Negative for fever.  HENT:  Positive for congestion.   Respiratory:  Positive for cough. Negative for shortness of breath.   Cardiovascular:  Negative for chest pain.  Gastrointestinal:  Positive for diarrhea. Negative for abdominal pain.  Neurological:  Negative for headaches.    Physical Exam Updated Vital Signs BP 117/72   Pulse 66   Temp 98.2 F (36.8 C) (Oral)   Resp 18   Ht '5\' 1"'$  (1.549 m)   Wt 45.4 kg   LMP 06/29/2018   SpO2 100%   BMI 18.89 kg/m  Physical Exam Vitals and nursing note reviewed.  Constitutional:      General: She is not in acute distress.    Appearance: She is not ill-appearing.  HENT:     Head: Normocephalic and atraumatic.     Nose: No congestion.     Mouth/Throat:     Mouth: Mucous membranes are moist.     Pharynx: Oropharynx is clear. No oropharyngeal exudate or posterior oropharyngeal erythema.     Comments: No trismus no torticollis, tongue uvula midline controlling oral secretions tonsils believe symmetric bilaterally no submandibular swelling Eyes:     Conjunctiva/sclera:  Conjunctivae normal.  Cardiovascular:     Rate and Rhythm: Normal rate and regular rhythm.     Pulses: Normal pulses.     Heart sounds: No murmur heard.    No friction rub. No gallop.  Pulmonary:     Effort: No respiratory distress.     Breath sounds: No wheezing, rhonchi or rales.     Comments: No evidence of respiratory distress nontachypneic nonhypoxic, speaking full sentences, no sensory muscle usage, lung sounds are clear bilaterally Abdominal:     Palpations: Abdomen is soft.     Tenderness: There is no abdominal tenderness. There is no right CVA tenderness or left CVA  tenderness.  Musculoskeletal:     Right lower leg: No edema.     Left lower leg: No edema.  Skin:    General: Skin is warm and dry.  Neurological:     Mental Status: She is alert.  Psychiatric:        Mood and Affect: Mood normal.     ED Results / Procedures / Treatments   Labs (all labs ordered are listed, but only abnormal results are displayed) Labs Reviewed  COMPREHENSIVE METABOLIC PANEL - Abnormal; Notable for the following components:      Result Value   Potassium 3.4 (*)    AST 52 (*)    All other components within normal limits  RESP PANEL BY RT-PCR (FLU A&B, COVID) ARPGX2  C DIFFICILE QUICK SCREEN W PCR REFLEX    GASTROINTESTINAL PANEL BY PCR, STOOL (REPLACES STOOL CULTURE)  CBC WITH DIFFERENTIAL/PLATELET  LIPASE, BLOOD  TROPONIN I (HIGH SENSITIVITY)    EKG None  Radiology DG Chest 2 View  Result Date: 04/13/2022 CLINICAL DATA:  Chest pain EXAM: CHEST - 2 VIEW COMPARISON:  04/03/2021 FINDINGS: No acute airspace disease or pleural effusion. Normal cardiomediastinal silhouette. No pneumothorax. Nodule versus rib lesion at the left CP angle. IMPRESSION: 1. No active disease. 2. Focal opacity at the left CP angle could relate to lung nodule or potential bone lesion, this could be assessed with chest CT. Electronically Signed   By: Donavan Foil M.D.   On: 04/13/2022 21:59    Procedures Procedures    Medications Ordered in ED Medications - No data to display  ED Course/ Medical Decision Making/ A&P                           Medical Decision Making Amount and/or Complexity of Data Reviewed Labs: ordered. Radiology: ordered.   This patient presents to the ED for concern of URI, this involves an extensive number of treatment options, and is a complaint that carries with it a high risk of complications and morbidity.  The differential diagnosis includes pneumonia, ACS, PE, C. difficile    Additional history obtained:  Additional history obtained from  N/A External records from outside source obtained and reviewed including N/A   Co morbidities that complicate the patient evaluation  N/A  Social Determinants of Health:  N/A    Lab Tests:  I Ordered, and personally interpreted labs.  The pertinent results include: CBC unremarkable, CMP shows potassium 3.4, AST 52, for troponin less than 2 lipase 45, respiratory panel negative   Imaging Studies ordered:  I ordered imaging studies including chest x-ray I independently visualized and interpreted imaging which showed unremarkable I agree with the radiologist interpretation   Cardiac Monitoring:  The patient was maintained on a cardiac monitor.  I personally viewed and interpreted  the cardiac monitored which showed an underlying rhythm of: Without signs of ischemia   Medicines ordered and prescription drug management:  I ordered medication including N/A I have reviewed the patients home medicines and have made adjustments as needed  Critical Interventions:  N/A   Reevaluation: Presented URI-like symptoms, and diarrhea, suspect this is a viral infection, but due to her symptoms of having diarrhea for 10 days we will follow-up with basic lab work to ensure there is no electrode derailments, will also add on C. difficile and gastrointestinal panel.  Patient has been here for approximately 5 hours without any episodes of diarrhea, she is resting comfortably, having no complaints, she is agreement plan discharge at this time.  Consultations Obtained:  N/A    Test Considered:  N/A    Rule out I have low suspicion for ACS as history is atypical, patient has no cardiac history, EKG was sinus rhythm without signs of ischemia, patient first troponin is negative, second troponin was deferred as she has been having chest tightness for given 12 hours, if ACS was present would expect elevation in troponins at this time..  Low suspicion for PE as patient denies pleuritic chest  pain, shortness of breath, vital signs reassuring nontachypneic nonhypoxic afebrile.  Low suspicion for AAA or aortic dissection as history is atypical, patient has low risk factors.  Low suspicion for systemic infection as patient is nontoxic-appearing, vital signs reassuring, no obvious source infection noted on exam.  Low suspicion for pneumonia as lung sounds are clear bilaterally, x-ray did not reveal any acute findings. low suspicion for strep throat as oropharynx was visualized, no erythema or exudates noted.  My suspicion for C. difficile is very low at this time, she has had no diarrhea during 5 hours that she has been here, she has no risk factors for this, her lab work is all unremarkable.    Dispostion and problem list  After consideration of the diagnostic results and the patients response to treatment, I feel that the patent would benefit from discharge.  URI-likely this is all viral nature, will recommend symptom management, follow-up PCP as needed.            Final Clinical Impression(s) / ED Diagnoses Final diagnoses:  Viral URI with cough    Rx / DC Orders ED Discharge Orders     None         Aron Baba 04/13/22 2353    Dorie Rank, MD 04/14/22 [phone]

## 2022-04-13 NOTE — ED Triage Notes (Addendum)
Here for productive cough, sob, body aches, wheezing, chills, congestion and runny nose. Onset last week. No known fever. Also increased BMs/ diarrhea 10x daily since last week.

## 2022-08-23 ENCOUNTER — Encounter: Payer: Self-pay | Admitting: Emergency Medicine

## 2022-08-23 ENCOUNTER — Ambulatory Visit
Admission: EM | Admit: 2022-08-23 | Discharge: 2022-08-23 | Disposition: A | Discharge status: Home / Self Care | Payer: 59 | Attending: Nurse Practitioner | Admitting: Nurse Practitioner

## 2022-08-23 DIAGNOSIS — B9689 Other specified bacterial agents as the cause of diseases classified elsewhere: Secondary | ICD-10-CM

## 2022-08-23 DIAGNOSIS — J019 Acute sinusitis, unspecified: Secondary | ICD-10-CM | POA: Diagnosis not present

## 2022-08-23 DIAGNOSIS — J4520 Mild intermittent asthma, uncomplicated: Secondary | ICD-10-CM | POA: Diagnosis not present

## 2022-08-23 MED ORDER — ALBUTEROL SULFATE (2.5 MG/3ML) 0.083% IN NEBU: 2.5000 mg | INHALATION_SOLUTION | Freq: Four times a day (QID) | RESPIRATORY_TRACT | 6 refills | Status: AC | PRN

## 2022-08-23 MED ORDER — ALBUTEROL SULFATE HFA 108 (90 BASE) MCG/ACT IN AERS: 1.0000 | INHALATION_SPRAY | Freq: Four times a day (QID) | RESPIRATORY_TRACT | 0 refills | Status: DC | PRN

## 2022-08-23 MED ORDER — DOXYCYCLINE HYCLATE 100 MG PO CAPS: 100.0000 mg | ORAL_CAPSULE | Freq: Two times a day (BID) | ORAL | 0 refills | Status: AC

## 2022-08-23 MED ORDER — FLUTICASONE PROPIONATE 50 MCG/ACT NA SUSP: 1.0000 | Freq: Every day | NASAL | 0 refills | Status: DC

## 2022-08-23 MED ORDER — BENZONATATE 100 MG PO CAPS: 100.0000 mg | ORAL_CAPSULE | Freq: Three times a day (TID) | ORAL | 0 refills | Status: DC | PRN

## 2022-08-23 NOTE — Discharge Instructions (Signed)
It sounds like you have a sinus infection.  Take the doxycycline to treat it.  Please also start the Flonase to help with the itchy/watery eyes.   You can use the albuterol inhaler OR albuterol nebulizer every 4-6 hours as needed for wheezing or shortness of breath.  Do NOT use more frequently.

## 2022-08-23 NOTE — ED Triage Notes (Signed)
Cough, states chest is sore from coughing.  States her vision became blurred this morning.  States she needs medication for her nebulizer machine (Albuterol) and states she needs a refill on her albuterol inhaler.

## 2022-08-23 NOTE — ED Provider Notes (Signed)
[facility]    CSN: [id number] Arrival date & time: 08/23/22  1525      History   Chief Complaint No chief complaint on file.   HPI Marissa Frank is a 52 y.o. female.   Patient presents today for body aches and chills.  Reports she has had a cough for the past 3 weeks that has worsened this week.  Also endorses head and ear pain, sore throat, pain in her cheeks/face and around her eyes.  Reports she has been coughing up green mucus and has been using her albuterol machine a lot.  Reports she needs a refill of the medicine.  Also reports she has been sneezing, her eyes have been watery, and her vision has been blurry intermittently when her eyes are watering.  No abdominal pain, nausea/vomiting, diarrhea, loss of taste or smell, or new rash.  No chest pain, wheezing, or chest tightness.    Medical history significant for asthma.  Reports she is a current smoker.    Past Medical History:  Diagnosis Date   Asthma    Cervical cancer ([place])    Depression    Hemorrhoids     Patient Active Problem List   Diagnosis Date Noted   Prolapsed internal hemorrhoids, grade 3 12/21/2021   GERD (gastroesophageal reflux disease) 11/21/2021   Severe episode of recurrent major depressive disorder, with psychotic features ([place])    Neuropathic pain 09/12/2021   Bloody discharge from left nipple 06/15/2021   Dysphagia 11/16/2020   Peptic ulcer disease 11/16/2020   Constipation 04/18/2018   Rectal bleeding 04/18/2018   Normocytic anemia 04/18/2018   Abdominal pain, chronic, epigastric 04/18/2018    Past Surgical History:  Procedure Laterality Date   BIOPSY  04/24/2018   Procedure: BIOPSY;  Surgeon: Daneil Dolin, MD;  [facility];  Service: Endoscopy;;   BIOPSY  01/20/2021   Procedure: BIOPSY;  Surgeon: Daneil Dolin, MD;  [facility];  Service: Endoscopy;;  gastric   BREAST BIOPSY Left 08/25/2021   Procedure: BREAST BIOPSY WITH DUCT  EXCISIONS;  Surgeon: Virl Cagey, MD;  Location: [facility];  Service: General;  Laterality: Left;   BREAST SURGERY     benign tumor   CERVICAL CONE BIOPSY     COLONOSCOPY WITH PROPOFOL N/A 01/20/2021   hemorrhoids, one 5 mm polyp in sigmoid colon, simgoid and descending colon diverticulosis. leiomyoma   ESOPHAGOGASTRODUODENOSCOPY (EGD) WITH PROPOFOL N/A 04/24/2018   reflux esophagitis s/p dilation, small hiatal hernia, multiple small gastric ulcers s/p biopsy, reactive gastropathy, no erosions. +NSAIDs at that time.   ESOPHAGOGASTRODUODENOSCOPY (EGD) WITH PROPOFOL N/A 01/20/2021   normal esophagus s/p dilation, small hiatal hernia, antral erosions s/p biopsy. Normal duodenum.   MALONEY DILATION N/A 04/24/2018   Procedure: Venia Minks DILATION;  Surgeon: Daneil Dolin, MD;  [facility];  Service: Endoscopy;  Laterality: N/A;   MALONEY DILATION N/A 01/20/2021   Procedure: Venia Minks DILATION;  Surgeon: Daneil Dolin, MD;  [facility];  Service: Endoscopy;  Laterality: N/A;   POLYPECTOMY  01/20/2021   Procedure: POLYPECTOMY;  Surgeon: Daneil Dolin, MD;  [facility];  Service: Endoscopy;;   TUBAL LIGATION     WISDOM TOOTH EXTRACTION      OB History     Gravida  3   Para  2   Term  2   Preterm      AB  1   Living  SAB      IAB  1   Ectopic      Multiple      Live Births               Home Medications    Prior to Admission medications   Medication Sig Start Date End Date Taking? Authorizing Provider  albuterol (VENTOLIN HFA) 108 (90 Base) MCG/ACT inhaler Inhale 1-2 puffs into the lungs every 6 (six) hours as needed for wheezing or shortness of breath. 08/23/22  Yes [name] A, NP  benzonatate (TESSALON) 100 MG capsule Take 1 capsule (100 mg total) by mouth 3 (three) times daily as needed for cough. Do not take with alcohol or while driving or operating heavy machinery.  May cause drowsiness. 08/23/22  Yes  Eulogio Bear, NP  doxycycline (VIBRAMYCIN) 100 MG capsule Take 1 capsule (100 mg total) by mouth 2 (two) times daily for 7 days. 08/23/22 08/30/22 Yes Eulogio Bear, NP  fluticasone (FLONASE) 50 MCG/ACT nasal spray Place 1 spray into both nostrils daily. 08/23/22  Yes Eulogio Bear, NP  acetaminophen (TYLENOL) 500 MG tablet Take 500 mg by mouth every 6 (six) hours as needed.    [provider]  albuterol (PROVENTIL) (2.5 MG/3ML) 0.083% nebulizer solution Take 3 mLs (2.5 mg total) by nebulization every 6 (six) hours as needed for wheezing or shortness of breath. 08/23/22   Eulogio Bear, NP  ferrous sulfate 325 (65 FE) MG tablet Take 325 mg by mouth daily as needed (feeling weak).    [provider]  gabapentin (NEURONTIN) 100 MG capsule Take 1 capsule (100 mg total) by mouth 3 (three) times daily. 11/14/21 02/21/22  Virl Cagey, MD  linaclotide [facility]) 290 MCG CAPS capsule Take 1 capsule (290 mcg total) by mouth daily before breakfast. 02/01/22   Annitta Needs, NP  OLANZapine (ZYPREXA) 2.5 MG tablet Take 2.5 mg by mouth at bedtime.    [provider]  omeprazole (PRILOSEC) 40 MG capsule Take 1 capsule (40 mg total) by mouth daily. 30 minutes before breakfast 11/21/21   Annitta Needs, NP    Family History Family History  Problem Relation Age of Onset   Hypertension Mother    Stroke Mother    Colon cancer Neg Hx    Colon polyps Neg Hx    Stomach cancer Neg Hx    Liver disease Neg Hx     Social History Social History   Tobacco Use   Smoking status: Every Day    Packs/day: 0.25    Types: Cigarettes   Smokeless tobacco: Never  Vaping Use   Vaping Use: Former  Substance Use Topics   Alcohol use: Yes    Alcohol/week: 7.0 standard drinks of alcohol    Types: 7 Glasses of wine per week    Comment: 05/23/21-glass of wine nightly   Drug use: Not Currently    Types: Marijuana    Comment: none since teenager     Allergies   Almond  oil, Carrot [daucus carota], Celery oil, Coconut flavor, and Macadamia nut oil   Review of Systems Review of Systems Per HPI  Physical Exam Triage Vital Signs ED Triage Vitals  Enc Vitals Group     BP 08/23/22 1534 117/80     Pulse Rate 08/23/22 1534 94     Resp 08/23/22 1543 (!) 22     Temp 08/23/22 1534 98 F (36.7 C)     Temp Source 08/23/22 1534 Oral  SpO2 08/23/22 1534 99 %     Weight --      Height --      Head Circumference --      Peak Flow --      Pain Score 08/23/22 1536 5     Pain Loc --      Pain Edu? --      Excl. in [place]? --    No data found.  Updated Vital Signs BP 117/80 (BP Location: Right Arm)   Pulse 94   Temp 98 F (36.7 C) (Oral)   Resp (!) 22   LMP 06/29/2018   SpO2 99%   Visual Acuity Right Eye Distance: 50 Left Eye Distance: 40 Bilateral Distance: 30  Right Eye Near: R Near: 20 Left Eye Near:  L Near: 20 Bilateral Near:  20  Physical Exam Vitals and nursing note reviewed.  Constitutional:      General: She is not in acute distress.    Appearance: Normal appearance. She is not ill-appearing or toxic-appearing.  HENT:     Head: Normocephalic and atraumatic.     Right Ear: Ear canal and external ear normal. A middle ear effusion is present.     Left Ear: Ear canal and external ear normal. A middle ear effusion is present.     Nose: Congestion and rhinorrhea present.     Right Sinus: Maxillary sinus tenderness present. No frontal sinus tenderness.     Left Sinus: Maxillary sinus tenderness present. No frontal sinus tenderness.     Mouth/Throat:     Mouth: Mucous membranes are moist.     Pharynx: Oropharynx is clear. Posterior oropharyngeal erythema present. No oropharyngeal exudate.     Tonsils: No tonsillar exudate. 0 on the right. 0 on the left.  Eyes:     General: No scleral icterus.    Extraocular Movements: Extraocular movements intact.     Comments: Clear, watery discharge from bilateral eyes; no periorbital edema or  periorbital pain.  No pain with extraocular movements.  Cardiovascular:     Rate and Rhythm: Normal rate and regular rhythm.  Pulmonary:     Effort: Pulmonary effort is normal. No respiratory distress.     Breath sounds: Normal breath sounds. No wheezing, rhonchi or rales.     Comments: Patient is speaking in complete sentences without accessory muscle use Abdominal:     General: Abdomen is flat. Bowel sounds are normal. There is no distension.     Palpations: Abdomen is soft.     Tenderness: There is no abdominal tenderness. There is no guarding.  Musculoskeletal:     Cervical back: Normal range of motion and neck supple.  Lymphadenopathy:     Cervical: No cervical adenopathy.  Skin:    General: Skin is warm and dry.     Coloration: Skin is not jaundiced or pale.     Findings: No erythema or rash.  Neurological:     Mental Status: She is alert and oriented to person, place, and time.  Psychiatric:        Behavior: Behavior is cooperative.      UC Treatments / Results  Labs (all labs ordered are listed, but only abnormal results are displayed) Labs Reviewed - No data to display  EKG   Radiology No results found.  Procedures Procedures (including critical care time)  Medications Ordered in UC Medications - No data to display  Initial Impression / Assessment and Plan / UC Course  I have reviewed the triage  vital signs and the nursing notes.  Pertinent labs & imaging results that were available during my care of the patient were reviewed by me and considered in my medical decision making (see chart for details).   Patient is well-appearing, normotensive, afebrile, not tachycardic,  oxygenating well on room air.  She is mildly tachypneic in triage today.  1. Mild intermittent asthma without complication Refills given for both albuterol nebulizer and inhaler No wheezing on examination today Instructed to use 1 or the other every 4-6 hours as needed for wheezing or  shortness of breath  2. Acute bacterial sinusitis Treat with doxycycline twice daily for 7 days Start Flonase to help with itchy/watery eyes/prevent sinusitis in future Start [employer] as needed for dry cough ER and return precautions discussed with patient  The patient was given the opportunity to ask questions.  All questions answered to their satisfaction.  The patient is in agreement to this plan.    Final Clinical Impressions(s) / UC Diagnoses   Final diagnoses:  Mild intermittent asthma without complication  Acute bacterial sinusitis     Discharge Instructions      It sounds like you have a sinus infection.  Take the doxycycline to treat it.  Please also start the Flonase to help with the itchy/watery eyes.   You can use the albuterol inhaler OR albuterol nebulizer every 4-6 hours as needed for wheezing or shortness of breath.  Do NOT use more frequently.       ED Prescriptions     Medication Sig Dispense Auth. Provider   albuterol (VENTOLIN HFA) 108 (90 Base) MCG/ACT inhaler Inhale 1-2 puffs into the lungs every 6 (six) hours as needed for wheezing or shortness of breath. 18 g [name] A, NP   doxycycline (VIBRAMYCIN) 100 MG capsule Take 1 capsule (100 mg total) by mouth 2 (two) times daily for 7 days. 14 capsule [name] A, NP   benzonatate (TESSALON) 100 MG capsule Take 1 capsule (100 mg total) by mouth 3 (three) times daily as needed for cough. Do not take with alcohol or while driving or operating heavy machinery.  May cause drowsiness. 21 capsule [name] A, NP   albuterol (PROVENTIL) (2.5 MG/3ML) 0.083% nebulizer solution Take 3 mLs (2.5 mg total) by nebulization every 6 (six) hours as needed for wheezing or shortness of breath. 75 mL [name] A, NP   fluticasone (FLONASE) 50 MCG/ACT nasal spray Place 1 spray into both nostrils daily. 16 g Eulogio Bear, NP      PDMP not reviewed this encounter.   Eulogio Bear, NP 08/23/22 [phone]

## 2023-01-14 ENCOUNTER — Ambulatory Visit: Payer: 59 | Admitting: Obstetrics and Gynecology

## 2023-05-10 ENCOUNTER — Ambulatory Visit (INDEPENDENT_AMBULATORY_CARE_PROVIDER_SITE_OTHER): Payer: 59 | Admitting: Family Medicine

## 2023-05-10 ENCOUNTER — Encounter: Payer: Self-pay | Admitting: Family Medicine

## 2023-05-10 VITALS — BP 112/74 | HR 66 | Ht 61.0 in | Wt 84.0 lb

## 2023-05-10 DIAGNOSIS — R52 Pain, unspecified: Secondary | ICD-10-CM

## 2023-05-10 DIAGNOSIS — R300 Dysuria: Secondary | ICD-10-CM | POA: Insufficient documentation

## 2023-05-10 DIAGNOSIS — E038 Other specified hypothyroidism: Secondary | ICD-10-CM

## 2023-05-10 DIAGNOSIS — Z136 Encounter for screening for cardiovascular disorders: Secondary | ICD-10-CM

## 2023-05-10 DIAGNOSIS — R7301 Impaired fasting glucose: Secondary | ICD-10-CM

## 2023-05-10 DIAGNOSIS — D509 Iron deficiency anemia, unspecified: Secondary | ICD-10-CM

## 2023-05-10 DIAGNOSIS — E559 Vitamin D deficiency, unspecified: Secondary | ICD-10-CM

## 2023-05-10 DIAGNOSIS — E538 Deficiency of other specified B group vitamins: Secondary | ICD-10-CM | POA: Diagnosis not present

## 2023-05-10 DIAGNOSIS — Z1159 Encounter for screening for other viral diseases: Secondary | ICD-10-CM

## 2023-05-10 MED ORDER — OMEPRAZOLE 40 MG PO CPDR: 40.0000 mg | DELAYED_RELEASE_CAPSULE | Freq: Every day | ORAL | 3 refills | Status: DC

## 2023-05-10 MED ORDER — GABAPENTIN 300 MG PO CAPS: 300.0000 mg | ORAL_CAPSULE | Freq: Three times a day (TID) | ORAL | 3 refills | Status: DC

## 2023-05-10 MED ORDER — EYE DROPS 0.012-0.2 % OP SOLN: OPHTHALMIC | 0 refills | Status: DC

## 2023-05-10 MED ORDER — DULOXETINE HCL 30 MG PO CPEP: 30.0000 mg | ORAL_CAPSULE | Freq: Every day | ORAL | 3 refills | Status: DC

## 2023-05-10 MED ORDER — LINACLOTIDE 290 MCG PO CAPS: 290.0000 ug | ORAL_CAPSULE | Freq: Every day | ORAL | 3 refills | Status: DC

## 2023-05-10 NOTE — Assessment & Plan Note (Signed)
Urinalysis, NuSwab and urine culture ordered- Awaiting results will follow up. May take OTC AZO for urinary pain relief. Explained to increase oral fluid intake. Drink 8 glasses of water daily.  Follow up for worsening or persistent symptoms. Patient verbalizes understanding regarding plan of care and all questions answered.  

## 2023-05-10 NOTE — Patient Instructions (Addendum)
        Great to see you today.  I have refilled the medication(s) we provide.   [facility] at [facility] [address] [place], [place] [address] [phone] Locate on Map      If labs were collected, we will inform you of lab results once received either by echart message or telephone call.   - echart message- for normal results that have been seen by the patient already.   - telephone call: abnormal results or if patient has not viewed results in their echart.   - Please take medications as prescribed. - Follow up with your primary health provider if any health concerns arises. - If symptoms worsen please contact your primary care provider and/or visit the emergency department.

## 2023-05-10 NOTE — Progress Notes (Signed)
New Patient Office Visit   Subjective   Patient ID: Marissa Frank, female    DOB: 09-11-1970  Age: 52 y.o. MRN: [id number]  CC:  Chief Complaint  Patient presents with   Establish Care    Patient is here to establish care. Complains of headache for 3 days. Body aches for 6 months.     HPI Marissa Frank 52 year old female, presents to establish care. She  has a past medical history of Asthma, Cervical cancer ([facility]), Depression, and Hemorrhoids.  Dysuria The patient reports a recurrent issue with dysuria, which has been gradually worsening. She describes the pain as aching, with a severity of 5/10. There has been no associated fever. She is sexually active, with no history of pyelonephritis or kidney stones. Pertinent negatives include the absence of nausea or vomiting. The patient has tried increasing fluid intake, which provided mild relief.      Outpatient Encounter Medications as of 05/10/2023  Medication Sig   acetaminophen (TYLENOL) 500 MG tablet Take 500 mg by mouth every 6 (six) hours as needed.   albuterol (PROVENTIL) (2.5 MG/3ML) 0.083% nebulizer solution Take 3 mLs (2.5 mg total) by nebulization every 6 (six) hours as needed for wheezing or shortness of breath.   albuterol (VENTOLIN HFA) 108 (90 Base) MCG/ACT inhaler Inhale 1-2 puffs into the lungs every 6 (six) hours as needed for wheezing or shortness of breath.   benzonatate (TESSALON) 100 MG capsule Take 1 capsule (100 mg total) by mouth 3 (three) times daily as needed for cough. Do not take with alcohol or while driving or operating heavy machinery.  May cause drowsiness.   DULoxetine (CYMBALTA) 30 MG capsule Take 1 capsule (30 mg total) by mouth daily.   ferrous sulfate 325 (65 FE) MG tablet Take 325 mg by mouth daily as needed (feeling weak).   fluticasone (FLONASE) 50 MCG/ACT nasal spray Place 1 spray into both nostrils daily.   gabapentin (NEURONTIN) 300 MG capsule Take 1 capsule (300 mg total) by  mouth 3 (three) times daily.   Naphazoline-Polyethyl Glycol (EYE DROPS) 0.012-0.2 % SOLN One drop each eye as needed   [DISCONTINUED] omeprazole (PRILOSEC) 40 MG capsule Take 1 capsule (40 mg total) by mouth daily. 30 minutes before breakfast   linaclotide (LINZESS) 290 MCG CAPS capsule Take 1 capsule (290 mcg total) by mouth daily before breakfast.   omeprazole (PRILOSEC) 40 MG capsule Take 1 capsule (40 mg total) by mouth daily. 30 minutes before breakfast   [DISCONTINUED] gabapentin (NEURONTIN) 100 MG capsule Take 1 capsule (100 mg total) by mouth 3 (three) times daily. (Patient not taking: Reported on 05/10/2023)   [DISCONTINUED] linaclotide (LINZESS) 290 MCG CAPS capsule Take 1 capsule (290 mcg total) by mouth daily before breakfast. (Patient not taking: Reported on 05/10/2023)   [DISCONTINUED] OLANZapine (ZYPREXA) 2.5 MG tablet Take 2.5 mg by mouth at bedtime. (Patient not taking: Reported on 05/10/2023)   No facility-administered encounter medications on file as of 05/10/2023.    Past Surgical History:  Procedure Laterality Date   BIOPSY  04/24/2018   Procedure: BIOPSY;  Surgeon: Corbin Ade, MD;  [facility];  Service: Endoscopy;;   BIOPSY  01/20/2021   Procedure: BIOPSY;  Surgeon: Corbin Ade, MD;  [facility];  Service: Endoscopy;;  gastric   BREAST BIOPSY Left 08/25/2021   Procedure: BREAST BIOPSY WITH DUCT EXCISIONS;  Surgeon: Lucretia Roers, MD;  Location: [facility];  Service: General;  Laterality: Left;  BREAST SURGERY     benign tumor   CERVICAL CONE BIOPSY     COLONOSCOPY WITH PROPOFOL N/A 01/20/2021   hemorrhoids, one 5 mm polyp in sigmoid colon, simgoid and descending colon diverticulosis. leiomyoma   ESOPHAGOGASTRODUODENOSCOPY (EGD) WITH PROPOFOL N/A 04/24/2018   reflux esophagitis s/p dilation, small hiatal hernia, multiple small gastric ulcers s/p biopsy, reactive gastropathy, no erosions. +NSAIDs at that time.    ESOPHAGOGASTRODUODENOSCOPY (EGD) WITH PROPOFOL N/A 01/20/2021   normal esophagus s/p dilation, small hiatal hernia, antral erosions s/p biopsy. Normal duodenum.   MALONEY DILATION N/A 04/24/2018   Procedure: Elease Hashimoto DILATION;  Surgeon: Corbin Ade, MD;  [facility];  Service: Endoscopy;  Laterality: N/A;   MALONEY DILATION N/A 01/20/2021   Procedure: Elease Hashimoto DILATION;  Surgeon: Corbin Ade, MD;  [facility];  Service: Endoscopy;  Laterality: N/A;   POLYPECTOMY  01/20/2021   Procedure: POLYPECTOMY;  Surgeon: Corbin Ade, MD;  [facility];  Service: Endoscopy;;   TUBAL LIGATION     WISDOM TOOTH EXTRACTION      Review of Systems  Constitutional:  Negative for chills and fever.  Eyes:  Negative for blurred vision.  Respiratory:  Negative for shortness of breath.   Cardiovascular:  Negative for chest pain.  Gastrointestinal:  Negative for abdominal pain and diarrhea.  Genitourinary:  Positive for dysuria and urgency. Negative for flank pain, frequency and hematuria.  Musculoskeletal:  Positive for joint pain and myalgias.  Neurological:  Negative for headaches.      Objective    BP 112/74   Pulse 66   Ht 5\' 1"  (1.549 m)   Wt 84 lb (38.1 kg)   LMP 06/29/2018   SpO2 92%   BMI 15.87 kg/m   Physical Exam Vitals reviewed.  Constitutional:      General: She is not in acute distress.    Appearance: Normal appearance. She is not ill-appearing, toxic-appearing or diaphoretic.  HENT:     Head: Normocephalic.     Right Ear: Tympanic membrane normal.     Left Ear: Tympanic membrane normal.     Mouth/Throat:     Mouth: Mucous membranes are moist.  Eyes:     General:        Right eye: No discharge.        Left eye: No discharge.     Conjunctiva/sclera: Conjunctivae normal.  Cardiovascular:     Rate and Rhythm: Normal rate.     Pulses: Normal pulses.     Heart sounds: Normal heart sounds.  Pulmonary:     Effort: Pulmonary effort is  normal. No respiratory distress.     Breath sounds: Normal breath sounds.  Abdominal:     General: Bowel sounds are normal.     Palpations: Abdomen is soft.     Tenderness: There is no abdominal tenderness. There is no right CVA tenderness, left CVA tenderness or guarding.  Musculoskeletal:        General: Normal range of motion.     Cervical back: Normal range of motion.  Skin:    General: Skin is warm and dry.     Capillary Refill: Capillary refill takes less than 2 seconds.  Neurological:     General: No focal deficit present.     Mental Status: She is alert and oriented to person, place, and time.     Coordination: Coordination normal.     Gait: Gait normal.  Psychiatric:  Mood and Affect: Mood normal.        Behavior: Behavior normal.       Assessment & Plan:  Encounter for screening for cardiovascular disorders -     CBC with Differential/Platelet -     CMP14+EGFR -     Lipid panel  Diffuse pain Assessment & Plan: Labs ordered, Trial on Cymbalta 30 me once daily Advise patient  includes rest, applying ice for 15-20 minutes several times a day on affected joint and gentle stretching and strengthening exercises to improve muscle support. Possible Physical therapy referral may help with proper technique. Using a knee brace or compression bandage can provide support, and elevating the leg helps reduce swelling. Low-impact exercises like swimming or cycling should be introduced gradually, and maintaining a healthy weight can prevent future pain.   Orders: -     Rheumatoid factor -     ANA w/Reflex  IFG (impaired fasting glucose) -     Hemoglobin A1c -     Microalbumin / creatinine urine ratio  Need for hepatitis C screening test -     Hepatitis C antibody  TSH (thyroid-stimulating hormone deficiency) -     TSH + free T4  Vitamin B12 deficiency -     Vitamin B12  Vitamin D deficiency -     VITAMIN D 25 Hydroxy (Vit-D Deficiency, Fractures)  Iron deficiency  anemia, unspecified iron deficiency anemia type -     Iron, TIBC and Ferritin Panel  Dysuria Assessment & Plan: Urinalysis, NuSwab and urine culture ordered- Awaiting results will follow up. May take OTC AZO for urinary pain relief. Explained to increase oral fluid intake. Drink 8 glasses of water daily.  Follow up for worsening or persistent symptoms. Patient verbalizes understanding regarding plan of care and all questions answered.   Orders: -     Urine Culture -     Urinalysis  Other orders -     DULoxetine HCl; Take 1 capsule (30 mg total) by mouth daily.  Dispense: 30 capsule; Refill: 3 -     Gabapentin; Take 1 capsule (300 mg total) by mouth 3 (three) times daily.  Dispense: 90 capsule; Refill: 3 -     linaCLOtide; Take 1 capsule (290 mcg total) by mouth daily before breakfast.  Dispense: 90 capsule; Refill: 3 -     Omeprazole; Take 1 capsule (40 mg total) by mouth daily. 30 minutes before breakfast  Dispense: 90 capsule; Refill: 3 -     Eye Drops; One drop each eye as needed  Dispense: 15 mL; Refill: 0    Return in about 6 months (around 11/08/2023), or if symptoms worsen or fail to improve, for chronic follow-up.   Cruzita Lederer Newman Nip, FNP

## 2023-05-10 NOTE — Assessment & Plan Note (Signed)
Labs ordered, Trial on Cymbalta 30 me once daily Advise patient  includes rest, applying ice for 15-20 minutes several times a day on affected joint and gentle stretching and strengthening exercises to improve muscle support. Possible Physical therapy referral may help with proper technique. Using a knee brace or compression bandage can provide support, and elevating the leg helps reduce swelling. Low-impact exercises like swimming or cycling should be introduced gradually, and maintaining a healthy weight can prevent future pain.

## 2023-05-12 ENCOUNTER — Other Ambulatory Visit: Payer: Self-pay | Admitting: Family Medicine

## 2023-05-12 MED ORDER — VITAMIN D3 50 MCG (2000 UT) PO CAPS: 2000.0000 [IU] | ORAL_CAPSULE | Freq: Every day | ORAL | 2 refills | Status: DC

## 2023-05-12 NOTE — Progress Notes (Signed)
Please Inform patient,  Referral placed to hematology for abnormal iron panel.   Vitamin D levels low, I advise to taking  over the counter supplements of vitamin D 2000 IU/day to prevent low vitamin D levels. Consuming Vitamin D rich food sources include fish, salmon, sardines, egg yolks, red meat, liver, oranges, soy milk.       Liver enzymes elevated possible due Non-Alcoholic Fatty Liver Disease (NAFLD): Accumulation of fat in the liver not related to alcohol can elevate these enzymes. I encourage: Dietary Changes: Adopting a liver-friendly diet low in saturated fats, sugars, and alcohol can help reduce liver stress. Incorporating plenty of fruits, vegetables, whole grains, and lean proteins is recommended. Avoiding Toxins: Reducing or eliminating exposure to substances that can harm the liver, such as alcohol, certain medications, and environmental toxins, is important. Follow up in 4 months to recheck labs and monitor trends.

## 2023-05-13 LAB — URINE CULTURE

## 2023-05-14 LAB — IRON,TIBC AND FERRITIN PANEL
Ferritin: 268 ng/mL — ABNORMAL HIGH (ref 15–150)
Iron Saturation: >93 % (ref 15–55)
Iron: 224 ug/dL — ABNORMAL HIGH (ref 27–159)
Total Iron Binding Capacity: <241 ug/dL — ABNORMAL LOW (ref 250–450)
UIBC: <17 ug/dL — ABNORMAL LOW (ref 131–425)

## 2023-05-14 LAB — VITAMIN D 25 HYDROXY (VIT D DEFICIENCY, FRACTURES): Vit D, 25-Hydroxy: 9.7 ng/mL — ABNORMAL LOW (ref 30.0–100.0)

## 2023-05-14 LAB — HEMOGLOBIN A1C
Est. average glucose Bld gHb Est-mCnc: 97 mg/dL
Hgb A1c MFr Bld: 5 % (ref 4.8–5.6)

## 2023-05-14 LAB — CBC WITH DIFFERENTIAL/PLATELET
Basos: 1 %
EOS (ABSOLUTE): 0 10*3/uL (ref 0.0–0.2)
Eos: 0 %
Hematocrit: 38.3 % (ref 34.0–46.6)
Hemoglobin: 13.2 g/dL (ref 11.1–15.9)
Immature Granulocytes: 0 %
Immature Granulocytes: 0 10*3/uL (ref 0.0–0.1)
Lymphs: 28 %
MCH: 35.2 pg — ABNORMAL HIGH (ref 26.6–33.0)
MCHC: 34.5 g/dL (ref 31.5–35.7)
MCV: 102 fL — ABNORMAL HIGH (ref 79–97)
Monocytes Absolute: 0 10*3/uL (ref 0.0–0.4)
Monocytes Absolute: 0.7 10*3/uL (ref 0.1–0.9)
Monocytes: 23 %
Neutrophils Absolute: 0.9 10*3/uL (ref 0.7–3.1)
Neutrophils Absolute: 1.4 10*3/uL (ref 1.4–7.0)
Neutrophils: 48 %
Platelets: 256 10*3/uL (ref 150–450)
RBC: 3.75 x10E6/uL — ABNORMAL LOW (ref 3.77–5.28)
RDW: 12.1 % (ref 11.7–15.4)
WBC: 3 10*3/uL — ABNORMAL LOW (ref 3.4–10.8)

## 2023-05-14 LAB — CMP14+EGFR
ALT: 47 [IU]/L — ABNORMAL HIGH (ref 0–32)
AST: 231 [IU]/L — ABNORMAL HIGH (ref 0–40)
Albumin: 4 g/dL (ref 3.8–4.9)
Alkaline Phosphatase: 123 [IU]/L — ABNORMAL HIGH (ref 44–121)
BUN/Creatinine Ratio: 9 (ref 9–23)
BUN: 6 mg/dL (ref 6–24)
Bilirubin Total: 0.8 mg/dL (ref 0.0–1.2)
CO2: 20 mmol/L (ref 20–29)
Calcium: 9.6 mg/dL (ref 8.7–10.2)
Chloride: 99 mmol/L (ref 96–106)
Creatinine, Ser: 0.66 mg/dL (ref 0.57–1.00)
Globulin, Total: 2.2 g/dL (ref 1.5–4.5)
Glucose: 89 mg/dL (ref 70–99)
Potassium: 4.1 mmol/L (ref 3.5–5.2)
Sodium: 138 mmol/L (ref 134–144)
Total Protein: 6.2 g/dL (ref 6.0–8.5)
eGFR: 104 mL/min/{1.73_m2} (ref >59)

## 2023-05-14 LAB — TSH+FREE T4
Free T4: 0.92 ng/dL (ref 0.82–1.77)
TSH: 0.728 u[IU]/mL (ref 0.450–4.500)

## 2023-05-14 LAB — LIPID PANEL
Chol/HDL Ratio: 1.5 {ratio} (ref 0.0–4.4)
Cholesterol, Total: 287 mg/dL — ABNORMAL HIGH (ref 100–199)
HDL: 190 mg/dL (ref >39)
LDL Chol Calc (NIH): 88 mg/dL (ref 0–99)
Triglycerides: 63 mg/dL (ref 0–149)
VLDL Cholesterol Cal: 9 mg/dL (ref 5–40)

## 2023-05-14 LAB — VITAMIN B12: Vitamin B-12: 650 pg/mL (ref 232–1245)

## 2023-05-14 LAB — MICROALBUMIN / CREATININE URINE RATIO
Creatinine, Urine: 194.7 mg/dL
Microalb/Creat Ratio: 5 mg/g{creat} (ref 0–29)
Microalbumin, Urine: 10 ug/mL

## 2023-05-14 LAB — ANA W/REFLEX

## 2023-05-14 LAB — RHEUMATOID FACTOR: Rheumatoid fact SerPl-aCnc: 13.5 [IU]/mL (ref <14.0)

## 2023-05-14 LAB — HEPATITIS C ANTIBODY: Hep C Virus Ab: NONREACTIVE

## 2023-05-15 ENCOUNTER — Other Ambulatory Visit: Payer: Self-pay | Admitting: Family Medicine

## 2023-05-15 LAB — URINALYSIS
Glucose, UA: NEGATIVE
Leukocytes,UA: NEGATIVE
Nitrite, UA: NEGATIVE
Protein,UA: NEGATIVE
RBC, UA: NEGATIVE
Specific Gravity, UA: 1.03 (ref 1.005–1.030)
Urobilinogen, Ur: 0.2 mg/dL (ref 0.2–1.0)
pH, UA: 5 (ref 5.0–7.5)

## 2023-05-15 MED ORDER — SOLIFENACIN SUCCINATE 5 MG PO TABS: 5.0000 mg | ORAL_TABLET | Freq: Every day | ORAL | 2 refills | Status: DC

## 2023-05-16 ENCOUNTER — Telehealth: Payer: Self-pay | Admitting: Family Medicine

## 2023-05-16 NOTE — Telephone Encounter (Signed)
Patient calling says when she called [facility] they said they did not have her medications. Please advise, thank you

## 2023-06-03 ENCOUNTER — Encounter: Payer: Self-pay | Admitting: Oncology

## 2023-06-03 ENCOUNTER — Inpatient Hospital Stay: Payer: 59

## 2023-06-03 ENCOUNTER — Other Ambulatory Visit: Payer: Self-pay | Admitting: Oncology

## 2023-06-03 ENCOUNTER — Inpatient Hospital Stay: Payer: 59 | Attending: Oncology | Admitting: Oncology

## 2023-06-03 ENCOUNTER — Other Ambulatory Visit: Payer: Self-pay

## 2023-06-03 DIAGNOSIS — R748 Abnormal levels of other serum enzymes: Secondary | ICD-10-CM

## 2023-06-03 DIAGNOSIS — R7989 Other specified abnormal findings of blood chemistry: Secondary | ICD-10-CM

## 2023-06-03 DIAGNOSIS — Z720 Tobacco use: Secondary | ICD-10-CM | POA: Diagnosis not present

## 2023-06-03 LAB — CBC WITH DIFFERENTIAL/PLATELET
Abs Immature Granulocytes: 0.01 10*3/uL (ref 0.00–0.07)
Basophils Absolute: 0 10*3/uL (ref 0.0–0.1)
Basophils Relative: 1 %
Eosinophils Absolute: 0 10*3/uL (ref 0.0–0.5)
Eosinophils Relative: 1 %
HCT: 38.1 % (ref 36.0–46.0)
Hemoglobin: 12.6 g/dL (ref 12.0–15.0)
Immature Granulocytes: 0 %
Lymphocytes Relative: 23 %
Lymphs Abs: 0.9 10*3/uL (ref 0.7–4.0)
MCH: 34.5 pg — ABNORMAL HIGH (ref 26.0–34.0)
MCHC: 33.1 g/dL (ref 30.0–36.0)
MCV: 104.4 fL — ABNORMAL HIGH (ref 80.0–100.0)
Monocytes Absolute: 0.7 10*3/uL (ref 0.1–1.0)
Monocytes Relative: 17 %
Neutro Abs: 2.3 10*3/uL (ref 1.7–7.7)
Neutrophils Relative %: 58 %
Platelets: 267 10*3/uL (ref 150–400)
RBC: 3.65 MIL/uL — ABNORMAL LOW (ref 3.87–5.11)
RDW: 14.7 % (ref 11.5–15.5)
WBC: 3.9 10*3/uL — ABNORMAL LOW (ref 4.0–10.5)
nRBC: 0 % (ref 0.0–0.2)

## 2023-06-03 LAB — VITAMIN B12: Vitamin B-12: 301 pg/mL (ref 180–914)

## 2023-06-03 LAB — FOLATE: Folate: 1.9 ng/mL — ABNORMAL LOW (ref >5.9)

## 2023-06-03 LAB — IRON AND TIBC
Iron: 80 ug/dL (ref 28–170)
Saturation Ratios: 33 % — ABNORMAL HIGH (ref 10.4–31.8)
TIBC: 244 ug/dL — ABNORMAL LOW (ref 250–450)
UIBC: 164 ug/dL

## 2023-06-03 LAB — FERRITIN: Ferritin: 95 ng/mL (ref 11–307)

## 2023-06-03 NOTE — Assessment & Plan Note (Signed)
Reports smoking 1 pack every two days. -Encouraged smoking cessation.

## 2023-06-03 NOTE — Patient Instructions (Signed)
VISIT SUMMARY:  During your visit, we discussed several health concerns including severe leg pain, numbness in your feet, high iron levels, arthritis, elevated liver enzymes, being underweight, insomnia, and tobacco use. We have created a plan to address each of these issues.  YOUR PLAN:  -IRON OVERLOAD: Iron overload means having too much iron in your body, which can cause damage to organs like the liver, kidneys, and heart. We will stop your iron supplements, order genetic testing for hemochromatosis, and schedule a liver MRI to check for iron deposits. We will repeat your blood tests in three months to see if your iron levels have improved.  -ARTHRITIS: Arthritis is inflammation of the joints, causing pain and stiffness. We recommend switching from Tylenol to Advil for pain relief due to your elevated liver enzymes.   -ELEVATED LIVER ENZYMES: Elevated liver enzymes can indicate liver damage, which may be related to your daily alcohol consumption and long-term use of Tylenol. We advise reducing your alcohol intake and switching from Tylenol to Advil for pain management.  -UNDERWEIGHT: Being underweight can affect your overall health. Although you do not wish to gain weight or see a nutritionist now, we can consider a referral to a nutritionist in the future if you are open to it.  -INSOMNIA: Insomnia is difficulty sleeping. If your sleep habits do not improve, we may refer you to a sleep specialist for further evaluation.  -TOBACCO USE: Smoking can have many negative effects on your health. We encourage you to consider quitting smoking.  INSTRUCTIONS:  Please follow up in three months for repeat labs. If there are any concerning results from your genetic testing or liver MRI, we will contact you to schedule an earlier appointment.

## 2023-06-03 NOTE — Progress Notes (Signed)
[facility] HEMATOLOGY NEW VISIT  [name], [place], [place]  REASON FOR REFERRAL: Elevated ferritin and Tsat  SUMMARY OF HEMATOLOGIC HISTORY:    Latest Ref Rng & Units 05/10/2023    1:08 PM 04/13/2022   10:25 PM 10/08/2021    2:44 PM  CBC  WBC 3.4 - 10.8 x10E3/uL 3.0  5.4  3.8   Hemoglobin 11.1 - 15.9 g/dL 16.1  09.6  04.5   Hematocrit 34.0 - 46.6 % 38.3  40.8  42.9   Platelets 150 - 450 x10E3/uL 256  280  425     Lab Results  Component Value Date   IRON 224 (H) 05/10/2023   TIBC <241 (L) 05/10/2023   FERRITIN 268 (H) 05/10/2023     Latest Reference Range & Units 05/10/23 13:08  Iron Saturation 15 - 55 % >93 (HH)  (HH): Data is critically high   HISTORY OF PRESENT ILLNESS: Marissa Frank 52 y.o. female referred for iron overload.  She has a past medical history of arthritis, anxiety, depression, COPD and leg pain.  She also reported that she had history of anemia and has been taking iron supplements for a long time.  Today, she reports severe pain in her left leg which she describes as feeling like she has been lifting weights all night. The pain is so severe that it hinders her mobility, making it difficult for her to get up from bed to go to the bathroom. She also reports numbness in both her feet, which has been ongoing for a year. She also reports seeing specks and experiencing blurry vision, and her eyes often feel like they want to roll back in her head.  She also has arthritis, which started in the past year and has been getting worse. All her joints hurt, and she takes over-the-counter Tylenol for the pain. She also has gabapentin, but she avoids taking it because it makes her sleep for a full day.  She has a history of mental health issues and is on disability. She reports having sleep issues, sometimes staying awake for three days straight. When she does sleep, she sleeps deeply and for a long time. She also reports being  easily awakened by noises in her house, which makes her paranoid.   She is a smoker, smoking a pack every two days, and she drinks a bottle of wine a day. She has a petite frame and weighs only 84 pounds. She does not wish to gain weight and does not want to see a nutritionist.  She lives in [place].  I have reviewed the past medical history, past surgical history, social history and family history with the patient   ALLERGIES:  is allergic to almond oil, carrot [daucus carota], celery oil, coconut flavor, and macadamia nut oil.  MEDICATIONS:  Current Outpatient Medications  Medication Sig Dispense Refill   acetaminophen (TYLENOL) 500 MG tablet Take 500 mg by mouth every 6 (six) hours as needed.     albuterol (PROVENTIL) (2.5 MG/3ML) 0.083% nebulizer solution Take 3 mLs (2.5 mg total) by nebulization every 6 (six) hours as needed for wheezing or shortness of breath. 75 mL 6   albuterol (VENTOLIN HFA) 108 (90 Base) MCG/ACT inhaler Inhale 1-2 puffs into the lungs every 6 (six) hours as needed for wheezing or shortness of breath. 18 g 0   benzonatate (TESSALON) 100 MG capsule Take 1 capsule (100 mg total) by mouth 3 (three) times daily as needed for  cough. Do not take with alcohol or while driving or operating heavy machinery.  May cause drowsiness. 21 capsule 0   Cholecalciferol (VITAMIN D3) 50 MCG (2000 UT) capsule Take 1 capsule (2,000 Units total) by mouth daily. 90 capsule 2   DULoxetine (CYMBALTA) 30 MG capsule Take 1 capsule (30 mg total) by mouth daily. 30 capsule 3   fluticasone (FLONASE) 50 MCG/ACT nasal spray Place 1 spray into both nostrils daily. 16 g 0   gabapentin (NEURONTIN) 300 MG capsule Take 1 capsule (300 mg total) by mouth 3 (three) times daily. 90 capsule 3   linaclotide (LINZESS) 290 MCG CAPS capsule Take 1 capsule (290 mcg total) by mouth daily before breakfast. 90 capsule 3   Naphazoline-Polyethyl Glycol (EYE DROPS) 0.012-0.2 % SOLN One drop each eye as needed 15 mL 0    omeprazole (PRILOSEC) 40 MG capsule Take 1 capsule (40 mg total) by mouth daily. 30 minutes before breakfast 90 capsule 3   solifenacin (VESICARE) 5 MG tablet Take 1 tablet (5 mg total) by mouth daily. 30 tablet 2   No current facility-administered medications for this visit.     REVIEW OF SYSTEMS:   Constitutional: Denies fevers, chills or night sweats Ears, nose, mouth, throat, and face: Denies mucositis or sore throat Respiratory: Denies cough, dyspnea or wheezes Cardiovascular: Denies palpitation, chest discomfort or lower extremity swelling Gastrointestinal:  Denies nausea, heartburn or change in bowel habits Skin: Denies abnormal skin rashes Lymphatics: Denies new lymphadenopathy or easy bruising Neurological:Denies numbness, tingling or new weaknesses Behavioral/Psych: Mood is stable, no new changes  All other systems were reviewed with the patient and are negative.  PHYSICAL EXAMINATION:   Vitals:   06/03/23 1018  BP: 104/70  Pulse: 63  Resp: 18  Temp: 98 F (36.7 C)  SpO2: 96%    GENERAL:alert, no distress and comfortable SKIN: skin color, texture, turgor are normal, no rashes or significant lesions LUNGS: clear to auscultation and percussion with normal breathing effort HEART: regular rate & rhythm and no murmurs and no lower extremity edema ABDOMEN:abdomen soft, non-tender and normal bowel sounds Musculoskeletal:no cyanosis of digits and no clubbing  NEURO: alert & oriented x 3 with fluent speech  LABORATORY DATA:  I have reviewed the data as listed  Lab Results  Component Value Date   WBC 3.0 (L) 05/10/2023   NEUTROABS 1.4 05/10/2023   HGB 13.2 05/10/2023   HCT 38.3 05/10/2023   MCV 102 (H) 05/10/2023   PLT 256 05/10/2023      Component Value Date/Time   NA 138 05/10/2023 1308   K 4.1 05/10/2023 1308   CL 99 05/10/2023 1308   CO2 20 05/10/2023 1308   GLUCOSE 89 05/10/2023 1308   GLUCOSE 85 04/13/2022 2250   BUN 6 05/10/2023 1308   CREATININE  0.66 05/10/2023 1308   CALCIUM 9.6 05/10/2023 1308   PROT 6.2 05/10/2023 1308   ALBUMIN 4.0 05/10/2023 1308   AST 231 (H) 05/10/2023 1308   ALT 47 (H) 05/10/2023 1308   ALKPHOS 123 (H) 05/10/2023 1308   BILITOT 0.8 05/10/2023 1308   GFRNONAA >60 04/13/2022 2250   GFRAA >60 09/14/2019 1848       Chemistry      Component Value Date/Time   NA 138 05/10/2023 1308   K 4.1 05/10/2023 1308   CL 99 05/10/2023 1308   CO2 20 05/10/2023 1308   BUN 6 05/10/2023 1308   CREATININE 0.66 05/10/2023 1308      Component Value Date/Time  CALCIUM 9.6 05/10/2023 1308   ALKPHOS 123 (H) 05/10/2023 1308   AST 231 (H) 05/10/2023 1308   ALT 47 (H) 05/10/2023 1308   BILITOT 0.8 05/10/2023 1308     Lab Results  Component Value Date   IRON 224 (H) 05/10/2023   TIBC <241 (L) 05/10/2023   FERRITIN 268 (H) 05/10/2023     ASSESSMENT & PLAN:  Patient is a 52 year old female referred for iron overload .  Iron overload High iron saturation levels and history of long-term iron supplementation.  Ferritin more than 200, TSAT greater than 93.  Discussed potential genetic cause (hemochromatosis) and associated risks including liver failure and heart failure. -Stop iron supplementation. -Ordered genetic testing for hemochromatosis. -Ordered liver MRI to assess for iron deposition. -Repeat labs in three months to reassess iron levels.  Elevated liver enzymes History of daily alcohol consumption (1 bottle of wine/day) and long-term use of Tylenol and probable iron overload -Advised to reduce alcohol consumption. -Switch from Tylenol to Advil for pain management.  Tobacco use Reports smoking 1 pack every two days. -Encouraged smoking cessation.   Orders Placed This Encounter  Procedures   MR Abdomen W Wo Contrast    Liver MRI for iron overload    Standing Status:   Future    Standing Expiration Date:   06/02/2024    Order Specific Question:   If indicated for the ordered procedure, I authorize  the administration of contrast media per Radiology protocol    Answer:   Yes    Order Specific Question:   What is the patient's sedation requirement?    Answer:   No Sedation    Order Specific Question:   Does the patient have a pacemaker or implanted devices?    Answer:   No    Order Specific Question:   Preferred imaging location?    Answer:   [facility] (table limit [phone])   HFE-Associated Hereditary Hemochromatosis (COHESION)    The total time spent in the appointment was 30 minutes encounter with patients including review of chart and various tests results, discussions about plan of care and coordination of care plan   All questions were answered. The patient knows to call the clinic with any problems, questions or concerns. No barriers to learning was detected.   Cindie Crumbly, MD 11/4/202410:58 AM

## 2023-06-03 NOTE — Assessment & Plan Note (Signed)
History of daily alcohol consumption (1 bottle of wine/day) and long-term use of Tylenol and probable iron overload -Advised to reduce alcohol consumption. -Switch from Tylenol to Advil for pain management.

## 2023-06-03 NOTE — Assessment & Plan Note (Addendum)
High iron saturation levels and history of long-term iron supplementation.  Ferritin more than 200, TSAT greater than 93.  Discussed potential genetic cause (hemochromatosis) and associated risks including liver failure and heart failure. -Stop iron supplementation. -Ordered genetic testing for hemochromatosis. -Ordered liver MRI to assess for iron deposition. -Repeat labs in three months to reassess iron levels.

## 2023-06-05 ENCOUNTER — Other Ambulatory Visit: Payer: Self-pay | Admitting: Oncology

## 2023-06-05 MED ORDER — FOLIC ACID 1 MG PO TABS: 1.0000 mg | ORAL_TABLET | Freq: Every day | ORAL | 2 refills | Status: DC

## 2023-06-05 NOTE — Progress Notes (Signed)
Patient called and aware of lab results. Patient aware of prescription for folate sent to pharmacy.

## 2023-06-10 ENCOUNTER — Ambulatory Visit (HOSPITAL_COMMUNITY)
Admission: RE | Admit: 2023-06-10 | Discharge: 2023-06-10 | Disposition: A | Discharge status: Home / Self Care | Payer: 59 | Source: Ambulatory Visit | Attending: Oncology | Admitting: Oncology

## 2023-06-10 MED ORDER — GADOBUTROL 1 MMOL/ML IV SOLN
4.0000 mL | Freq: Once | INTRAVENOUS | Status: AC | PRN
Start: 2023-06-10 — End: 2023-06-10
  Administered 2023-06-10: 4 mL via INTRAVENOUS

## 2023-06-19 ENCOUNTER — Other Ambulatory Visit: Payer: Self-pay | Admitting: Family Medicine

## 2023-06-19 ENCOUNTER — Other Ambulatory Visit: Payer: Self-pay

## 2023-06-19 ENCOUNTER — Telehealth: Payer: Self-pay

## 2023-06-19 MED ORDER — CYCLOSPORINE 0.05 % OP EMUL: 1.0000 [drp] | Freq: Two times a day (BID) | OPHTHALMIC | 1 refills | Status: DC

## 2023-06-19 MED ORDER — DULOXETINE HCL 30 MG PO CPEP: 30.0000 mg | ORAL_CAPSULE | Freq: Every day | ORAL | 3 refills | Status: DC

## 2023-06-19 MED ORDER — OMEPRAZOLE 40 MG PO CPDR: 40.0000 mg | DELAYED_RELEASE_CAPSULE | Freq: Every day | ORAL | 3 refills | Status: DC

## 2023-06-19 NOTE — Telephone Encounter (Signed)
sent 

## 2023-08-27 ENCOUNTER — Inpatient Hospital Stay: Payer: 59

## 2023-08-30 ENCOUNTER — Inpatient Hospital Stay: Payer: 59 | Attending: Oncology

## 2023-08-30 DIAGNOSIS — R7989 Other specified abnormal findings of blood chemistry: Secondary | ICD-10-CM

## 2023-08-30 LAB — CBC WITH DIFFERENTIAL/PLATELET
Abs Immature Granulocytes: 0.01 10*3/uL (ref 0.00–0.07)
Basophils Absolute: 0 10*3/uL (ref 0.0–0.1)
Basophils Relative: 1 %
Eosinophils Absolute: 0 10*3/uL (ref 0.0–0.5)
Eosinophils Relative: 0 %
HCT: 41.1 % (ref 36.0–46.0)
Hemoglobin: 13.9 g/dL (ref 12.0–15.0)
Immature Granulocytes: 0 %
Lymphocytes Relative: 31 %
Lymphs Abs: 1.3 10*3/uL (ref 0.7–4.0)
MCH: 35.5 pg — ABNORMAL HIGH (ref 26.0–34.0)
MCHC: 33.8 g/dL (ref 30.0–36.0)
MCV: 104.8 fL — ABNORMAL HIGH (ref 80.0–100.0)
Monocytes Absolute: 1.1 10*3/uL — ABNORMAL HIGH (ref 0.1–1.0)
Monocytes Relative: 24 %
Neutro Abs: 1.9 10*3/uL (ref 1.7–7.7)
Neutrophils Relative %: 44 %
Platelets: 265 10*3/uL (ref 150–400)
RBC: 3.92 MIL/uL (ref 3.87–5.11)
RDW: 13.3 % (ref 11.5–15.5)
WBC: 4.3 10*3/uL (ref 4.0–10.5)
nRBC: 0 % (ref 0.0–0.2)

## 2023-08-30 LAB — IRON AND TIBC
Iron: 177 ug/dL — ABNORMAL HIGH (ref 28–170)
Saturation Ratios: 61 % — ABNORMAL HIGH (ref 10.4–31.8)
TIBC: 289 ug/dL (ref 250–450)
UIBC: 112 ug/dL

## 2023-08-30 LAB — FERRITIN: Ferritin: 197 ng/mL (ref 11–307)

## 2023-09-03 ENCOUNTER — Inpatient Hospital Stay: Payer: 59 | Attending: Oncology | Admitting: Oncology

## 2023-11-08 ENCOUNTER — Ambulatory Visit: Admitting: Family Medicine

## 2023-11-12 ENCOUNTER — Ambulatory Visit

## 2023-11-12 DIAGNOSIS — Z23 Encounter for immunization: Secondary | ICD-10-CM | POA: Diagnosis not present

## 2023-11-12 DIAGNOSIS — E538 Deficiency of other specified B group vitamins: Secondary | ICD-10-CM

## 2023-11-12 DIAGNOSIS — E559 Vitamin D deficiency, unspecified: Secondary | ICD-10-CM | POA: Diagnosis not present

## 2023-11-12 DIAGNOSIS — R748 Abnormal levels of other serum enzymes: Secondary | ICD-10-CM

## 2023-11-12 DIAGNOSIS — E038 Other specified hypothyroidism: Secondary | ICD-10-CM

## 2023-11-12 DIAGNOSIS — R7301 Impaired fasting glucose: Secondary | ICD-10-CM | POA: Diagnosis not present

## 2023-11-12 DIAGNOSIS — Z13220 Encounter for screening for lipoid disorders: Secondary | ICD-10-CM

## 2023-11-12 DIAGNOSIS — R295 Transient paralysis: Secondary | ICD-10-CM

## 2023-11-12 DIAGNOSIS — K279 Peptic ulcer, site unspecified, unspecified as acute or chronic, without hemorrhage or perforation: Secondary | ICD-10-CM

## 2023-11-12 NOTE — Progress Notes (Unsigned)
 Acute Office Visit  Subjective:     Patient ID: Marissa Frank, female    DOB: [birth year], 53 y.o.   MRN: [id number]  Chief Complaint  Patient presents with   Care Management    Pt states" When sleeping and more when awake she tenses up, can't move,talk and her toes go numb, only can move her eyes"     HPI Patient is in today for Seizure Disorder: Episodes first started gradual, starting about 3 months ago. Onset of symptoms was sudden, not related to any specific activity. She has had multiple episodes. She has had no loss of consciousness. Abnormal movements tonic movements only. Episode duration is a few seconds. Premonitory symptoms include none. After episodes she has lethargy. Seizures appear to be precipitated by no precipitation factors noted. Symptoms associated with seizures are awakening exhausted with muscle aches. Patient does not have a history of head trauma.  She does not have a history of CVA. She does not have a history of alcohol abuse. She does not have a history of drug abuse. She does not have a history of developmental delay. She does not have a family history of seizure disorder. Work up thus far has been: none. Treatment thus far has been: none, with N/A results.   ROS      Objective:    BP 113/77   Pulse 64   Ht 5\' 1"  (1.549 m)   Wt 85 lb 1.9 oz (38.6 kg)   LMP 06/29/2018   SpO2 98%   BMI 16.08 kg/m    Physical Exam Vitals and nursing note reviewed.  Constitutional:      Appearance: Normal appearance.  HENT:     Head: Normocephalic.     Right Ear: Tympanic membrane, ear canal and external ear normal.     Left Ear: Tympanic membrane, ear canal and external ear normal.     Nose: Nose normal.     Mouth/Throat:     Mouth: Mucous membranes are moist.     Pharynx: Oropharynx is clear.  Eyes:     Extraocular Movements: Extraocular movements intact.     Pupils: Pupils are equal, round, and reactive to light.  Cardiovascular:     Rate and  Rhythm: Normal rate and regular rhythm.  Pulmonary:     Effort: Pulmonary effort is normal.     Breath sounds: Normal breath sounds.  Musculoskeletal:        General: Normal range of motion.     Cervical back: Normal range of motion and neck supple.  Skin:    General: Skin is warm and dry.  Neurological:     Mental Status: She is alert and oriented to person, place, and time.  Psychiatric:        Attention and Perception: Attention and perception normal.        Mood and Affect: Mood normal.        Speech: Speech normal.        Behavior: Behavior normal. Behavior is cooperative.        Thought Content: Thought content normal.           Assessment & Plan:   Problem List Items Addressed This Visit       Endocrine   IFG (impaired fasting glucose)   Check labs today for reevaluation on condition.        Relevant Orders   CMP14+EGFR (Completed)     Other   Iron disorder - Primary   Was evaluated  by hematology last year for this and was told to stop oral iron supplementation she also had an MRI of her liver in November 2024 for further evaluation of possible iron overload.  Unfortunately she was unable to make it to her follow-up appointments earlier this year.  We will recheck levels today.  Follow-up according to lab results      Relevant Orders   CBC with Differential/Platelet (Completed)   Fe+TIBC+Fer (Completed)   Vitamin B12 deficiency   Check labs today for reevaluation on condition.       Relevant Orders   B12 and Folate Panel (Completed)   Lipid screening   Check labs today for reevaluation on condition.       Relevant Orders   Lipid Profile (Completed)   TSH + free T4 (Completed)   Transient paralysis   Unremarkable exam in office today.  Patient is neurologically intact.  Will check labs today and refer to neurology for further evaluation.  Pt was provided with referral information.       Relevant Orders   TSH + free T4 (Completed)   Ambulatory  referral to Neurology   Need for Streptococcus pneumoniae vaccination   Administered in office.        Other Visit Diagnoses       Vitamin D deficiency       Relevant Orders   Vitamin D (25 hydroxy) (Completed)     Encounter for immunization       Relevant Orders   Pneumococcal conjugate vaccine 20-valent (Completed)       No orders of the defined types were placed in this encounter.   No follow-ups on file.  Alison Irvine, FNP

## 2023-11-13 DIAGNOSIS — Z13220 Encounter for screening for lipoid disorders: Secondary | ICD-10-CM | POA: Insufficient documentation

## 2023-11-13 DIAGNOSIS — R7301 Impaired fasting glucose: Secondary | ICD-10-CM | POA: Insufficient documentation

## 2023-11-13 DIAGNOSIS — Z23 Encounter for immunization: Secondary | ICD-10-CM | POA: Insufficient documentation

## 2023-11-13 DIAGNOSIS — E038 Other specified hypothyroidism: Secondary | ICD-10-CM | POA: Insufficient documentation

## 2023-11-13 DIAGNOSIS — E538 Deficiency of other specified B group vitamins: Secondary | ICD-10-CM | POA: Insufficient documentation

## 2023-11-13 DIAGNOSIS — R295 Transient paralysis: Secondary | ICD-10-CM | POA: Insufficient documentation

## 2023-11-13 NOTE — Assessment & Plan Note (Signed)
 Check labs today for reevaluation on condition.

## 2023-11-13 NOTE — Assessment & Plan Note (Signed)
 Was evaluated by hematology last year for this and was told to stop oral iron supplementation she also had an MRI of her liver in November 2024 for further evaluation of possible iron overload.  Unfortunately she was unable to make it to her follow-up appointments earlier this year.  We will recheck levels today.  Follow-up according to lab results

## 2023-11-13 NOTE — Assessment & Plan Note (Signed)
Administered in office.

## 2023-11-13 NOTE — Assessment & Plan Note (Signed)
 Unremarkable exam in office today.  Patient is neurologically intact.  Will check labs today and refer to neurology for further evaluation.  Pt was provided with referral information.

## 2023-11-14 LAB — CMP14+EGFR
ALT: 43 IU/L — ABNORMAL HIGH (ref 0–32)
AST: 192 IU/L — ABNORMAL HIGH (ref 0–40)
Albumin: 4.2 g/dL (ref 3.8–4.9)
Alkaline Phosphatase: 135 IU/L — ABNORMAL HIGH (ref 44–121)
BUN/Creatinine Ratio: 8 — ABNORMAL LOW (ref 9–23)
BUN: 5 mg/dL — ABNORMAL LOW (ref 6–24)
Bilirubin Total: 0.4 mg/dL (ref 0.0–1.2)
CO2: 18 mmol/L — ABNORMAL LOW (ref 20–29)
Calcium: 9.9 mg/dL (ref 8.7–10.2)
Chloride: 104 mmol/L (ref 96–106)
Creatinine, Ser: 0.6 mg/dL (ref 0.57–1.00)
Globulin, Total: 2.3 g/dL (ref 1.5–4.5)
Glucose: 69 mg/dL — ABNORMAL LOW (ref 70–99)
Potassium: 4.6 mmol/L (ref 3.5–5.2)
Sodium: 142 mmol/L (ref 134–144)
Total Protein: 6.5 g/dL (ref 6.0–8.5)
eGFR: 106 mL/min/{1.73_m2} (ref >59)

## 2023-11-14 LAB — LIPID PANEL
Chol/HDL Ratio: 1.6 ratio (ref 0.0–4.4)
Cholesterol, Total: 247 mg/dL — ABNORMAL HIGH (ref 100–199)
HDL: 153 mg/dL (ref >39)
LDL Chol Calc (NIH): 80 mg/dL (ref 0–99)
Triglycerides: 89 mg/dL (ref 0–149)
VLDL Cholesterol Cal: 14 mg/dL (ref 5–40)

## 2023-11-14 LAB — CBC WITH DIFFERENTIAL/PLATELET
Basophils Absolute: 0 10*3/uL (ref 0.0–0.2)
Basos: 1 %
EOS (ABSOLUTE): 0 10*3/uL (ref 0.0–0.4)
Eos: 1 %
Hematocrit: 37.6 % (ref 34.0–46.6)
Hemoglobin: 13 g/dL (ref 11.1–15.9)
Immature Grans (Abs): 0 10*3/uL (ref 0.0–0.1)
Immature Granulocytes: 0 %
Lymphocytes Absolute: 1.3 10*3/uL (ref 0.7–3.1)
Lymphs: 33 %
MCH: 35 pg — ABNORMAL HIGH (ref 26.6–33.0)
MCHC: 34.6 g/dL (ref 31.5–35.7)
MCV: 101 fL — ABNORMAL HIGH (ref 79–97)
Monocytes Absolute: 0.8 10*3/uL (ref 0.1–0.9)
Monocytes: 19 %
Neutrophils Absolute: 1.9 10*3/uL (ref 1.4–7.0)
Neutrophils: 46 %
Platelets: 288 10*3/uL (ref 150–450)
RBC: 3.71 x10E6/uL — ABNORMAL LOW (ref 3.77–5.28)
RDW: 12.8 % (ref 11.7–15.4)
WBC: 4.1 10*3/uL (ref 3.4–10.8)

## 2023-11-14 LAB — IRON,TIBC AND FERRITIN PANEL
Ferritin: 315 ng/mL — ABNORMAL HIGH (ref 15–150)
Iron Saturation: 50 % (ref 15–55)
Iron: 125 ug/dL (ref 27–159)
Total Iron Binding Capacity: 248 ug/dL — ABNORMAL LOW (ref 250–450)
UIBC: 123 ug/dL — ABNORMAL LOW (ref 131–425)

## 2023-11-14 LAB — TSH+FREE T4
Free T4: 0.94 ng/dL (ref 0.82–1.77)
TSH: 1.84 u[IU]/mL (ref 0.450–4.500)

## 2023-11-14 LAB — VITAMIN D 25 HYDROXY (VIT D DEFICIENCY, FRACTURES): Vit D, 25-Hydroxy: 8.2 ng/mL — ABNORMAL LOW (ref 30.0–100.0)

## 2023-11-14 LAB — B12 AND FOLATE PANEL
Folate: 4.3 ng/mL (ref >3.0)
Vitamin B-12: 575 pg/mL (ref 232–1245)

## 2023-11-19 ENCOUNTER — Telehealth: Payer: Self-pay

## 2023-11-19 ENCOUNTER — Other Ambulatory Visit: Payer: Self-pay

## 2023-11-19 NOTE — Telephone Encounter (Signed)
 Noted.

## 2023-11-19 NOTE — Telephone Encounter (Signed)
 Patient called has not seen any medication sent into her pharmacy as discuss in her last visit 04.15.2025with Alison Irvine.  Does not remember the medicine that was suppose to be called into her pharmacy.  Pharmacy: [facility]

## 2023-11-20 MED ORDER — SOLIFENACIN SUCCINATE 5 MG PO TABS: 5.0000 mg | ORAL_TABLET | Freq: Every day | ORAL | 3 refills | Status: DC

## 2023-11-20 MED ORDER — VITAMIN D3 50 MCG (2000 UT) PO CAPS: 2000.0000 [IU] | ORAL_CAPSULE | Freq: Every day | ORAL | 2 refills | Status: DC

## 2023-11-20 MED ORDER — EYE DROPS 0.012-0.2 % OP SOLN: OPHTHALMIC | 0 refills | Status: DC

## 2023-11-20 MED ORDER — OMEPRAZOLE 40 MG PO CPDR: 40.0000 mg | DELAYED_RELEASE_CAPSULE | Freq: Every day | ORAL | 3 refills | Status: DC

## 2023-11-20 MED ORDER — CYCLOSPORINE 0.05 % OP EMUL: 1.0000 [drp] | Freq: Two times a day (BID) | OPHTHALMIC | 1 refills | Status: DC

## 2023-11-20 MED ORDER — DULOXETINE HCL 30 MG PO CPEP: 30.0000 mg | ORAL_CAPSULE | Freq: Every day | ORAL | 3 refills | Status: DC

## 2023-11-20 MED ORDER — LINACLOTIDE 290 MCG PO CAPS: 290.0000 ug | ORAL_CAPSULE | Freq: Every day | ORAL | 3 refills | Status: AC

## 2023-11-20 MED ORDER — GABAPENTIN 300 MG PO CAPS: 300.0000 mg | ORAL_CAPSULE | Freq: Three times a day (TID) | ORAL | 3 refills | Status: DC

## 2023-11-23 ENCOUNTER — Encounter (HOSPITAL_COMMUNITY): Payer: Self-pay

## 2023-11-23 ENCOUNTER — Emergency Department (HOSPITAL_COMMUNITY)
Admission: EM | Admit: 2023-11-23 | Discharge: 2023-11-24 | Disposition: A | Discharge status: Home / Self Care | Attending: Emergency Medicine | Admitting: Emergency Medicine

## 2023-11-23 ENCOUNTER — Other Ambulatory Visit: Payer: Self-pay

## 2023-11-23 ENCOUNTER — Emergency Department (HOSPITAL_COMMUNITY)

## 2023-11-23 DIAGNOSIS — R1030 Lower abdominal pain, unspecified: Secondary | ICD-10-CM | POA: Insufficient documentation

## 2023-11-23 DIAGNOSIS — Z7951 Long term (current) use of inhaled steroids: Secondary | ICD-10-CM | POA: Diagnosis not present

## 2023-11-23 DIAGNOSIS — J45909 Unspecified asthma, uncomplicated: Secondary | ICD-10-CM | POA: Diagnosis not present

## 2023-11-23 DIAGNOSIS — D259 Leiomyoma of uterus, unspecified: Secondary | ICD-10-CM

## 2023-11-23 DIAGNOSIS — Z8541 Personal history of malignant neoplasm of cervix uteri: Secondary | ICD-10-CM | POA: Insufficient documentation

## 2023-11-23 LAB — COMPREHENSIVE METABOLIC PANEL WITH GFR
ALT: 52 U/L — ABNORMAL HIGH (ref 0–44)
AST: 147 U/L — ABNORMAL HIGH (ref 15–41)
Albumin: 3.8 g/dL (ref 3.5–5.0)
Alkaline Phosphatase: 115 U/L (ref 38–126)
Anion gap: 13 (ref 5–15)
BUN: <5 mg/dL — ABNORMAL LOW (ref 6–20)
CO2: 21 mmol/L — ABNORMAL LOW (ref 22–32)
Calcium: 9.3 mg/dL (ref 8.9–10.3)
Chloride: 104 mmol/L (ref 98–111)
Creatinine, Ser: 0.55 mg/dL (ref 0.44–1.00)
GFR, Estimated: >60 mL/min (ref >60)
Glucose, Bld: 74 mg/dL (ref 70–99)
Potassium: 3.3 mmol/L — ABNORMAL LOW (ref 3.5–5.1)
Sodium: 138 mmol/L (ref 135–145)
Total Bilirubin: 1 mg/dL (ref 0.0–1.2)
Total Protein: 6.8 g/dL (ref 6.5–8.1)

## 2023-11-23 LAB — CBC
HCT: 36.8 % (ref 36.0–46.0)
Hemoglobin: 12.4 g/dL (ref 12.0–15.0)
MCH: 35 pg — ABNORMAL HIGH (ref 26.0–34.0)
MCHC: 33.7 g/dL (ref 30.0–36.0)
MCV: 104 fL — ABNORMAL HIGH (ref 80.0–100.0)
Platelets: 251 10*3/uL (ref 150–400)
RBC: 3.54 MIL/uL — ABNORMAL LOW (ref 3.87–5.11)
RDW: 13.8 % (ref 11.5–15.5)
WBC: 3.9 10*3/uL — ABNORMAL LOW (ref 4.0–10.5)
nRBC: 0 % (ref 0.0–0.2)

## 2023-11-23 LAB — WET PREP, GENITAL
Clue Cells Wet Prep HPF POC: NONE SEEN
Sperm: NONE SEEN
Trich, Wet Prep: NONE SEEN
WBC, Wet Prep HPF POC: <10 — AB (ref <10)
Yeast Wet Prep HPF POC: NONE SEEN

## 2023-11-23 LAB — LIPASE, BLOOD: Lipase: 44 U/L (ref 11–51)

## 2023-11-23 MED ORDER — KETOROLAC TROMETHAMINE 30 MG/ML IJ SOLN
30.0000 mg | Freq: Once | INTRAMUSCULAR | Status: AC
  Administered 2023-11-23: 30 mg via INTRAVENOUS
  Filled 2023-11-23: qty 1

## 2023-11-23 MED ORDER — MORPHINE SULFATE (PF) 4 MG/ML IV SOLN
4.0000 mg | Freq: Once | INTRAVENOUS | Status: AC
  Administered 2023-11-23: 4 mg via INTRAVENOUS
  Filled 2023-11-23: qty 1

## 2023-11-23 MED ORDER — IOHEXOL 300 MG/ML  SOLN
100.0000 mL | Freq: Once | INTRAMUSCULAR | Status: AC | PRN
  Administered 2023-11-23: 100 mL via INTRAVENOUS

## 2023-11-23 MED ORDER — SODIUM CHLORIDE 0.9 % IV BOLUS
1000.0000 mL | Freq: Once | INTRAVENOUS | Status: AC
  Administered 2023-11-23: 1000 mL via INTRAVENOUS

## 2023-11-23 MED ORDER — ONDANSETRON HCL 4 MG/2ML IJ SOLN
4.0000 mg | Freq: Once | INTRAMUSCULAR | Status: AC
  Administered 2023-11-23: 4 mg via INTRAVENOUS
  Filled 2023-11-23: qty 2

## 2023-11-23 NOTE — ED Triage Notes (Signed)
 Pt coming in for lower abd pain that started Thursday and the pain has been progressively worsening. She mentions the pain is bilateral in her lower abd. She also reports that the inside of her vagina hurts as well, she did try to at home medication at home but nothing is working. She is otherwise stable at this time and pleasant in triage.

## 2023-11-23 NOTE — ED Provider Notes (Signed)
 [facility] AT [facility] Provider Note   CSN: [id number] Arrival date & time: 11/23/23  2050     History  Chief Complaint  Patient presents with   Abdominal Pain    AAIRAH FAYSON is a 53 y.o. female.  Pt is a 53 yo female with pmhx significant for asthma, cervical cancer and depression.  Pt said she developed lower abd and vaginal pain after having sex with her long time partner.  Pt said they had not had sex in years and it was painful.  Pt denies f/c.         Home Medications Prior to Admission medications   Medication Sig Start Date End Date Taking? Authorizing Provider  acetaminophen  (TYLENOL ) 500 MG tablet Take 500 mg by mouth every 6 (six) hours as needed.    [provider]  albuterol  (PROVENTIL ) (2.5 MG/3ML) 0.083% nebulizer solution Take 3 mLs (2.5 mg total) by nebulization every 6 (six) hours as needed for wheezing or shortness of breath. 08/23/22   Wilhemena Harbour, NP  albuterol  (VENTOLIN  HFA) 108 (90 Base) MCG/ACT inhaler Inhale 1-2 puffs into the lungs every 6 (six) hours as needed for wheezing or shortness of breath. 08/23/22   Wilhemena Harbour, NP  benzonatate  (TESSALON ) 100 MG capsule Take 1 capsule (100 mg total) by mouth 3 (three) times daily as needed for cough. Do not take with alcohol or while driving or operating heavy machinery.  May cause drowsiness. 08/23/22   Wilhemena Harbour, NP  Cholecalciferol (VITAMIN D3) 50 MCG (2000 UT) capsule Take 1 capsule (2,000 Units total) by mouth daily. 11/20/23   Del Orbe Polanco, Iliana, FNP  cycloSPORINE  (RESTASIS ) 0.05 % ophthalmic emulsion Place 1 drop into both eyes 2 (two) times daily. 11/20/23   Del Orbe Polanco, Iliana, FNP  DULoxetine  (CYMBALTA ) 30 MG capsule Take 1 capsule (30 mg total) by mouth daily. 11/20/23   Del Orbe Polanco, Iliana, FNP  fluticasone  (FLONASE ) 50 MCG/ACT nasal spray Place 1 spray into both nostrils daily. 08/23/22   Wilhemena Harbour, NP   folic acid  (FOLVITE ) 1 MG tablet Take 1 tablet (1 mg total) by mouth daily. 06/05/23   Kandala, Hyndavi, MD  gabapentin  (NEURONTIN ) 300 MG capsule Take 1 capsule (300 mg total) by mouth 3 (three) times daily. 11/20/23   Del Orbe Polanco, Iliana, FNP  linaclotide  (LINZESS ) 290 MCG CAPS capsule Take 1 capsule (290 mcg total) by mouth daily before breakfast. 11/20/23   Del Orbe Polanco, Iliana, FNP  Naphazoline-Polyethyl Glycol (EYE DROPS) 0.012-0.2 % SOLN One drop each eye as needed 11/20/23   Del Orbe Polanco, Iliana, FNP  omeprazole  (PRILOSEC) 40 MG capsule Take 1 capsule (40 mg total) by mouth daily. 30 minutes before breakfast 11/20/23   Del Amber Bail, [place], FNP  solifenacin  (VESICARE ) 5 MG tablet Take 1 tablet (5 mg total) by mouth daily. 11/20/23   Del Abron Abt, FNP      Allergies    Almond oil, Carrot [daucus carota], Celery oil, Coconut flavoring agent (non-screening), and Macadamia nut oil    Review of Systems   Review of Systems  Gastrointestinal:  Positive for abdominal pain.  Genitourinary:  Positive for vaginal pain.  All other systems reviewed and are negative.   Physical Exam Updated Vital Signs BP 106/71   Pulse 61   Temp 98.3 F (36.8 C)   Resp 18   LMP 06/29/2018   SpO2 100%  Physical Exam Vitals and nursing note  reviewed. Exam conducted with a chaperone present.  Constitutional:      Appearance: She is well-developed.  HENT:     Head: Normocephalic and atraumatic.     Mouth/Throat:     Mouth: Mucous membranes are moist.     Pharynx: Oropharynx is clear.  Eyes:     Extraocular Movements: Extraocular movements intact.     Pupils: Pupils are equal, round, and reactive to light.  Cardiovascular:     Rate and Rhythm: Normal rate and regular rhythm.     Heart sounds: Normal heart sounds.  Abdominal:     General: Abdomen is flat. Bowel sounds are normal.     Palpations: Abdomen is soft.     Tenderness: There is generalized abdominal tenderness.   Genitourinary:    Vagina: Normal.     Cervix: Normal.     Uterus: Normal.      Adnexa: Right adnexa normal and left adnexa normal.  Skin:    General: Skin is warm.     Capillary Refill: Capillary refill takes less than 2 seconds.  Neurological:     General: No focal deficit present.     Mental Status: She is alert and oriented to person, place, and time.  Psychiatric:        Mood and Affect: Mood normal.        Behavior: Behavior normal.     ED Results / Procedures / Treatments   Labs (all labs ordered are listed, but only abnormal results are displayed) Labs Reviewed  WET PREP, GENITAL - Abnormal; Notable for the following components:      Result Value   WBC, Wet Prep HPF POC <10 (*)    All other components within normal limits  COMPREHENSIVE METABOLIC PANEL WITH GFR - Abnormal; Notable for the following components:   Potassium 3.3 (*)    CO2 21 (*)    BUN <5 (*)    AST 147 (*)    ALT 52 (*)    All other components within normal limits  CBC - Abnormal; Notable for the following components:   WBC 3.9 (*)    RBC 3.54 (*)    MCV 104.0 (*)    MCH 35.0 (*)    All other components within normal limits  LIPASE, BLOOD  URINALYSIS, ROUTINE W REFLEX MICROSCOPIC  HIV ANTIBODY (ROUTINE TESTING W REFLEX)  GC/CHLAMYDIA PROBE AMP ([place]) NOT AT [facility]    EKG None  Radiology No results found.  Procedures Procedures    Medications Ordered in ED Medications  sodium chloride  0.9 % bolus 1,000 mL (0 mLs Intravenous Stopped 11/23/23 2304)  ketorolac  (TORADOL ) 30 MG/ML injection 30 mg (30 mg Intravenous Given 11/23/23 2201)  ondansetron  (ZOFRAN ) injection 4 mg (4 mg Intravenous Given 11/23/23 2204)  morphine  (PF) 4 MG/ML injection 4 mg (4 mg Intravenous Given 11/23/23 2245)  iohexol  (OMNIPAQUE ) 300 MG/ML solution 100 mL (100 mLs Intravenous Contrast Given 11/23/23 2326)    ED Course/ Medical Decision Making/ A&P                                 Medical Decision  Making Amount and/or Complexity of Data Reviewed Labs: ordered. Radiology: ordered.  Risk Prescription drug management.   This patient presents to the ED for concern of abd pain, this involves an extensive number of treatment options, and is a complaint that carries with it a high risk of complications and morbidity.  The differential diagnosis includes uti, cervicitis, appy, diverticulitis, pelvic wall rupture   Co morbidities that complicate the patient evaluation  asthma, cervical cancer and depression   Additional history obtained:  Additional history obtained from epic chart review  Lab Tests:  I Ordered, and personally interpreted labs.  The pertinent results include:  cbc nl, cmp nl other than mild elevation of lfts, wet prep neg   Imaging Studies ordered:  I ordered imaging studies including ct abd/pelvis  Pending at shift change  Medicines ordered and prescription drug management:  I ordered medication including zofran /morphine /toradol Rip Cheese  for sx  Reevaluation of the patient after these medicines showed that the patient improved I have reviewed the patients home medicines and have made adjustments as needed   Test Considered:  ct  Problem List / ED Course:  Abd pain:  etiology unclear.  Ua and ct pending at shift change.    Reevaluation:  After the interventions noted above, I reevaluated the patient and found that they have :improved   Social Determinants of Health:  Lives at home   Dispostion:  Pending at shift change        Final Clinical Impression(s) / ED Diagnoses Final diagnoses:  Lower abdominal pain    Rx / DC Orders ED Discharge Orders     None         Sueellen Emery, MD 11/23/23 2344

## 2023-11-24 LAB — URINALYSIS, ROUTINE W REFLEX MICROSCOPIC
Bilirubin Urine: NEGATIVE
Glucose, UA: NEGATIVE mg/dL
Hgb urine dipstick: NEGATIVE
Ketones, ur: NEGATIVE mg/dL
Leukocytes,Ua: NEGATIVE
Nitrite: NEGATIVE
Protein, ur: NEGATIVE mg/dL
Specific Gravity, Urine: 1.026 (ref 1.005–1.030)
pH: 5 (ref 5.0–8.0)

## 2023-11-24 MED ORDER — HYDROCODONE-ACETAMINOPHEN 5-325 MG PO TABS: 1.0000 | ORAL_TABLET | Freq: Four times a day (QID) | ORAL | 0 refills | Status: DC | PRN

## 2023-11-24 MED ORDER — ONDANSETRON 4 MG PO TBDP: 4.0000 mg | ORAL_TABLET | Freq: Three times a day (TID) | ORAL | 0 refills | Status: DC | PRN

## 2023-11-24 NOTE — ED Provider Notes (Signed)
 Care assumed at shift change. Here for lower abdominal pain after having sex for the first time in several years. History of cervical cancer, has not been getting regular surveillance. Pending CT and Urine at shift change.  Physical Exam  BP 120/71   Pulse 75   Temp 98.3 F (36.8 C)   Resp 16   LMP 06/29/2018   SpO2 96%   Physical Exam  Procedures  Procedures  ED Course / MDM   Clinical Course as of 11/24/23 0110  Sun Nov 24, 2023  0107 UA is clear. I personally viewed the images from radiology studies and agree with radiologist interpretation: CT neg for acute process. Shows fibroid uterus which may be contributing.   Patient reports pain is improved and she is comfortable going home. Referral to Gyn for further management. RTED for any other concerns. [CS]    Clinical Course User Index [CS] Charmayne Cooper, MD   Medical Decision Making Problems Addressed: Lower abdominal pain: acute illness or injury Uterine leiomyoma, unspecified location: acute illness or injury  Amount and/or Complexity of Data Reviewed Labs:  Decision-making details documented in ED Course. Radiology: independent interpretation performed. Decision-making details documented in ED Course.  Risk Prescription drug management. Parenteral controlled substances.          Charmayne Cooper, MD 11/24/23 Katrina Parma

## 2023-11-25 LAB — GC/CHLAMYDIA PROBE AMP (~~LOC~~) NOT AT ARMC
Chlamydia: NEGATIVE
Comment: NEGATIVE
Comment: NORMAL
Neisseria Gonorrhea: NEGATIVE

## 2023-11-28 ENCOUNTER — Other Ambulatory Visit: Payer: Self-pay

## 2023-12-01 ENCOUNTER — Other Ambulatory Visit: Payer: Self-pay

## 2023-12-01 DIAGNOSIS — E559 Vitamin D deficiency, unspecified: Secondary | ICD-10-CM

## 2023-12-01 MED ORDER — VITAMIN D (ERGOCALCIFEROL) 1.25 MG (50000 UNIT) PO CAPS: 50000.0000 [IU] | ORAL_CAPSULE | ORAL | 5 refills | Status: DC

## 2023-12-04 ENCOUNTER — Telehealth: Payer: Self-pay

## 2023-12-04 NOTE — Telephone Encounter (Signed)
 Attempted to contact pt, call could not be completed as dialed. Prescription has been sent in by b PCP on 12/01/23 to [facility].   Copied from CRM [id number]. Topic: Appointments - Appointment Scheduling >> Dec 04, 2023  3:42 PM Emylou G wrote: Hospital called her - said she had to be seen for this below.  Is this accurate before she makes the appt with us ? Or can she get this filled..Preferred pharm is [facility]  Med refill.. Vitamin D , Ergocalciferol , (DRISDOL) 1.25 MG (50000 UNIT) CAPS capsule..Was also waiting for eye drops.

## 2023-12-06 ENCOUNTER — Ambulatory Visit (INDEPENDENT_AMBULATORY_CARE_PROVIDER_SITE_OTHER): Payer: Self-pay

## 2023-12-06 VITALS — BP 129/79 | HR 53 | Ht 61.0 in | Wt 84.1 lb

## 2023-12-06 DIAGNOSIS — R1030 Lower abdominal pain, unspecified: Secondary | ICD-10-CM | POA: Diagnosis not present

## 2023-12-06 MED ORDER — CYCLOSPORINE 0.05 % OP EMUL: 1.0000 [drp] | Freq: Two times a day (BID) | OPHTHALMIC | 5 refills | Status: DC

## 2023-12-06 MED ORDER — ALBUTEROL SULFATE HFA 108 (90 BASE) MCG/ACT IN AERS
1.0000 | INHALATION_SPRAY | Freq: Four times a day (QID) | RESPIRATORY_TRACT | 2 refills | Status: AC | PRN
Start: 2023-12-06 — End: ?

## 2023-12-06 NOTE — Progress Notes (Signed)
 Established Patient Office Visit  Subjective   Patient ID: Marissa Frank, female    DOB: 06-Mar-1971  Age: 53 y.o. MRN: [id number]  Chief Complaint  Patient presents with   Hospitalization Follow-up    Pt here for hospital follow up    HPI  Pt is a 53 yo female with pmhx significant for asthma, cervical cancer and depression who reported to the ER on 11/23/23/  Pt said she developed lower abd and vaginal pain after having sex with her long time partner. Pt said they had not had sex in years and it was painful. Pt denies f/c.    Patient Active Problem List   Diagnosis Date Noted   IFG (impaired fasting glucose) 11/13/2023   Vitamin B12 deficiency 11/13/2023   TSH (thyroid-stimulating hormone deficiency) 11/13/2023   Lipid screening 11/13/2023   Transient paralysis 11/13/2023   Need for Streptococcus pneumoniae vaccination 11/13/2023   Iron disorder 06/03/2023   Elevated liver enzymes 06/03/2023   Tobacco use 06/03/2023   Dysuria 05/10/2023   Diffuse pain 05/10/2023   Prolapsed internal hemorrhoids, grade 3 12/21/2021   GERD (gastroesophageal reflux disease) 11/21/2021   Severe episode of recurrent major depressive disorder, with psychotic features (HCC)    Neuropathic pain 09/12/2021   Bloody discharge from left nipple 06/15/2021   Dysphagia 11/16/2020   Peptic ulcer disease 11/16/2020   Constipation 04/18/2018   Rectal bleeding 04/18/2018   Normocytic anemia 04/18/2018   Abdominal pain, chronic, epigastric 04/18/2018   Past Medical History:  Diagnosis Date   Allergy    Asthma    Cervical cancer (HCC)    Depression    Hemorrhoids       ROS    Objective:     BP 129/79   Pulse (!) 53   Ht 5\' 1"  (1.549 m)   Wt 84 lb 1.9 oz (38.2 kg)   LMP 06/29/2018   SpO2 97%   BMI 15.89 kg/m  BP Readings from Last 3 Encounters:  12/06/23 129/79  11/24/23 116/71  11/12/23 113/77   Wt Readings from Last 3 Encounters:  12/06/23 84 lb 1.9 oz (38.2 kg)   11/12/23 85 lb 1.9 oz (38.6 kg)  06/03/23 84 lb 14.4 oz (38.5 kg)      Physical Exam Vitals and nursing note reviewed.  Constitutional:      Appearance: Normal appearance.  Eyes:     Extraocular Movements: Extraocular movements intact.     Pupils: Pupils are equal, round, and reactive to light.  Cardiovascular:     Rate and Rhythm: Normal rate and regular rhythm.  Pulmonary:     Effort: Pulmonary effort is normal.     Breath sounds: Normal breath sounds.  Abdominal:     General: Abdomen is flat. Bowel sounds are normal.     Tenderness: There is abdominal tenderness in the suprapubic area. There is no right CVA tenderness or left CVA tenderness.  Neurological:     Mental Status: She is alert and oriented to person, place, and time.  Psychiatric:        Mood and Affect: Mood normal.        Thought Content: Thought content normal.      No results found for any visits on 12/06/23.    The ASCVD Risk score (Arnett DK, et al., 2019) failed to calculate for the following reasons:   The valid HDL cholesterol range is 20 to 100 mg/dL    Assessment & Plan:   Problem List Items  Addressed This Visit   None Visit Diagnoses       Lower abdominal pain    -  Primary   Recent ER report and imaging studies were reviewed.  pt reports improvement of symtpoms.  recommend ob/gyn consult for further evaluation.       No follow-ups on file.    Alison Irvine, FNP

## 2023-12-31 ENCOUNTER — Ambulatory Visit: Admitting: Neurology

## 2024-02-12 ENCOUNTER — Telehealth: Payer: Self-pay

## 2024-02-12 NOTE — Telephone Encounter (Signed)
 Copied from CRM [id number]. Topic: Clinical - Medical Advice >> Feb 12, 2024 12:16 PM Sasha H wrote: Reason for CRM: Pt is wanting to know if the office does mold exposure testing.

## 2024-02-13 ENCOUNTER — Other Ambulatory Visit: Payer: Self-pay | Admitting: Family Medicine

## 2024-02-13 NOTE — Telephone Encounter (Signed)
 Unable to reach pt, phone rings and rings, no vm picks up

## 2024-02-13 NOTE — Telephone Encounter (Signed)
 No, but I can place a referral to allergist if patient is interested

## 2024-03-09 ENCOUNTER — Ambulatory Visit

## 2024-03-09 ENCOUNTER — Ambulatory Visit: Payer: Self-pay

## 2024-03-09 NOTE — Telephone Encounter (Signed)
 An appt is needed before prescriptions can be sent for cough bc its a new issue. Can reschedule appt with an available provider or she can use the otc meds first and call back if symptoms do not improve

## 2024-03-09 NOTE — Telephone Encounter (Signed)
 Called patient no answer.

## 2024-03-09 NOTE — Telephone Encounter (Signed)
 FYI Only or Action Required?: Action required by provider: patient is asking if cough medication can be sent to her pharmacy.  Patient was last seen in primary care on 12/06/2023 by Bevely Doffing, FNP.  Called Nurse Triage reporting Cough and Shortness of Breath.  Symptoms began several days ago.  Interventions attempted: Rest, hydration, or home remedies.  Symptoms are: unchanged.  Triage Disposition: See Physician Within 24 Hours  Patient/caregiver understands and will follow disposition?: No, wishes to speak with PCP  Copied from CRM #[id number]. Topic: Clinical - Red Word Triage >> Mar 09, 2024  9:52 AM Mia F wrote: Red Word that prompted transfer to Nurse Triage: Pt is asthmatic. She was scheduled to be seen today and cancelled because she is having asthma troubles. She is coughing, SOB, headache, and wheezing. Pt rescheduled her appt from today to next available which is in October. She said she does not feel like coming in today. Reason for Disposition  [1] Continuous (nonstop) coughing interferes with work or school AND [2] no improvement using cough treatment per Care Advice  Answer Assessment - Initial Assessment Questions 1. ONSET: When did the cough begin?      Cough is chronic due to asthma 2. SEVERITY: How bad is the cough today?      Cough is a 5 in nature 3. SPUTUM: Describe the color of your sputum (e.g., none, dry cough; clear, white, yellow, green)     White sputum 4. HEMOPTYSIS: Are you coughing up any blood? If so ask: How much? (e.g., flecks, streaks, tablespoons, etc.)     no 5. DIFFICULTY BREATHING: Are you having difficulty breathing? If Yes, ask: How bad is it? (e.g., mild, moderate, severe)      No shortness of breath 6. FEVER: Do you have a fever? If Yes, ask: What is your temperature, how was it measured, and when did it start?     no 7. CARDIAC HISTORY: Do you have any history of heart disease? (e.g., heart attack, congestive heart  failure)      no 8. LUNG HISTORY: Do you have any history of lung disease?  (e.g., pulmonary embolus, asthma, emphysema)     asthma 9. PE RISK FACTORS: Do you have a history of blood clots? (or: recent major surgery, recent prolonged travel, bedridden)     no 10. OTHER SYMPTOMS: Do you have any other symptoms? (e.g., runny nose, wheezing, chest pain)       Runny nose, wheezing 12. TRAVEL: Have you traveled out of the country in the last month? (e.g., travel history, exposures)       No  Patient given information for care with a cough. Patient given OTC options for cough. Patient is asking if PCP can send a prescription option for a cough to her pharmacy.  Protocols used: Cough - Chronic-A-AH

## 2024-03-10 NOTE — Telephone Encounter (Signed)
Called patient scheduled and added to wait list.

## 2024-03-25 ENCOUNTER — Encounter: Payer: Self-pay | Admitting: Neurology

## 2024-03-25 ENCOUNTER — Ambulatory Visit: Admitting: Neurology

## 2024-04-15 ENCOUNTER — Ambulatory Visit: Admitting: Internal Medicine

## 2024-04-21 ENCOUNTER — Encounter: Payer: Self-pay | Admitting: Family Medicine

## 2024-05-08 ENCOUNTER — Ambulatory Visit

## 2024-05-08 VITALS — BP 130/103 | HR 71 | Ht 61.0 in | Wt 85.0 lb

## 2024-05-08 DIAGNOSIS — Z1231 Encounter for screening mammogram for malignant neoplasm of breast: Secondary | ICD-10-CM

## 2024-05-08 DIAGNOSIS — H04123 Dry eye syndrome of bilateral lacrimal glands: Secondary | ICD-10-CM | POA: Diagnosis not present

## 2024-05-08 DIAGNOSIS — E559 Vitamin D deficiency, unspecified: Secondary | ICD-10-CM | POA: Diagnosis not present

## 2024-05-08 DIAGNOSIS — D509 Iron deficiency anemia, unspecified: Secondary | ICD-10-CM

## 2024-05-08 DIAGNOSIS — E876 Hypokalemia: Secondary | ICD-10-CM

## 2024-05-08 DIAGNOSIS — R7301 Impaired fasting glucose: Secondary | ICD-10-CM | POA: Diagnosis not present

## 2024-05-08 MED ORDER — CYCLOSPORINE 0.05 % OP EMUL: 1.0000 [drp] | Freq: Two times a day (BID) | OPHTHALMIC | 11 refills | Status: AC

## 2024-05-08 NOTE — Progress Notes (Signed)
 Established Patient Office Visit  Subjective   Patient ID: Marissa Frank, female    DOB: 1971/04/01  Age: 53 y.o. MRN: [id number]  Chief Complaint  Patient presents with   Medical Management of Chronic Issues    3 month follow up     HPI Discussed the use of AI scribe software for clinical note transcription with the patient, who gave verbal consent to proceed.  History of Present Illness   Marissa Frank is a 53 year old female who presents with blurry vision in the right eye and abdominal pain.  Visual disturbance - Persistent blurry vision in the right eye - Uses reading glasses but not regular glasses - Occasional burning sensation in the right eye when using eye drops - No eye exam in the past two years; upcoming appointment scheduled  Epigastric pain and gastrointestinal symptoms - Burning sensation in the stomach - Nausea after eating - History of evaluation by a gastroenterologist for hemorrhoids a couple of years ago - Currently taking a medication for stomach symptoms, referred to as 'omidria or something' - No recent gastrointestinal follow-up  Cutaneous mass - Lump increasing in size and starting to itch, located in an area that rubs against her bra - Lump is not sore but is bothersome - Daughter has a similar lump that is asymptomatic - Has tried hydrocortisone  cream and alcohol for relief  Laboratory abnormalities and preventive care - History of previously low potassium levels - Due for laboratory recheck including potassium and iron - No recent mammogram - No pap smear in several years      Patient Active Problem List   Diagnosis Date Noted   Bilateral dry eyes 05/13/2024   Vitamin D  deficiency 05/13/2024   Iron deficiency anemia 05/13/2024   Hypokalemia 05/13/2024   IFG (impaired fasting glucose) 11/13/2023   Vitamin B12 deficiency 11/13/2023   TSH (thyroid-stimulating hormone deficiency) 11/13/2023   Lipid screening 11/13/2023    Transient paralysis 11/13/2023   Need for Streptococcus pneumoniae vaccination 11/13/2023   Iron disorder 06/03/2023   Elevated liver enzymes 06/03/2023   Tobacco use 06/03/2023   Dysuria 05/10/2023   Diffuse pain 05/10/2023   Prolapsed internal hemorrhoids, grade 3 12/21/2021   GERD (gastroesophageal reflux disease) 11/21/2021   Severe episode of recurrent major depressive disorder, with psychotic features (HCC)    Neuropathic pain 09/12/2021   Bloody discharge from left nipple 06/15/2021   Dysphagia 11/16/2020   Peptic ulcer disease 11/16/2020   Constipation 04/18/2018   Rectal bleeding 04/18/2018   Normocytic anemia 04/18/2018   Abdominal pain, chronic, epigastric 04/18/2018      ROS    Objective:     BP (!) 130/103   Pulse 71   Ht 5' 1 (1.549 m)   Wt 85 lb (38.6 kg)   LMP 06/29/2018   SpO2 98%   BMI 16.06 kg/m  BP Readings from Last 3 Encounters:  05/08/24 (!) 130/103  12/06/23 129/79  11/24/23 116/71   Wt Readings from Last 3 Encounters:  05/08/24 85 lb (38.6 kg)  12/06/23 84 lb 1.9 oz (38.2 kg)  11/12/23 85 lb 1.9 oz (38.6 kg)      Physical Exam Vitals and nursing note reviewed.  Constitutional:      Appearance: Normal appearance.  HENT:     Head: Normocephalic.  Eyes:     Extraocular Movements: Extraocular movements intact.     Pupils: Pupils are equal, round, and reactive to light.  Cardiovascular:  Rate and Rhythm: Normal rate and regular rhythm.  Pulmonary:     Effort: Pulmonary effort is normal.     Breath sounds: Normal breath sounds.  Musculoskeletal:     Cervical back: Normal range of motion and neck supple.  Neurological:     Mental Status: She is alert and oriented to person, place, and time.  Psychiatric:        Mood and Affect: Mood normal.        Thought Content: Thought content normal.        The ASCVD Risk score (Arnett DK, et al., 2019) failed to calculate for the following reasons:   The valid HDL cholesterol range  is 20 to 100 mg/dL    Assessment & Plan:   Problem List Items Addressed This Visit       Endocrine   IFG (impaired fasting glucose)   Check labs today for reevaluation on condition.        Relevant Orders   Basic Metabolic Panel (BMET) (Completed)   HgB A1c (Completed)     Other   Bilateral dry eyes - Primary    Blurry vision and burning in right eye with dry eye syndrome Chronic blurry vision and burning in the right eye, consistent with dry eye syndrome. Symptoms exacerbated by eye drops. Upcoming eye exam scheduled. - Prescribe Restasis  for dry eye syndrome. - Advise follow-up with eye doctor for further evaluation.      Relevant Medications   cycloSPORINE  (RESTASIS ) 0.05 % ophthalmic emulsion   Vitamin D  deficiency   Iron deficiency anemia   Iron deficiency requiring monitoring. - Order lab tests to recheck iron levels.      Relevant Orders   CBC with Differential/Platelet (Completed)   Fe+TIBC+Fer (Completed)   Hypokalemia   Previously noted low potassium levels. - Order lab tests to recheck potassium levels.      Relevant Orders   Basic Metabolic Panel (BMET) (Completed)   Other Visit Diagnoses       Encounter for screening mammogram for malignant neoplasm of breast       - Schedule mammogram.   Relevant Orders   MM Digital Screening       Return in about 6 months (around 11/06/2024) for chronic follow-up with PCP.    Leita Longs, FNP

## 2024-05-09 LAB — BASIC METABOLIC PANEL WITH GFR
BUN/Creatinine Ratio: 11 (ref 9–23)
BUN: 7 mg/dL (ref 6–24)
CO2: 24 mmol/L (ref 20–29)
Calcium: 9.9 mg/dL (ref 8.7–10.2)
Chloride: 100 mmol/L (ref 96–106)
Creatinine, Ser: 0.62 mg/dL (ref 0.57–1.00)
Glucose: 102 mg/dL — ABNORMAL HIGH (ref 70–99)
Potassium: 4.4 mmol/L (ref 3.5–5.2)
Sodium: 137 mmol/L (ref 134–144)
eGFR: 105 mL/min/1.73 (ref >59)

## 2024-05-09 LAB — CBC WITH DIFFERENTIAL/PLATELET
Basophils Absolute: 0 x10E3/uL (ref 0.0–0.2)
Basos: 1 %
EOS (ABSOLUTE): 0 x10E3/uL (ref 0.0–0.4)
Eos: 0 %
Hematocrit: 37.6 % (ref 34.0–46.6)
Hemoglobin: 12.7 g/dL (ref 11.1–15.9)
Immature Grans (Abs): 0 x10E3/uL (ref 0.0–0.1)
Immature Granulocytes: 0 %
Lymphocytes Absolute: 1.1 x10E3/uL (ref 0.7–3.1)
Lymphs: 25 %
MCH: 35.7 pg — ABNORMAL HIGH (ref 26.6–33.0)
MCHC: 33.8 g/dL (ref 31.5–35.7)
MCV: 106 fL — ABNORMAL HIGH (ref 79–97)
Monocytes Absolute: 0.7 x10E3/uL (ref 0.1–0.9)
Monocytes: 16 %
Neutrophils Absolute: 2.5 x10E3/uL (ref 1.4–7.0)
Neutrophils: 58 %
Platelets: 306 x10E3/uL (ref 150–450)
RBC: 3.56 x10E6/uL — ABNORMAL LOW (ref 3.77–5.28)
RDW: 12.7 % (ref 11.7–15.4)
WBC: 4.5 x10E3/uL (ref 3.4–10.8)

## 2024-05-09 LAB — HEMOGLOBIN A1C
Est. average glucose Bld gHb Est-mCnc: 94 mg/dL
Hgb A1c MFr Bld: 4.9 % (ref 4.8–5.6)

## 2024-05-09 LAB — IRON,TIBC AND FERRITIN PANEL
Ferritin: 149 ng/mL (ref 15–150)
Iron Saturation: 68 % — ABNORMAL HIGH (ref 15–55)
Iron: 173 ug/dL — ABNORMAL HIGH (ref 27–159)
Total Iron Binding Capacity: 254 ug/dL (ref 250–450)
UIBC: 81 ug/dL — ABNORMAL LOW (ref 131–425)

## 2024-05-13 ENCOUNTER — Ambulatory Visit: Payer: Self-pay

## 2024-05-13 DIAGNOSIS — E876 Hypokalemia: Secondary | ICD-10-CM | POA: Insufficient documentation

## 2024-05-13 DIAGNOSIS — E559 Vitamin D deficiency, unspecified: Secondary | ICD-10-CM | POA: Insufficient documentation

## 2024-05-13 DIAGNOSIS — H04123 Dry eye syndrome of bilateral lacrimal glands: Secondary | ICD-10-CM | POA: Insufficient documentation

## 2024-05-13 DIAGNOSIS — D509 Iron deficiency anemia, unspecified: Secondary | ICD-10-CM | POA: Insufficient documentation

## 2024-05-13 NOTE — Assessment & Plan Note (Signed)
 Blurry vision and burning in right eye with dry eye syndrome Chronic blurry vision and burning in the right eye, consistent with dry eye syndrome. Symptoms exacerbated by eye drops. Upcoming eye exam scheduled. - Prescribe Restasis  for dry eye syndrome. - Advise follow-up with eye doctor for further evaluation.

## 2024-05-13 NOTE — Assessment & Plan Note (Signed)
 Iron deficiency requiring monitoring. - Order lab tests to recheck iron levels.

## 2024-05-13 NOTE — Assessment & Plan Note (Signed)
 Previously noted low potassium levels. - Order lab tests to recheck potassium levels.

## 2024-05-13 NOTE — Assessment & Plan Note (Signed)
 Check labs today for reevaluation on condition.

## 2024-05-14 ENCOUNTER — Encounter: Payer: Self-pay | Admitting: Emergency Medicine

## 2024-05-14 ENCOUNTER — Telehealth: Payer: Self-pay

## 2024-05-14 ENCOUNTER — Ambulatory Visit: Payer: Self-pay

## 2024-05-14 ENCOUNTER — Ambulatory Visit: Admission: EM | Admit: 2024-05-14 | Discharge: 2024-05-14 | Disposition: A | Discharge status: Home / Self Care

## 2024-05-14 DIAGNOSIS — R1033 Periumbilical pain: Secondary | ICD-10-CM

## 2024-05-14 NOTE — Discharge Instructions (Signed)
Please go to the emergency department for further evaluation.

## 2024-05-14 NOTE — Telephone Encounter (Signed)
 Called pt to let her know that Her labs show that her iron levels continue to be elevated, so recommend stopping iron supplement if she has not already.  All other labs are stable.  Recommend follow-up in 6 months or sooner if needed.

## 2024-05-14 NOTE — Telephone Encounter (Signed)
 FYI Only or Action Required?: FYI only for provider.  Patient was last seen in primary care on 05/08/2024 by Bevely Doffing, FNP.  Called Nurse Triage reporting Abdominal Pain.  Symptoms began yesterday.  Interventions attempted: Nothing.  Symptoms are: unchanged.  Triage Disposition: See HCP Within 4 Hours (Or PCP Triage)  Patient/caregiver understands and will follow disposition?: Yes      Copied from CRM #[id number]. Topic: Clinical - Red Word Triage >> May 14, 2024  2:33 PM Marissa Frank wrote: Red Word that prompted transfer to Nurse Triage: Patient is calling to report that her stomach is swollen and sore to the touch that started yesterday. Reason for Disposition  [1] MILD-MODERATE pain AND [2] constant AND [3] age > 60 years  Answer Assessment - Initial Assessment Questions 1. LOCATION: Where does it hurt?  Entire Abd swollen, bloated, gassy  - pain near navel very soreness 3/10 2. RADIATION: Does the pain shoot anywhere else? (e.g., chest, back)     on 3. ONSET: When did the pain begin? (e.g., minutes, hours or days ago)      Yesterday and worse today 4. SUDDEN: Gradual or sudden onset?     gradual 5. PATTERN Does the pain come and go, or is it constant?     constant 6. SEVERITY: How bad is the pain?  (e.g., Scale 1-10; mild, moderate, or severe)     3/10 7. RECURRENT SYMPTOM: Have you ever had this type of stomach pain before? If Yes, ask: When was the last time? and What happened that time?      Yes - due constipation 8. CAUSE: What do you think is causing the stomach pain? (e.g., gallstones, recent abdominal surgery)     Unknown - pt stating  9. RELIEVING/AGGRAVATING FACTORS: What makes it better or worse? (e.g., antacids, bending or twisting motion, bowel movement)     Sitting down pain increase 10. OTHER SYMPTOMS: Do you have any other symptoms? (e.g., back pain, diarrhea, fever, urination pain, vomiting)       no 11. PREGNANCY: Is there  any chance you are pregnant? When was your last menstrual period?       na  Last BM this morning - real gassy- with 2 hard small stools.  Reflux feeling in chest  like needing to burp.  Nurse informed pt to been seen by a PCP now:  no appts to offer that work with pt schedule:  pt stated she will go to [facility] urgent care in [place].  Protocols used: Abdominal Pain - Female-A-AH

## 2024-05-14 NOTE — ED Provider Notes (Addendum)
 [facility]    CSN: [id number] Arrival date & time: 05/14/24  1901      History   Chief Complaint No chief complaint on file.   HPI Marissa Frank is a 53 y.o. female.   The history is provided by the patient.   Patient presents for complaints of umbilical abdominal pain that started over the past 24 hours.  Patient states that the area around her umbilicus is tender to gentle touch.  She also states that the pain worsens when she leans forward.  She rates her pain 5/10 at present. Patient also endorses bloating.  Patient states my stomach ache never been this big.  Patient reports that she does have underlying history of irritable bowel.  She states her symptoms do not feel like this.  She states her last bowel movement was 2 days ago.    Patient states she has been taking over-the-counter Tylenol  and ibuprofen  for pain.  Patient denies fever, chills, nausea, vomiting, bloody stools, or diarrhea.  Past Medical History:  Diagnosis Date   Allergy    Asthma    Cervical cancer (HCC)    Depression    Hemorrhoids     Patient Active Problem List   Diagnosis Date Noted   Bilateral dry eyes 05/13/2024   Vitamin D  deficiency 05/13/2024   Iron deficiency anemia 05/13/2024   Hypokalemia 05/13/2024   IFG (impaired fasting glucose) 11/13/2023   Vitamin B12 deficiency 11/13/2023   TSH (thyroid-stimulating hormone deficiency) 11/13/2023   Lipid screening 11/13/2023   Transient paralysis 11/13/2023   Need for Streptococcus pneumoniae vaccination 11/13/2023   Iron disorder 06/03/2023   Elevated liver enzymes 06/03/2023   Tobacco use 06/03/2023   Dysuria 05/10/2023   Diffuse pain 05/10/2023   Prolapsed internal hemorrhoids, grade 3 12/21/2021   GERD (gastroesophageal reflux disease) 11/21/2021   Severe episode of recurrent major depressive disorder, with psychotic features (HCC)    Neuropathic pain 09/12/2021   Bloody discharge from left nipple 06/15/2021    Dysphagia 11/16/2020   Peptic ulcer disease 11/16/2020   Constipation 04/18/2018   Rectal bleeding 04/18/2018   Normocytic anemia 04/18/2018   Abdominal pain, chronic, epigastric 04/18/2018    Past Surgical History:  Procedure Laterality Date   BIOPSY  04/24/2018   Procedure: BIOPSY;  Surgeon: Shaaron Lamar HERO, MD;  Location: [facility];  Service: Endoscopy;;   BIOPSY  01/20/2021   Procedure: BIOPSY;  Surgeon: Shaaron Lamar HERO, MD;  Location: [facility];  Service: Endoscopy;;  gastric   BREAST BIOPSY Left 08/25/2021   Procedure: BREAST BIOPSY WITH DUCT EXCISIONS;  Surgeon: Kallie Manuelita BROCKS, MD;  Location: [facility];  Service: General;  Laterality: Left;   BREAST SURGERY     benign tumor   CERVICAL CONE BIOPSY     COLONOSCOPY WITH PROPOFOL  N/A 01/20/2021   hemorrhoids, one 5 mm polyp in sigmoid colon, simgoid and descending colon diverticulosis. leiomyoma   ESOPHAGOGASTRODUODENOSCOPY (EGD) WITH PROPOFOL  N/A 04/24/2018   reflux esophagitis s/p dilation, small hiatal hernia, multiple small gastric ulcers s/p biopsy, reactive gastropathy, no erosions. +NSAIDs at that time.   ESOPHAGOGASTRODUODENOSCOPY (EGD) WITH PROPOFOL  N/A 01/20/2021   normal esophagus s/p dilation, small hiatal hernia, antral erosions s/p biopsy. Normal duodenum.   MALONEY DILATION N/A 04/24/2018   Procedure: AGAPITO DILATION;  Surgeon: Shaaron Lamar HERO, MD;  Location: [facility];  Service: Endoscopy;  Laterality: N/A;   MALONEY DILATION N/A 01/20/2021   Procedure: AGAPITO DILATION;  Surgeon: Shaaron Lamar HERO,  MD;  Location: [facility];  Service: Endoscopy;  Laterality: N/A;   POLYPECTOMY  01/20/2021   Procedure: POLYPECTOMY;  Surgeon: Shaaron Lamar HERO, MD;  Location: [facility];  Service: Endoscopy;;   TUBAL LIGATION     WISDOM TOOTH EXTRACTION      OB History     Gravida  3   Para  2   Term  2   Preterm      AB  1   Living         SAB      IAB  1   Ectopic      Multiple      Live  Births               Home Medications    Prior to Admission medications   Medication Sig Start Date End Date Taking? Authorizing Provider  acetaminophen  (TYLENOL ) 500 MG tablet Take 500 mg by mouth every 6 (six) hours as needed.    [provider]  albuterol  (PROVENTIL ) (2.5 MG/3ML) 0.083% nebulizer solution Take 3 mLs (2.5 mg total) by nebulization every 6 (six) hours as needed for wheezing or shortness of breath. 08/23/22   Chandra Harlene LABOR, NP  albuterol  (VENTOLIN  HFA) 108 (90 Base) MCG/ACT inhaler Inhale 1-2 puffs into the lungs every 6 (six) hours as needed for wheezing or shortness of breath. 12/06/23   Bevely Doffing, FNP  benzonatate  (TESSALON ) 100 MG capsule Take 1 capsule (100 mg total) by mouth 3 (three) times daily as needed for cough. Do not take with alcohol or while driving or operating heavy machinery.  May cause drowsiness. 08/23/22   Chandra Harlene LABOR, NP  Cholecalciferol (VITAMIN D3) 50 MCG (2000 UT) capsule Take 1 capsule (2,000 Units total) by mouth daily. 11/20/23   Del Orbe Polanco, Iliana, FNP  cycloSPORINE  (RESTASIS ) 0.05 % ophthalmic emulsion Place 1 drop into both eyes 2 (two) times daily. 05/08/24   Bevely Doffing, FNP  DULoxetine  (CYMBALTA ) 30 MG capsule Take 1 capsule (30 mg total) by mouth daily. 11/20/23   Del Wilhelmena Lloyd Sola, FNP  fluticasone  (FLONASE ) 50 MCG/ACT nasal spray Place 1 spray into both nostrils daily. 08/23/22   Chandra Harlene LABOR, NP  folic acid  (FOLVITE ) 1 MG tablet Take 1 tablet (1 mg total) by mouth daily. 06/05/23   Kandala, Hyndavi, MD  gabapentin  (NEURONTIN ) 300 MG capsule Take 1 capsule (300 mg total) by mouth 3 (three) times daily. 11/20/23   Del Orbe Polanco, Iliana, FNP  HYDROcodone -acetaminophen  (NORCO/VICODIN) 5-325 MG tablet Take 1 tablet by mouth every 6 (six) hours as needed for severe pain (pain score 7-10). 11/24/23   Roselyn Carlin NOVAK, MD  linaclotide  (LINZESS ) 290 MCG CAPS capsule Take 1 capsule (290 mcg total) by  mouth daily before breakfast. 11/20/23   Del Wilhelmena Lloyd Sola, FNP  Naphazoline-Polyethyl Glycol (EYE DROPS) 0.012-0.2 % SOLN One drop each eye as needed 11/20/23   Del Orbe Polanco, Iliana, FNP  omeprazole  (PRILOSEC) 40 MG capsule Take 1 capsule (40 mg total) by mouth daily. 30 minutes before breakfast 11/20/23   Del Wilhelmena Lloyd, Clayton, FNP  ondansetron  (ZOFRAN -ODT) 4 MG disintegrating tablet Take 1 tablet (4 mg total) by mouth every 8 (eight) hours as needed for nausea or vomiting. 11/24/23   Roselyn Carlin NOVAK, MD  solifenacin  (VESICARE ) 5 MG tablet Take 1 tablet (5 mg total) by mouth daily. 11/20/23   Del Orbe Polanco, Iliana, FNP  Vitamin D , Ergocalciferol , (DRISDOL ) 1.25 MG (50000 UNIT) CAPS  capsule Take 1 capsule (50,000 Units total) by mouth every 7 (seven) days. 12/01/23   Bevely Doffing, FNP    Family History Family History  Problem Relation Age of Onset   Hypertension Mother    Stroke Mother    Colon cancer Neg Hx    Colon polyps Neg Hx    Stomach cancer Neg Hx    Liver disease Neg Hx     Social History Social History   Tobacco Use   Smoking status: Every Day    Current packs/day: 0.25    Types: Cigarettes   Smokeless tobacco: Never  Vaping Use   Vaping status: Former  Substance Use Topics   Alcohol use: Yes    Alcohol/week: 7.0 standard drinks of alcohol    Types: 7 Glasses of wine per week    Comment: 05/23/21-glass of wine nightly   Drug use: Not Currently    Types: Marijuana    Comment: none since teenager     Allergies   Almond oil, Carrot [daucus carota], Celery oil, Coconut flavoring agent (non-screening), and Macadamia nut oil   Review of Systems Review of Systems Per HPI  Physical Exam Triage Vital Signs ED Triage Vitals  Encounter Vitals Group     BP 05/14/24 1909 111/66     Girls Systolic BP Percentile --      Girls Diastolic BP Percentile --      Boys Systolic BP Percentile --      Boys Diastolic BP Percentile --      Pulse Rate 05/14/24  1909 63     Resp 05/14/24 1909 18     Temp 05/14/24 1909 98.4 F (36.9 C)     Temp Source 05/14/24 1909 Oral     SpO2 05/14/24 1909 96 %     Weight --      Height --      Head Circumference --      Peak Flow --      Pain Score 05/14/24 1910 5     Pain Loc --      Pain Education --      Exclude from Growth Chart --    No data found.  Updated Vital Signs BP 111/66 (BP Location: Right Arm)   Pulse 63   Temp 98.4 F (36.9 C) (Oral)   Resp 18   LMP 06/29/2018   SpO2 96%   Visual Acuity Right Eye Distance:   Left Eye Distance:   Bilateral Distance:    Right Eye Near:   Left Eye Near:    Bilateral Near:     Physical Exam Vitals and nursing note reviewed.  Constitutional:      General: She is in acute distress (Appears uncomfortable due to abdominal pain).     Appearance: Normal appearance.  HENT:     Head: Normocephalic.  Eyes:     Extraocular Movements: Extraocular movements intact.     Pupils: Pupils are equal, round, and reactive to light.  Cardiovascular:     Rate and Rhythm: Normal rate and regular rhythm.     Pulses: Normal pulses.     Heart sounds: Normal heart sounds.  Pulmonary:     Effort: Pulmonary effort is normal. No respiratory distress.     Breath sounds: Normal breath sounds. No stridor. No wheezing, rhonchi or rales.  Abdominal:     General: Abdomen is protuberant. Bowel sounds are normal. There is distension.     Palpations: Abdomen is soft.     Tenderness:  There is abdominal tenderness in the periumbilical area.  Musculoskeletal:     Cervical back: Normal range of motion.  Skin:    General: Skin is warm and dry.  Neurological:     General: No focal deficit present.     Mental Status: She is alert and oriented to person, place, and time.  Psychiatric:        Mood and Affect: Mood normal.        Behavior: Behavior normal.      UC Treatments / Results  Labs (all labs ordered are listed, but only abnormal results are displayed) Labs  Reviewed - No data to display  EKG   Radiology No results found.  Procedures Procedures (including critical care time)  Medications Ordered in UC Medications - No data to display  Initial Impression / Assessment and Plan / UC Course  I have reviewed the triage vital signs and the nursing notes.  Pertinent labs & imaging results that were available during my care of the patient were reviewed by me and considered in my medical decision making (see chart for details).  On initial exam, patient appears uncomfortable due to her abdominal pain.  She complains of increased abdominal pain with gentle touch, patient unable to tolerate deep palpation.  Patient also with noticed distention.  Patient with underlying history of IBS.  Unable to perform any imaging as this office does not have x-ray available at this time.  Given the patient's degree of discomfort, underlying history, and current presentation, patient was advised that it is recommended that she be seen in the emergency department for further evaluation.  Patient was in agreement with this plan of care and verbalizes understanding.  Patient's vital signs are stable, she is able to travel via private vehicle.  Patient discharged to the emergency department.   Final Clinical Impressions(s) / UC Diagnoses   Final diagnoses:  None   Discharge Instructions   None    ED Prescriptions   None    PDMP not reviewed this encounter.   Gilmer Etta PARAS, NP 05/14/24 1956    Gilmer Etta PARAS, NP 05/14/24 1956

## 2024-05-14 NOTE — ED Triage Notes (Signed)
 Pain around bell button since yesterday.  States her stomach is swollen and it hurts to touch that area on her abdomen.  Last BM was 2 days ago.

## 2024-05-14 NOTE — ED Notes (Signed)
 Patient is being discharged from the Urgent Care and sent to the Emergency Department via private vehicle . Per NP, patient is in need of higher level of care due to ABD pain that is tender to the touch. Patient is aware and verbalizes understanding of plan of care.  Vitals:   05/14/24 1909  BP: 111/66  Pulse: 63  Resp: 18  Temp: 98.4 F (36.9 C)  SpO2: 96%  '

## 2024-07-16 ENCOUNTER — Ambulatory Visit

## 2024-07-16 ENCOUNTER — Telehealth: Payer: Self-pay | Admitting: Family Medicine

## 2024-07-16 VITALS — Ht 61.0 in | Wt 88.0 lb

## 2024-07-16 DIAGNOSIS — Z0000 Encounter for general adult medical examination without abnormal findings: Secondary | ICD-10-CM

## 2024-07-16 DIAGNOSIS — Z1231 Encounter for screening mammogram for malignant neoplasm of breast: Secondary | ICD-10-CM

## 2024-07-16 NOTE — Progress Notes (Signed)
 HM Addressed: Mammogram ordered Patient advised to call eye doctor and schedule yearly eye exam  Chief Complaint  Patient presents with   Medicare Wellness     Subjective:   Marissa Frank is a 53 y.o. female who presents for a Medicare Annual Wellness Visit.  Visit info / Clinical Intake: Medicare Wellness Visit Type:: Initial Annual Wellness Visit Persons participating in visit and providing information:: patient Medicare Wellness Visit Mode:: Video Since this visit was completed virtually, some vitals may be partially provided or unavailable. Missing vitals are due to the limitations of the virtual format.: Documented vitals are patient reported If Telephone or Video please confirm:: I connected with patient using audio/video enable telemedicine. I verified patient identity with two identifiers, discussed telehealth limitations, and patient agreed to proceed. Patient Location:: home Provider Location:: home office Interpreter Needed?: No Pre-visit prep was completed: yes AWV questionnaire completed by patient prior to visit?: no Living arrangements:: lives with spouse/significant other Patient's Overall Health Status Rating: good Typical amount of pain: (!) a lot Does pain affect daily life?: (!) yes Are you currently prescribed opioids?: no  Dietary Habits and Nutritional Risks How many meals a day?: 3 Eats fruit and vegetables daily?: yes Most meals are obtained by: preparing own meals; having others provide food In the last 2 weeks, have you had any of the following?: none Diabetic:: no  Functional Status Ambulation: Independent Medication Administration: Independent Home Management (perform basic housework or laundry): Independent Manage your own finances?: yes Primary transportation is: driving Concerns about vision?: no *vision screening is required for WTM* Concerns about hearing?: no  Fall Screening Falls in the past year?: 0 Number of falls in past  year: 0 Was there an injury with Fall?: 0 Fall Risk Category Calculator: 0 Patient Fall Risk Level: Low Fall Risk  Fall Risk Patient at Risk for Falls Due to: No Fall Risks Fall risk Follow up: Falls evaluation completed; Education provided; Falls prevention discussed  Home and Transportation Safety: All rugs have non-skid backing?: yes All stairs or steps have railings?: yes Grab bars in the bathtub or shower?: (!) no Have non-skid surface in bathtub or shower?: yes Good home lighting?: yes Regular seat belt use?: yes Hospital stays in the last year:: no  Cognitive Assessment Difficulty concentrating, remembering, or making decisions? : no Will 6CIT or Mini Cog be Completed: yes What year is it?: 0 points What month is it?: 0 points Give patient an address phrase to remember (5 components): [address] [place] About what time is it?: 0 points Count backwards from 20 to 1: 0 points Say the months of the year in reverse: 0 points Repeat the address phrase from earlier: 0 points 6 CIT Score: 0 points  Advance Directives (For Healthcare) Does Patient Have a Medical Advance Directive?: No Would patient like information on creating a medical advance directive?: No - Patient declined  Reviewed/Updated  Reviewed/Updated: Reviewed All (Medical, Surgical, Family, Medications, Allergies, Care Teams, Patient Goals)    Allergies (verified) Almond oil, Carrot [daucus carota], Celery oil, Coconut flavoring agent (non-screening), and Macadamia nut oil   Current Medications (verified) Outpatient Encounter Medications as of 07/16/2024  Medication Sig   acetaminophen  (TYLENOL ) 500 MG tablet Take 500 mg by mouth every 6 (six) hours as needed.   albuterol  (PROVENTIL ) (2.5 MG/3ML) 0.083% nebulizer solution Take 3 mLs (2.5 mg total) by nebulization every 6 (six) hours as needed for wheezing or shortness of breath.   albuterol  (VENTOLIN  HFA) 108 (  90 Base) MCG/ACT inhaler Inhale 1-2 puffs  into the lungs every 6 (six) hours as needed for wheezing or shortness of breath.   benzonatate  (TESSALON ) 100 MG capsule Take 1 capsule (100 mg total) by mouth 3 (three) times daily as needed for cough. Do not take with alcohol or while driving or operating heavy machinery.  May cause drowsiness.   Cholecalciferol (VITAMIN D3) 50 MCG (2000 UT) capsule Take 1 capsule (2,000 Units total) by mouth daily.   cycloSPORINE  (RESTASIS ) 0.05 % ophthalmic emulsion Place 1 drop into both eyes 2 (two) times daily.   DULoxetine  (CYMBALTA ) 30 MG capsule Take 1 capsule (30 mg total) by mouth daily.   fluticasone  (FLONASE ) 50 MCG/ACT nasal spray Place 1 spray into both nostrils daily.   folic acid  (FOLVITE ) 1 MG tablet Take 1 tablet (1 mg total) by mouth daily.   gabapentin  (NEURONTIN ) 300 MG capsule Take 1 capsule (300 mg total) by mouth 3 (three) times daily.   HYDROcodone -acetaminophen  (NORCO/VICODIN) 5-325 MG tablet Take 1 tablet by mouth every 6 (six) hours as needed for severe pain (pain score 7-10).   linaclotide  (LINZESS ) 290 MCG CAPS capsule Take 1 capsule (290 mcg total) by mouth daily before breakfast.   Naphazoline-Polyethyl Glycol (EYE DROPS) 0.012-0.2 % SOLN One drop each eye as needed   omeprazole  (PRILOSEC) 40 MG capsule Take 1 capsule (40 mg total) by mouth daily. 30 minutes before breakfast   ondansetron  (ZOFRAN -ODT) 4 MG disintegrating tablet Take 1 tablet (4 mg total) by mouth every 8 (eight) hours as needed for nausea or vomiting.   solifenacin  (VESICARE ) 5 MG tablet Take 1 tablet (5 mg total) by mouth daily.   Vitamin D , Ergocalciferol , (DRISDOL ) 1.25 MG (50000 UNIT) CAPS capsule Take 1 capsule (50,000 Units total) by mouth every 7 (seven) days.   No facility-administered encounter medications on file as of 07/16/2024.    History: Past Medical History:  Diagnosis Date   Allergy    Asthma    Cervical cancer (HCC)    Depression    Hemorrhoids    Past Surgical History:  Procedure  Laterality Date   BIOPSY  04/24/2018   Procedure: BIOPSY;  Surgeon: Shaaron Lamar HERO, MD;  [facility];  Service: Endoscopy;;   BIOPSY  01/20/2021   Procedure: BIOPSY;  Surgeon: Shaaron Lamar HERO, MD;  [facility]: [facility];  Service: Endoscopy;;  gastric   BREAST BIOPSY Left 08/25/2021   Procedure: BREAST BIOPSY WITH DUCT EXCISIONS;  Surgeon: Kallie Manuelita BROCKS, MD;  Location: [facility];  Service: General;  Laterality: Left;   BREAST SURGERY     benign tumor   CERVICAL CONE BIOPSY     COLONOSCOPY WITH PROPOFOL  N/A 01/20/2021   hemorrhoids, one 5 mm polyp in sigmoid colon, simgoid and descending colon diverticulosis. leiomyoma   ESOPHAGOGASTRODUODENOSCOPY (EGD) WITH PROPOFOL  N/A 04/24/2018   reflux esophagitis s/p dilation, small hiatal hernia, multiple small gastric ulcers s/p biopsy, reactive gastropathy, no erosions. +NSAIDs at that time.   ESOPHAGOGASTRODUODENOSCOPY (EGD) WITH PROPOFOL  N/A 01/20/2021   normal esophagus s/p dilation, small hiatal hernia, antral erosions s/p biopsy. Normal duodenum.   MALONEY DILATION N/A 04/24/2018   Procedure: AGAPITO DILATION;  Surgeon: Shaaron Lamar HERO, MD;  [facility];  Service: Endoscopy;  Laterality: N/A;   MALONEY DILATION N/A 01/20/2021   Procedure: AGAPITO DILATION;  Surgeon: Shaaron Lamar HERO, MD;  [facility];  Service: Endoscopy;  Laterality: N/A;   POLYPECTOMY  01/20/2021   Procedure: POLYPECTOMY;  Surgeon: Shaaron Lamar HERO, MD;  [facility];  Service: Endoscopy;;   TUBAL LIGATION     WISDOM TOOTH EXTRACTION     Family History  Problem Relation Age of Onset   Hypertension Mother    Stroke Mother    Colon cancer Neg Hx    Colon polyps Neg Hx    Stomach cancer Neg Hx    Liver disease Neg Hx    Social History   Occupational History   Not on file  Tobacco Use   Smoking status: Every Day    Current packs/day: 0.25    Average packs/day: 0.3 packs/day for 34.0 years (8.5 ttl pk-yrs)    Types:  Cigarettes    Start date: 1992   Smokeless tobacco: Never  Vaping Use   Vaping status: Former  Substance and Sexual Activity   Alcohol use: Yes    Alcohol/week: 7.0 standard drinks of alcohol    Types: 7 Glasses of wine per week    Comment: 05/23/21-glass of wine nightly   Drug use: Not Currently    Types: Marijuana    Comment: none since teenager   Sexual activity: Yes    Birth control/protection: Surgical, Post-menopausal   Tobacco Counseling Ready to quit: No Counseling given: Yes  SDOH Screenings   Food Insecurity: No Food Insecurity (07/16/2024)  Housing: Low Risk (07/16/2024)  Transportation Needs: No Transportation Needs (07/16/2024)  Utilities: Not At Risk (07/16/2024)  Depression (PHQ2-9): Low Risk (07/16/2024)  Recent Concern: Depression (PHQ2-9) - High Risk (05/08/2024)  Physical Activity: Patient Declined (07/16/2024)  Social Connections: Moderately Isolated (07/16/2024)  Stress: No Stress Concern Present (07/16/2024)  Tobacco Use: High Risk (07/16/2024)  Health Literacy: Adequate Health Literacy (07/16/2024)   See flowsheets for full screening details  Depression Screen PHQ 2 & 9 Depression Scale- Over the past 2 weeks, how often have you been bothered by any of the following problems? Little interest or pleasure in doing things: 0 Feeling down, depressed, or hopeless (PHQ Adolescent also includes...irritable): 0 PHQ-2 Total Score: 0 Trouble falling or staying asleep, or sleeping too much: 0 Feeling tired or having little energy: 0 Poor appetite or overeating (PHQ Adolescent also includes...weight loss): 0 Feeling bad about yourself - or that you are a failure or have let yourself or your family down: 0 Trouble concentrating on things, such as reading the newspaper or watching television (PHQ Adolescent also includes...like school work): 0 Moving or speaking so slowly that other people could have noticed. Or the opposite - being so fidgety or restless that  you have been moving around a lot more than usual: 0 Thoughts that you would be better off dead, or of hurting yourself in some way: 0 PHQ-9 Total Score: 0 If you checked off any problems, how difficult have these problems made it for you to do your work, take care of things at home, or get along with other people?: Not difficult at all  Depression Treatment Depression Interventions/Treatment : EYV7-0 Score <4 Follow-up Not Indicated     Goals Addressed               This Visit's Progress     I want to remain healthy (pt-stated)               Objective:    Today's Vitals   07/16/24 1558  Weight: 88 lb (39.9 kg)  Height: 5' 1 (1.549 m)   Body mass index is 16.63 kg/m.  Hearing/Vision screen Hearing Screening - Comments::  Patient denies any hearing difficulties.   Vision Screening - Comments:: Patient is not up to date on yearly eye exams.  She will call Dr. Oneil Kawasaki to schedule that yearly exam.  Immunizations and Health Maintenance Health Maintenance  Topic Date Due   Medicare Annual Wellness (AWV)  Never done   Hepatitis B Vaccines 19-59 Average Risk (1 of 3 - 19+ 3-dose series) Never done   Zoster Vaccines- Shingrix (1 of 2) Never done   Mammogram  12/01/2022   Influenza Vaccine  02/28/2024   COVID-19 Vaccine (2 - 2025-26 season) 03/30/2024   Cervical Cancer Screening (HPV/Pap Cotest)  06/02/2026   DTaP/Tdap/Td (2 - Td or Tdap) 02/28/2027   Colonoscopy  01/21/2031   Pneumococcal Vaccine: 50+ Years  Completed   Hepatitis C Screening  Completed   HIV Screening  Completed   HPV VACCINES  Aged Out   Meningococcal B Vaccine  Aged Out        Assessment/Plan:  This is a routine wellness examination for Marissa Frank.  Patient Care Team: Del Wilhelmena Falter, Hilario, FNP as PCP - General (Family Medicine) Shaaron, Lamar HERO, MD as Consulting Physician (Gastroenterology) Debera Jayson MATSU, MD as Consulting Physician (Cardiology) Kawasaki Oneil, DO  (Optometry) Shirlean Therisa ORN, NP as Chaplain (Gastroenterology)  I have personally reviewed and noted the following in the patients chart:   Medical and social history Use of alcohol, tobacco or illicit drugs  Current medications and supplements including opioid prescriptions. Functional ability and status Nutritional status Physical activity Advanced directives List of other physicians Hospitalizations, surgeries, and ER visits in previous 12 months Vitals Screenings to include cognitive, depression, and falls Referrals and appointments  Orders Placed This Encounter  Procedures   MM 3D SCREENING MAMMOGRAM BILATERAL BREAST    NAS No implants NMD-[facility]    Standing Status:   Future    Expiration Date:   07/16/2025    Reason for Exam (SYMPTOM  OR DIAGNOSIS REQUIRED):   breast cancer screening    Preferred imaging location?:   [facility]    Is the patient pregnant?:   No   In addition, I have reviewed and discussed with patient certain preventive protocols, quality metrics, and best practice recommendations. A written personalized care plan for preventive services as well as general preventive health recommendations were provided to patient.   Carry Weesner, CMA   07/16/2024   Return on Monday July 19, 2025 at 3:50pm, for your yearly Medicare Wellness Visit in person.  After Visit Summary: (MyChart) Due to this being a telephonic visit, the after visit summary with patients personalized plan was offered to patient via MyChart

## 2024-07-16 NOTE — Patient Instructions (Signed)
 [name],  Thank you for taking the time for your Medicare Wellness Visit. I appreciate your continued commitment to your health goals. Please review the care plan we discussed, and feel free to reach out if I can assist you further.  Please note that Annual Wellness Visits do not include a physical exam. Some assessments may be limited, especially if the visit was conducted virtually. If needed, we may recommend an in-person follow-up with your provider.  Ongoing Care Seeing your primary care provider every 3 to 6 months helps us  monitor your health and provide consistent, personalized care.   1 year follow up for Medicare well visit: Monday July 19, 2025 at 3:50 pm with medicare wellness nurse in office  Referrals If a referral was made during today's visit and you haven't received any updates within two weeks, please contact the referred provider directly to check on the status.  Mammogram at [facility] Call [phone] to schedule your screening No perfumes, lotions, or deodorants the day of your screening. You can schedule your mammogram through mychart!   Call [facility] in [place] and schedule your yearly eye exam  Recommended Screenings:  Health Maintenance  Topic Date Due   Medicare Annual Wellness Visit  Never done   Hepatitis B Vaccine (1 of 3 - 19+ 3-dose series) Never done   Zoster (Shingles) Vaccine (1 of 2) Never done   Breast Cancer Screening  12/01/2022   Flu Shot  02/28/2024   COVID-19 Vaccine (2 - 2025-26 season) 03/30/2024   Pap with HPV screening  06/02/2026   DTaP/Tdap/Td vaccine (2 - Td or Tdap) 02/28/2027   Colon Cancer Screening  01/21/2031   Pneumococcal Vaccine for age over 78  Completed   Hepatitis C Screening  Completed   HIV Screening  Completed   HPV Vaccine  Aged Out   Meningitis B Vaccine  Aged Out       07/16/2024    4:04 PM  Advanced Directives  Does Patient Have a Medical Advance Directive? No  Would patient like  information on creating a medical advance directive? No - Patient declined    Vision: Annual vision screenings are recommended for early detection of glaucoma, cataracts, and diabetic retinopathy. These exams can also reveal signs of chronic conditions such as diabetes and high blood pressure.  Dental: Annual dental screenings help detect early signs of oral cancer, gum disease, and other conditions linked to overall health, including heart disease and diabetes.  Please see the attached documents for additional preventive care recommendations.

## 2024-07-16 NOTE — Telephone Encounter (Signed)
 Called patient vm not setup, sent a mychart message to call about mammogram to be scheduled

## 2024-07-25 ENCOUNTER — Inpatient Hospital Stay (HOSPITAL_COMMUNITY)
Admission: EM | Admit: 2024-07-25 | Discharge: 2024-07-27 | DRG: 101 | Disposition: A | Discharge status: Home / Self Care | Attending: Internal Medicine | Admitting: Internal Medicine

## 2024-07-25 ENCOUNTER — Other Ambulatory Visit: Payer: Self-pay

## 2024-07-25 ENCOUNTER — Emergency Department (HOSPITAL_COMMUNITY)

## 2024-07-25 DIAGNOSIS — Z8541 Personal history of malignant neoplasm of cervix uteri: Secondary | ICD-10-CM

## 2024-07-25 DIAGNOSIS — R569 Unspecified convulsions: Secondary | ICD-10-CM | POA: Diagnosis present

## 2024-07-25 DIAGNOSIS — F1010 Alcohol abuse, uncomplicated: Secondary | ICD-10-CM | POA: Diagnosis not present

## 2024-07-25 DIAGNOSIS — R718 Other abnormality of red blood cells: Secondary | ICD-10-CM | POA: Diagnosis not present

## 2024-07-25 DIAGNOSIS — F17210 Nicotine dependence, cigarettes, uncomplicated: Secondary | ICD-10-CM | POA: Diagnosis present

## 2024-07-25 DIAGNOSIS — E11649 Type 2 diabetes mellitus with hypoglycemia without coma: Secondary | ICD-10-CM | POA: Diagnosis present

## 2024-07-25 DIAGNOSIS — Z720 Tobacco use: Secondary | ICD-10-CM | POA: Diagnosis present

## 2024-07-25 DIAGNOSIS — Z9102 Food additives allergy status: Secondary | ICD-10-CM

## 2024-07-25 DIAGNOSIS — E8729 Other acidosis: Secondary | ICD-10-CM | POA: Diagnosis not present

## 2024-07-25 DIAGNOSIS — K219 Gastro-esophageal reflux disease without esophagitis: Secondary | ICD-10-CM | POA: Diagnosis present

## 2024-07-25 DIAGNOSIS — R636 Underweight: Secondary | ICD-10-CM | POA: Diagnosis present

## 2024-07-25 DIAGNOSIS — F1090 Alcohol use, unspecified, uncomplicated: Secondary | ICD-10-CM | POA: Diagnosis present

## 2024-07-25 DIAGNOSIS — Z23 Encounter for immunization: Secondary | ICD-10-CM | POA: Diagnosis present

## 2024-07-25 DIAGNOSIS — D72823 Leukemoid reaction: Secondary | ICD-10-CM | POA: Diagnosis present

## 2024-07-25 DIAGNOSIS — F32A Depression, unspecified: Secondary | ICD-10-CM | POA: Diagnosis present

## 2024-07-25 DIAGNOSIS — K642 Third degree hemorrhoids: Secondary | ICD-10-CM | POA: Diagnosis not present

## 2024-07-25 DIAGNOSIS — E876 Hypokalemia: Secondary | ICD-10-CM | POA: Diagnosis present

## 2024-07-25 DIAGNOSIS — E874 Mixed disorder of acid-base balance: Secondary | ICD-10-CM | POA: Diagnosis present

## 2024-07-25 DIAGNOSIS — R531 Weakness: Secondary | ICD-10-CM | POA: Diagnosis not present

## 2024-07-25 DIAGNOSIS — Z79899 Other long term (current) drug therapy: Secondary | ICD-10-CM | POA: Diagnosis not present

## 2024-07-25 DIAGNOSIS — Z8249 Family history of ischemic heart disease and other diseases of the circulatory system: Secondary | ICD-10-CM

## 2024-07-25 DIAGNOSIS — G40909 Epilepsy, unspecified, not intractable, without status epilepticus: Secondary | ICD-10-CM | POA: Diagnosis present

## 2024-07-25 DIAGNOSIS — R627 Adult failure to thrive: Secondary | ICD-10-CM | POA: Diagnosis present

## 2024-07-25 DIAGNOSIS — Z681 Body mass index (BMI) 19 or less, adult: Secondary | ICD-10-CM | POA: Diagnosis not present

## 2024-07-25 DIAGNOSIS — E538 Deficiency of other specified B group vitamins: Secondary | ICD-10-CM | POA: Diagnosis present

## 2024-07-25 DIAGNOSIS — K5900 Constipation, unspecified: Secondary | ICD-10-CM | POA: Diagnosis present

## 2024-07-25 LAB — URINALYSIS, ROUTINE W REFLEX MICROSCOPIC
Bacteria, UA: NONE SEEN
Bilirubin Urine: NEGATIVE
Glucose, UA: NEGATIVE mg/dL
Ketones, ur: 20 mg/dL — AB
Nitrite: NEGATIVE
Protein, ur: 30 mg/dL — AB
Specific Gravity, Urine: 1.023 (ref 1.005–1.030)
pH: 5 (ref 5.0–8.0)

## 2024-07-25 LAB — CBC WITH DIFFERENTIAL/PLATELET
Abs Immature Granulocytes: 0.06 K/uL (ref 0.00–0.07)
Basophils Absolute: 0 K/uL (ref 0.0–0.1)
Basophils Relative: 0 %
Eosinophils Absolute: 0 K/uL (ref 0.0–0.5)
Eosinophils Relative: 0 %
HCT: 41.7 % (ref 36.0–46.0)
Hemoglobin: 14 g/dL (ref 12.0–15.0)
Immature Granulocytes: 0 %
Lymphocytes Relative: 9 %
Lymphs Abs: 1.2 K/uL (ref 0.7–4.0)
MCH: 36.6 pg — ABNORMAL HIGH (ref 26.0–34.0)
MCHC: 33.6 g/dL (ref 30.0–36.0)
MCV: 108.9 fL — ABNORMAL HIGH (ref 80.0–100.0)
Monocytes Absolute: 1.5 K/uL — ABNORMAL HIGH (ref 0.1–1.0)
Monocytes Relative: 11 %
Neutro Abs: 11.1 K/uL — ABNORMAL HIGH (ref 1.7–7.7)
Neutrophils Relative %: 80 %
Platelets: 210 K/uL (ref 150–400)
RBC: 3.83 MIL/uL — ABNORMAL LOW (ref 3.87–5.11)
RDW: 14.1 % (ref 11.5–15.5)
WBC: 13.9 K/uL — ABNORMAL HIGH (ref 4.0–10.5)
nRBC: 0 % (ref 0.0–0.2)

## 2024-07-25 LAB — COMPREHENSIVE METABOLIC PANEL WITH GFR
ALT: 44 U/L (ref 0–44)
AST: 99 U/L — ABNORMAL HIGH (ref 15–41)
Albumin: 4.6 g/dL (ref 3.5–5.0)
Alkaline Phosphatase: 146 U/L — ABNORMAL HIGH (ref 38–126)
Anion gap: 30 — ABNORMAL HIGH (ref 5–15)
BUN: 8 mg/dL (ref 6–20)
CO2: 11 mmol/L — ABNORMAL LOW (ref 22–32)
Calcium: 9.4 mg/dL (ref 8.9–10.3)
Chloride: 98 mmol/L (ref 98–111)
Creatinine, Ser: 0.65 mg/dL (ref 0.44–1.00)
GFR, Estimated: >60 mL/min
Glucose, Bld: 122 mg/dL — ABNORMAL HIGH (ref 70–99)
Potassium: 4 mmol/L (ref 3.5–5.1)
Sodium: 139 mmol/L (ref 135–145)
Total Bilirubin: 0.9 mg/dL (ref 0.0–1.2)
Total Protein: 7.5 g/dL (ref 6.5–8.1)

## 2024-07-25 LAB — URINE DRUG SCREEN
Amphetamines: NEGATIVE
Barbiturates: NEGATIVE
Benzodiazepines: POSITIVE — AB
Cocaine: NEGATIVE
Fentanyl: NEGATIVE
Methadone Scn, Ur: NEGATIVE
Opiates: NEGATIVE
Tetrahydrocannabinol: POSITIVE — AB

## 2024-07-25 LAB — BASIC METABOLIC PANEL WITH GFR
Anion gap: 13 (ref 5–15)
BUN: 8 mg/dL (ref 6–20)
CO2: 20 mmol/L — ABNORMAL LOW (ref 22–32)
Calcium: 8 mg/dL — ABNORMAL LOW (ref 8.9–10.3)
Chloride: 105 mmol/L (ref 98–111)
Creatinine, Ser: 0.53 mg/dL (ref 0.44–1.00)
GFR, Estimated: >60 mL/min
Glucose, Bld: 69 mg/dL — ABNORMAL LOW (ref 70–99)
Potassium: 4.1 mmol/L (ref 3.5–5.1)
Sodium: 138 mmol/L (ref 135–145)

## 2024-07-25 LAB — ETHANOL: Alcohol, Ethyl (B): <15 mg/dL

## 2024-07-25 LAB — MAGNESIUM: Magnesium: 2 mg/dL (ref 1.7–2.4)

## 2024-07-25 MED ORDER — LORAZEPAM 2 MG/ML IJ SOLN: 1.0000 mg | Freq: Once | INTRAMUSCULAR | Status: DC

## 2024-07-25 MED ORDER — SODIUM CHLORIDE 0.9 % IV BOLUS
1000.0000 mL | Freq: Once | INTRAVENOUS | Status: AC
  Administered 2024-07-25: 1000 mL via INTRAVENOUS

## 2024-07-25 MED ORDER — HALOPERIDOL LACTATE 5 MG/ML IJ SOLN
5.0000 mg | Freq: Once | INTRAMUSCULAR | Status: AC
  Administered 2024-07-25: 5 mg via INTRAMUSCULAR
  Filled 2024-07-25: qty 1

## 2024-07-25 MED ORDER — FOLIC ACID 5 MG/ML IJ SOLN
1.0000 mg | Freq: Once | INTRAMUSCULAR | Status: AC
  Administered 2024-07-25: 1 mg via INTRAVENOUS
  Filled 2024-07-25 (×2): qty 0.2

## 2024-07-25 MED ORDER — SODIUM CHLORIDE 0.9 % IV SOLN: 1.0000 mg | Freq: Once | INTRAVENOUS | Status: DC

## 2024-07-25 MED ORDER — LEVETIRACETAM (KEPPRA) 500 MG/5 ML ADULT IV PUSH
2000.0000 mg | Freq: Once | INTRAVENOUS | Status: AC
  Administered 2024-07-25: 2000 mg via INTRAVENOUS
  Filled 2024-07-25: qty 20

## 2024-07-25 MED ORDER — THIAMINE HCL 100 MG/ML IJ SOLN
100.0000 mg | Freq: Once | INTRAMUSCULAR | Status: AC
  Administered 2024-07-25: 100 mg via INTRAVENOUS
  Filled 2024-07-25: qty 2

## 2024-07-25 MED ORDER — LORAZEPAM 2 MG/ML IJ SOLN: 2.0000 mg | Freq: Once | INTRAMUSCULAR | Status: DC

## 2024-07-25 MED ORDER — LORAZEPAM 2 MG/ML IJ SOLN
1.0000 mg | Freq: Once | INTRAMUSCULAR | Status: AC
  Administered 2024-07-25: 1 mg via INTRAVENOUS
  Filled 2024-07-25: qty 1

## 2024-07-25 NOTE — Plan of Care (Signed)
 Briefly, [name] is a 53 y.o. female with hx of cervical cancer, depression who was found seizing by her husband as he got out of shower. Lated ? , post ictal for 15 mins. Enroute, had another seizure that ws witnessed by EMS. Labs highly concerning for chronic longterm alcohol use with AST>ALT, elevated MCV/MCH. EtOh levels are negative but been elevated in the past. She was given Keppra . CT head negative. She was combative in the post ictal phase and did receive ativan  for agitation.  This is her first time seizure and she is still out of it and briefly arouses to sternal rub.   Plan: - Load with Keppra  - Clarify EtOh hx with husband and see if CIWA monitoring is needed. - No neurology at [facility] over the weekend. No rEEG at [facility] over the weekend. - If being admitted for observation overnight, will be better served at [facility] - If she is observed overnight, recommend MRI Brain w/o contrast and a routine EEG. If negative, no further workup. - no driving for 6 months. Has to be seizure free before she can resume driving.  Plan discussed with Dr. Francesca with the ED team.  Rami Waddle [facility]

## 2024-07-25 NOTE — ED Notes (Signed)
 Husband now at bedside. States patient was shaking violently and her eyes rolled back into her head. Husband states this activity lasted for 25 mins. She would stop and start breathing during this activity.

## 2024-07-25 NOTE — ED Notes (Signed)
 Pt had clean linens, gown, and pericare performed.

## 2024-07-25 NOTE — ED Triage Notes (Signed)
 Patient arrives via [facility] c/c seizures. Husband called EMS as patient was found down seizing. Found on scene by EMS to be post-ictal. Patient seized again as EMS was getting to the hospital. No known seizure hx.

## 2024-07-25 NOTE — ED Provider Notes (Signed)
 " [place] EMERGENCY DEPARTMENT AT [facility] Provider Note  CSN: [id number] Arrival date & time: 07/25/24 1828  Chief Complaint(s) Seizures  HPI Marissa Frank is a 53 y.o. female with significant past medical history presenting to the emergency department seizure.  Husband reports that he got out of the shower and then saw her leg kicking, looked at her and saw that she was foaming at the mouth.  Reports that she was shaking for about 10 minutes and then just seemed very confused for another 15 minutes until paramedics arrived.  Paramedics report that she was able to answer questions and follow commands and was improving.  They arrived to the hospital she had a recurrent seizure.  This stopped by the time they arrived to the patient room.  Husband denies any other recent history such as fevers or chills, behavior change.  History is otherwise limited due to patient is obtunded   Past Medical History Past Medical History:  Diagnosis Date   Allergy    Asthma    Cervical cancer (HCC)    Depression    Hemorrhoids    Patient Active Problem List   Diagnosis Date Noted   Seizures (HCC) 07/25/2024   Bilateral dry eyes 05/13/2024   Vitamin D  deficiency 05/13/2024   Iron deficiency anemia 05/13/2024   Hypokalemia 05/13/2024   IFG (impaired fasting glucose) 11/13/2023   Vitamin B12 deficiency 11/13/2023   TSH (thyroid-stimulating hormone deficiency) 11/13/2023   Lipid screening 11/13/2023   Transient paralysis 11/13/2023   Need for Streptococcus pneumoniae vaccination 11/13/2023   Iron disorder 06/03/2023   Elevated liver enzymes 06/03/2023   Tobacco use 06/03/2023   Dysuria 05/10/2023   Diffuse pain 05/10/2023   Prolapsed internal hemorrhoids, grade 3 12/21/2021   GERD (gastroesophageal reflux disease) 11/21/2021   Severe episode of recurrent major depressive disorder, with psychotic features (HCC)    Neuropathic pain 09/12/2021   Bloody discharge from left  nipple 06/15/2021   Dysphagia 11/16/2020   Peptic ulcer disease 11/16/2020   Constipation 04/18/2018   Rectal bleeding 04/18/2018   Normocytic anemia 04/18/2018   Abdominal pain, chronic, epigastric 04/18/2018   Home Medication(s) Prior to Admission medications  Medication Sig Start Date End Date Taking? Authorizing Provider  acetaminophen  (TYLENOL ) 500 MG tablet Take 500 mg by mouth every 6 (six) hours as needed.    [provider]  albuterol  (PROVENTIL ) (2.5 MG/3ML) 0.083% nebulizer solution Take 3 mLs (2.5 mg total) by nebulization every 6 (six) hours as needed for wheezing or shortness of breath. 08/23/22   Chandra Harlene LABOR, NP  albuterol  (VENTOLIN  HFA) 108 (90 Base) MCG/ACT inhaler Inhale 1-2 puffs into the lungs every 6 (six) hours as needed for wheezing or shortness of breath. 12/06/23   Bevely Doffing, FNP  benzonatate  (TESSALON ) 100 MG capsule Take 1 capsule (100 mg total) by mouth 3 (three) times daily as needed for cough. Do not take with alcohol or while driving or operating heavy machinery.  May cause drowsiness. 08/23/22   Chandra Harlene LABOR, NP  Cholecalciferol (VITAMIN D3) 50 MCG (2000 UT) capsule Take 1 capsule (2,000 Units total) by mouth daily. 11/20/23   Del Orbe Polanco, Iliana, FNP  cycloSPORINE  (RESTASIS ) 0.05 % ophthalmic emulsion Place 1 drop into both eyes 2 (two) times daily. 05/08/24   Bevely Doffing, FNP  DULoxetine  (CYMBALTA ) 30 MG capsule Take 1 capsule (30 mg total) by mouth daily. 11/20/23   Del Wilhelmena Lloyd Sola, FNP  fluticasone  (FLONASE ) 50 MCG/ACT nasal spray  Place 1 spray into both nostrils daily. 08/23/22   Chandra Harlene LABOR, NP  folic acid  (FOLVITE ) 1 MG tablet Take 1 tablet (1 mg total) by mouth daily. 06/05/23   Kandala, Hyndavi, MD  gabapentin  (NEURONTIN ) 300 MG capsule Take 1 capsule (300 mg total) by mouth 3 (three) times daily. 11/20/23   Del Orbe Polanco, Iliana, FNP  HYDROcodone -acetaminophen  (NORCO/VICODIN) 5-325 MG tablet Take 1 tablet  by mouth every 6 (six) hours as needed for severe pain (pain score 7-10). 11/24/23   Roselyn Carlin NOVAK, MD  linaclotide  (LINZESS ) 290 MCG CAPS capsule Take 1 capsule (290 mcg total) by mouth daily before breakfast. 11/20/23   Del Wilhelmena Lloyd Sola, FNP  Naphazoline-Polyethyl Glycol (EYE DROPS) 0.012-0.2 % SOLN One drop each eye as needed 11/20/23   Del Orbe Polanco, Iliana, FNP  omeprazole  (PRILOSEC) 40 MG capsule Take 1 capsule (40 mg total) by mouth daily. 30 minutes before breakfast 11/20/23   Del Wilhelmena Lloyd, [place], FNP  ondansetron  (ZOFRAN -ODT) 4 MG disintegrating tablet Take 1 tablet (4 mg total) by mouth every 8 (eight) hours as needed for nausea or vomiting. 11/24/23   Roselyn Carlin NOVAK, MD  solifenacin  (VESICARE ) 5 MG tablet Take 1 tablet (5 mg total) by mouth daily. 11/20/23   Del Wilhelmena Lloyd Sola, FNP  Vitamin D , Ergocalciferol , (DRISDOL ) 1.25 MG (50000 UNIT) CAPS capsule Take 1 capsule (50,000 Units total) by mouth every 7 (seven) days. 12/01/23   Bevely Doffing, FNP                                                                                                                                    Past Surgical History Past Surgical History:  Procedure Laterality Date   BIOPSY  04/24/2018   Procedure: BIOPSY;  Surgeon: Shaaron Lamar HERO, MD;  Location: [facility];  Service: Endoscopy;;   BIOPSY  01/20/2021   Procedure: BIOPSY;  Surgeon: Shaaron Lamar HERO, MD;  Location: [facility];  Service: Endoscopy;;  gastric   BREAST BIOPSY Left 08/25/2021   Procedure: BREAST BIOPSY WITH DUCT EXCISIONS;  Surgeon: Kallie Manuelita BROCKS, MD;  Location: [facility];  Service: General;  Laterality: Left;   BREAST SURGERY     benign tumor   CERVICAL CONE BIOPSY     COLONOSCOPY WITH PROPOFOL  N/A 01/20/2021   hemorrhoids, one 5 mm polyp in sigmoid colon, simgoid and descending colon diverticulosis. leiomyoma   ESOPHAGOGASTRODUODENOSCOPY (EGD) WITH PROPOFOL  N/A 04/24/2018   reflux esophagitis s/p dilation,  small hiatal hernia, multiple small gastric ulcers s/p biopsy, reactive gastropathy, no erosions. +NSAIDs at that time.   ESOPHAGOGASTRODUODENOSCOPY (EGD) WITH PROPOFOL  N/A 01/20/2021   normal esophagus s/p dilation, small hiatal hernia, antral erosions s/p biopsy. Normal duodenum.   MALONEY DILATION N/A 04/24/2018   Procedure: AGAPITO DILATION;  Surgeon: Shaaron Lamar HERO, MD;  Location: [facility];  Service: Endoscopy;  Laterality: N/A;   MALONEY DILATION N/A 01/20/2021  Procedure: MALONEY DILATION;  Surgeon: Shaaron Lamar HERO, MD;  Location: [facility];  Service: Endoscopy;  Laterality: N/A;   POLYPECTOMY  01/20/2021   Procedure: POLYPECTOMY;  Surgeon: Shaaron Lamar HERO, MD;  Location: [facility];  Service: Endoscopy;;   TUBAL LIGATION     WISDOM TOOTH EXTRACTION     Family History Family History  Problem Relation Age of Onset   Hypertension Mother    Stroke Mother    Colon cancer Neg Hx    Colon polyps Neg Hx    Stomach cancer Neg Hx    Liver disease Neg Hx     Social History Social History[1] Allergies Almond oil, Carrot [daucus carota], Celery oil, Coconut flavoring agent (non-screening), and Macadamia nut oil  Review of Systems Review of Systems  Unable to perform ROS: Patient nonverbal    Physical Exam Vital Signs  I have reviewed the triage vital signs BP 112/78   Pulse 77   Temp 99 F (37.2 C) (Axillary)   Resp 15   Ht 5' 4 (1.626 m)   Wt 39.5 kg   LMP 06/29/2018   SpO2 99%   BMI 14.93 kg/m  Physical Exam Vitals and nursing note reviewed.  Constitutional:      General: She is not in acute distress.    Appearance: She is well-developed.     Comments: combative  HENT:     Head: Normocephalic and atraumatic.     Mouth/Throat:     Mouth: Mucous membranes are moist.  Eyes:     Pupils: Pupils are equal, round, and reactive to light.     Comments: No gaze deviation  Cardiovascular:     Rate and Rhythm: Regular rhythm. Tachycardia present.      Heart sounds: No murmur heard. Pulmonary:     Effort: Pulmonary effort is normal. No respiratory distress.     Breath sounds: Normal breath sounds.  Abdominal:     General: Abdomen is flat.     Palpations: Abdomen is soft.     Tenderness: There is no abdominal tenderness.  Musculoskeletal:        General: No tenderness.     Right lower leg: No edema.     Left lower leg: No edema.  Skin:    General: Skin is warm and dry.  Neurological:     General: No focal deficit present.     Comments: Moves all 4 extremities equally, no cranial nerve deficit  Psychiatric:        Mood and Affect: Mood normal.        Behavior: Behavior normal.     ED Results and Treatments Labs (all labs ordered are listed, but only abnormal results are displayed) Labs Reviewed  COMPREHENSIVE METABOLIC PANEL WITH GFR - Abnormal; Notable for the following components:      Result Value   CO2 11 (*)    Glucose, Bld 122 (*)    AST 99 (*)    Alkaline Phosphatase 146 (*)    Anion gap 30 (*)    All other components within normal limits  CBC WITH DIFFERENTIAL/PLATELET - Abnormal; Notable for the following components:   WBC 13.9 (*)    RBC 3.83 (*)    MCV 108.9 (*)    MCH 36.6 (*)    Neutro Abs 11.1 (*)    Monocytes Absolute 1.5 (*)    All other components within normal limits  URINE DRUG SCREEN - Abnormal; Notable for the following components:   Benzodiazepines  POSITIVE (*)    Tetrahydrocannabinol POSITIVE (*)    All other components within normal limits  URINALYSIS, ROUTINE W REFLEX MICROSCOPIC - Abnormal; Notable for the following components:   APPearance HAZY (*)    Hgb urine dipstick SMALL (*)    Ketones, ur 20 (*)    Protein, ur 30 (*)    Leukocytes,Ua SMALL (*)    All other components within normal limits  BASIC METABOLIC PANEL WITH GFR - Abnormal; Notable for the following components:   CO2 20 (*)    Glucose, Bld 69 (*)    Calcium 8.0 (*)    All other components within normal limits   MAGNESIUM  ETHANOL  VITAMIN B1  HIV ANTIBODY (ROUTINE TESTING W REFLEX)  CBC  COMPREHENSIVE METABOLIC PANEL WITH GFR  CBC  MAGNESIUM  PHOSPHORUS                                                                                                                          Radiology CT Head Wo Contrast Result Date: 07/25/2024 CLINICAL DATA:  Seizure. EXAM: CT HEAD WITHOUT CONTRAST TECHNIQUE: Contiguous axial images were obtained from the base of the skull through the vertex without intravenous contrast. RADIATION DOSE REDUCTION: This exam was performed according to the departmental dose-optimization program which includes automated exposure control, adjustment of the mA and/or kV according to patient size and/or use of iterative reconstruction technique. COMPARISON:  September 14, 2019 FINDINGS: Brain: No evidence of acute infarction, hemorrhage, hydrocephalus, extra-axial collection or mass lesion/mass effect. Cavum septum pellucidum et vergae variant is noted. Vascular: No hyperdense vessel or unexpected calcification. Skull: Normal. Negative for fracture or focal lesion. Sinuses/Orbits: No acute finding. Other: None. IMPRESSION: No acute intracranial pathology. Electronically Signed   By: Suzen Dials M.D.   On: 07/25/2024 20:22    Pertinent labs & imaging results that were available during my care of the patient were reviewed by me and considered in my medical decision making (see MDM for details).  Medications Ordered in ED Medications  acetaminophen  (TYLENOL ) tablet 650 mg (has no administration in time range)    Or  acetaminophen  (TYLENOL ) suppository 650 mg (has no administration in time range)  ondansetron  (ZOFRAN ) tablet 4 mg (has no administration in time range)    Or  ondansetron  (ZOFRAN ) injection 4 mg (has no administration in time range)  enoxaparin  (LOVENOX ) injection 30 mg (has no administration in time range)  sodium chloride  0.9 % bolus 1,000 mL (0 mLs Intravenous Stopped  07/25/24 2121)  levETIRAcetam  (KEPPRA ) undiluted injection 2,000 mg (2,000 mg Intravenous Given 07/25/24 1852)  LORazepam  (ATIVAN ) injection 1 mg (1 mg Intravenous Given 07/25/24 1847)  haloperidol  lactate (HALDOL ) injection 5 mg (5 mg Intramuscular Given 07/25/24 1911)  sodium chloride  0.9 % bolus 1,000 mL (0 mLs Intravenous Stopped 07/25/24 2258)  thiamine  (VITAMIN B1) injection 100 mg (100 mg Intravenous Given 07/25/24 2308)  folic acid  injection 1 mg (1 mg Intravenous Given 07/25/24 2306)  Procedures Procedures  (including critical care time)  Medical Decision Making / ED Course   MDM:  53 year old presenting to the emergency department with seizure.  Patient overall seems well-appearing but initially combative.  Required restraint for patient's safety and staff safety.  Has improved after receiving sedating medication will attempt to remove restraint.  Patient also received Keppra  given multiple seizures witnessed by husband and paramedic.  Will initiate first-time seizure workup.  No known history of seizure.  Seems very consistent with seizure rather than other medical issue.  Reassess.  Clinical Course as of 07/26/24 0009  Sat Jul 25, 2024  2256 Discussed with Dr. Sharri with neurology, given multiple seizures recommends admission for MRI and EEG.  Agrees with Keppra .  Recommended asking patient spouse about alcohol, he reports that she does frequently drink but not daily and never has any withdrawal symptoms.  Patient does seem more awake and is able to follow basic commands but remains somnolent.  Discussed with hospitalist who is admitted the patient. [WS]    Clinical Course User Index [WS] Francesca Elsie CROME, MD     Additional history obtained: -Additional history obtained from ems and spouse -External records from outside source  obtained and reviewed including: Chart review including previous notes, labs, imaging, consultation notes including prior notes    Lab Tests: -I ordered, reviewed, and interpreted labs.   The pertinent results include:   Labs Reviewed  COMPREHENSIVE METABOLIC PANEL WITH GFR - Abnormal; Notable for the following components:      Result Value   CO2 11 (*)    Glucose, Bld 122 (*)    AST 99 (*)    Alkaline Phosphatase 146 (*)    Anion gap 30 (*)    All other components within normal limits  CBC WITH DIFFERENTIAL/PLATELET - Abnormal; Notable for the following components:   WBC 13.9 (*)    RBC 3.83 (*)    MCV 108.9 (*)    MCH 36.6 (*)    Neutro Abs 11.1 (*)    Monocytes Absolute 1.5 (*)    All other components within normal limits  URINE DRUG SCREEN - Abnormal; Notable for the following components:   Benzodiazepines POSITIVE (*)    Tetrahydrocannabinol POSITIVE (*)    All other components within normal limits  URINALYSIS, ROUTINE W REFLEX MICROSCOPIC - Abnormal; Notable for the following components:   APPearance HAZY (*)    Hgb urine dipstick SMALL (*)    Ketones, ur 20 (*)    Protein, ur 30 (*)    Leukocytes,Ua SMALL (*)    All other components within normal limits  BASIC METABOLIC PANEL WITH GFR - Abnormal; Notable for the following components:   CO2 20 (*)    Glucose, Bld 69 (*)    Calcium 8.0 (*)    All other components within normal limits  MAGNESIUM  ETHANOL  VITAMIN B1  HIV ANTIBODY (ROUTINE TESTING W REFLEX)  CBC  COMPREHENSIVE METABOLIC PANEL WITH GFR  CBC  MAGNESIUM  PHOSPHORUS    Notable for +THC on uds, mild leukocytosis, anion gap likely due to seizure     Imaging Studies ordered: I ordered imaging studies including CT head  On my interpretation imaging demonstrates no acute process I independently visualized and interpreted imaging. I agree with the radiologist interpretation   Medicines ordered and prescription drug management: Meds ordered this  encounter  Medications   sodium chloride  0.9 % bolus 1,000 mL   levETIRAcetam  (KEPPRA ) undiluted injection 2,000 mg  LORazepam  (ATIVAN ) injection 1 mg   DISCONTD: LORazepam  (ATIVAN ) injection 1 mg   DISCONTD: LORazepam  (ATIVAN ) injection 2 mg   haloperidol  lactate (HALDOL ) injection 5 mg   sodium chloride  0.9 % bolus 1,000 mL   thiamine  (VITAMIN B1) injection 100 mg   DISCONTD: folic acid  1 mg in sodium chloride  0.9 % 50 mL IVPB   folic acid  injection 1 mg   DISCONTD: enoxaparin  (LOVENOX ) injection 40 mg   OR Linked Order Group    acetaminophen  (TYLENOL ) tablet 650 mg    acetaminophen  (TYLENOL ) suppository 650 mg   OR Linked Order Group    ondansetron  (ZOFRAN ) tablet 4 mg    ondansetron  (ZOFRAN ) injection 4 mg   enoxaparin  (LOVENOX ) injection 30 mg    -I have reviewed the patients home medicines and have made adjustments as needed   Social Determinants of Health:  Diagnosis or treatment significantly limited by social determinants of health: alcohol use   Reevaluation: After the interventions noted above, I reevaluated the patient and found that their symptoms have improved  Co morbidities that complicate the patient evaluation  Past Medical History:  Diagnosis Date   Allergy    Asthma    Cervical cancer (HCC)    Depression    Hemorrhoids       Dispostion: Disposition decision including need for hospitalization was considered, and patient admitted to the hospital.    Final Clinical Impression(s) / ED Diagnoses Final diagnoses:  Seizure (HCC)     This chart was dictated using voice recognition software.  Despite best efforts to proofread,  errors can occur which can change the documentation meaning.     [1]  Social History Tobacco Use   Smoking status: Every Day    Current packs/day: 0.25    Average packs/day: 0.3 packs/day for 34.0 years (8.5 ttl pk-yrs)    Types: Cigarettes    Start date: 66   Smokeless tobacco: Never  Vaping Use   Vaping  status: Former  Substance Use Topics   Alcohol use: Yes    Alcohol/week: 7.0 standard drinks of alcohol    Types: 7 Glasses of wine per week    Comment: 05/23/21-glass of wine nightly   Drug use: Not Currently    Types: Marijuana    Comment: none since teenager     Francesca Elsie CROME, MD 07/26/24 0009  "

## 2024-07-25 NOTE — ED Notes (Addendum)
 This RN was made aware that the pt was in CT and was calm and following commands. Violent restraints were removed and not replaced. MD Scheving made aware.

## 2024-07-25 NOTE — H&P (Incomplete)
 " History and Physical    Patient: Marissa Frank FMW:[id number] DOB: 06/18/1971 DOA: 07/25/2024 DOS: the patient was seen and examined on 07/25/2024 PCP: Terry Wilhelmena Lloyd Hilario, FNP  Patient coming from: {Point_of_Origin:26777}  Chief Complaint:  Chief Complaint  Patient presents with   Seizures   HPI: Marissa Frank is a 53 y.o. female with medical history significant of ***  Review of Systems: {ROS_Text:26778} Past Medical History:  Diagnosis Date   Allergy    Asthma    Cervical cancer (HCC)    Depression    Hemorrhoids    Past Surgical History:  Procedure Laterality Date   BIOPSY  04/24/2018   Procedure: BIOPSY;  Surgeon: Shaaron Lamar HERO, MD;  [facility];  Service: Endoscopy;;   BIOPSY  01/20/2021   Procedure: BIOPSY;  Surgeon: Shaaron Lamar HERO, MD;  [facility];  Service: Endoscopy;;  gastric   BREAST BIOPSY Left 08/25/2021   Procedure: BREAST BIOPSY WITH DUCT EXCISIONS;  Surgeon: Kallie Manuelita BROCKS, MD;  Location: [facility];  Service: General;  Laterality: Left;   BREAST SURGERY     benign tumor   CERVICAL CONE BIOPSY     COLONOSCOPY WITH PROPOFOL  N/A 01/20/2021   hemorrhoids, one 5 mm polyp in sigmoid colon, simgoid and descending colon diverticulosis. leiomyoma   ESOPHAGOGASTRODUODENOSCOPY (EGD) WITH PROPOFOL  N/A 04/24/2018   reflux esophagitis s/p dilation, small hiatal hernia, multiple small gastric ulcers s/p biopsy, reactive gastropathy, no erosions. +NSAIDs at that time.   ESOPHAGOGASTRODUODENOSCOPY (EGD) WITH PROPOFOL  N/A 01/20/2021   normal esophagus s/p dilation, small hiatal hernia, antral erosions s/p biopsy. Normal duodenum.   MALONEY DILATION N/A 04/24/2018   Procedure: AGAPITO DILATION;  Surgeon: Shaaron Lamar HERO, MD;  [facility];  Service: Endoscopy;  Laterality: N/A;   MALONEY DILATION N/A 01/20/2021   Procedure: AGAPITO DILATION;  Surgeon: Shaaron Lamar HERO, MD;  [facility];  Service:  Endoscopy;  Laterality: N/A;   POLYPECTOMY  01/20/2021   Procedure: POLYPECTOMY;  Surgeon: Shaaron Lamar HERO, MD;  [facility];  Service: Endoscopy;;   TUBAL LIGATION     WISDOM TOOTH EXTRACTION     Social History:  reports that she has been smoking cigarettes. She started smoking about 34 years ago. She has a 8.5 pack-year smoking history. She has never used smokeless tobacco. She reports current alcohol use of about 7.0 standard drinks of alcohol per week. She reports that she does not currently use drugs after having used the following drugs: Marijuana.  Allergies[1]  Family History  Problem Relation Age of Onset   Hypertension Mother    Stroke Mother    Colon cancer Neg Hx    Colon polyps Neg Hx    Stomach cancer Neg Hx    Liver disease Neg Hx     Prior to Admission medications  Medication Sig Start Date End Date Taking? Authorizing Provider  acetaminophen  (TYLENOL ) 500 MG tablet Take 500 mg by mouth every 6 (six) hours as needed.    [provider]  albuterol  (PROVENTIL ) (2.5 MG/3ML) 0.083% nebulizer solution Take 3 mLs (2.5 mg total) by nebulization every 6 (six) hours as needed for wheezing or shortness of breath. 08/23/22   Chandra Harlene LABOR, NP  albuterol  (VENTOLIN  HFA) 108 (90 Base) MCG/ACT inhaler Inhale 1-2 puffs into the lungs every 6 (six) hours as needed for wheezing or shortness of breath. 12/06/23   Bevely Doffing, FNP  benzonatate  (TESSALON ) 100 MG capsule Take 1  capsule (100 mg total) by mouth 3 (three) times daily as needed for cough. Do not take with alcohol or while driving or operating heavy machinery.  May cause drowsiness. 08/23/22   Chandra Harlene LABOR, NP  Cholecalciferol (VITAMIN D3) 50 MCG (2000 UT) capsule Take 1 capsule (2,000 Units total) by mouth daily. 11/20/23   Del Orbe Polanco, Iliana, FNP  cycloSPORINE  (RESTASIS ) 0.05 % ophthalmic emulsion Place 1 drop into both eyes 2 (two) times daily. 05/08/24   Bevely Doffing, FNP  DULoxetine   (CYMBALTA ) 30 MG capsule Take 1 capsule (30 mg total) by mouth daily. 11/20/23   Del Orbe Polanco, Iliana, FNP  fluticasone  (FLONASE ) 50 MCG/ACT nasal spray Place 1 spray into both nostrils daily. 08/23/22   Chandra Harlene LABOR, NP  folic acid  (FOLVITE ) 1 MG tablet Take 1 tablet (1 mg total) by mouth daily. 06/05/23   Kandala, Hyndavi, MD  gabapentin  (NEURONTIN ) 300 MG capsule Take 1 capsule (300 mg total) by mouth 3 (three) times daily. 11/20/23   Del Orbe Polanco, Iliana, FNP  HYDROcodone -acetaminophen  (NORCO/VICODIN) 5-325 MG tablet Take 1 tablet by mouth every 6 (six) hours as needed for severe pain (pain score 7-10). 11/24/23   Roselyn Carlin NOVAK, MD  linaclotide  (LINZESS ) 290 MCG CAPS capsule Take 1 capsule (290 mcg total) by mouth daily before breakfast. 11/20/23   Del Wilhelmena Lloyd Sola, FNP  Naphazoline-Polyethyl Glycol (EYE DROPS) 0.012-0.2 % SOLN One drop each eye as needed 11/20/23   Del Orbe Polanco, Iliana, FNP  omeprazole  (PRILOSEC) 40 MG capsule Take 1 capsule (40 mg total) by mouth daily. 30 minutes before breakfast 11/20/23   Del Wilhelmena Lloyd, [place], FNP  ondansetron  (ZOFRAN -ODT) 4 MG disintegrating tablet Take 1 tablet (4 mg total) by mouth every 8 (eight) hours as needed for nausea or vomiting. 11/24/23   Roselyn Carlin NOVAK, MD  solifenacin  (VESICARE ) 5 MG tablet Take 1 tablet (5 mg total) by mouth daily. 11/20/23   Del Orbe Polanco, Iliana, FNP  Vitamin D , Ergocalciferol , (DRISDOL ) 1.25 MG (50000 UNIT) CAPS capsule Take 1 capsule (50,000 Units total) by mouth every 7 (seven) days. 12/01/23   Bevely Doffing, FNP    Physical Exam: Vitals:   07/25/24 2100 07/25/24 2115 07/25/24 2130 07/25/24 2200  BP: 107/76 (!) 125/98 120/89 105/72  Pulse: 88 (!) 110 88 85  Resp: 14 13 16 15   Temp:    99 F (37.2 C)  TempSrc:    Axillary  SpO2: 99% 97% 90% 97%  Weight:    39.5 kg  Height:    5' 4 (1.626 m)   *** Data Reviewed: {Tip this will not be part of the note when signed- Document your  independent interpretation of telemetry tracing, EKG, lab, Radiology test or any other diagnostic tests. Add any new diagnostic test ordered today. (Optional):26781} {Results:26384}  Assessment and Plan: No notes have been filed under this hospital service. Service: Hospitalist     Advance Care Planning:   Code Status: Prior ***  Consults: ***  Family Communication: ***  Severity of Illness: {Observation/Inpatient:21159}  Author: Posey Maier, DO 07/25/2024 10:50 PM  For on call review [email or web address].     [1]  Allergies Allergen Reactions   Almond Oil Itching   Carrot [Daucus Carota] Itching   Celery Oil Itching   Coconut Flavoring Agent (Non-Screening) Nausea And Vomiting and Other (See Comments)    Itchy throat   Macadamia Nut Oil Itching   "

## 2024-07-26 ENCOUNTER — Inpatient Hospital Stay (HOSPITAL_COMMUNITY)

## 2024-07-26 ENCOUNTER — Encounter (HOSPITAL_COMMUNITY): Payer: Self-pay | Admitting: Internal Medicine

## 2024-07-26 ENCOUNTER — Other Ambulatory Visit: Payer: Self-pay

## 2024-07-26 DIAGNOSIS — R569 Unspecified convulsions: Principal | ICD-10-CM

## 2024-07-26 DIAGNOSIS — Z720 Tobacco use: Secondary | ICD-10-CM | POA: Diagnosis not present

## 2024-07-26 DIAGNOSIS — E876 Hypokalemia: Secondary | ICD-10-CM | POA: Diagnosis not present

## 2024-07-26 DIAGNOSIS — K642 Third degree hemorrhoids: Secondary | ICD-10-CM | POA: Diagnosis not present

## 2024-07-26 DIAGNOSIS — F1010 Alcohol abuse, uncomplicated: Secondary | ICD-10-CM | POA: Diagnosis not present

## 2024-07-26 DIAGNOSIS — E538 Deficiency of other specified B group vitamins: Secondary | ICD-10-CM | POA: Diagnosis not present

## 2024-07-26 LAB — COMPREHENSIVE METABOLIC PANEL WITH GFR
ALT: 28 U/L (ref 0–44)
AST: 65 U/L — ABNORMAL HIGH (ref 15–41)
Albumin: 3.6 g/dL (ref 3.5–5.0)
Alkaline Phosphatase: 114 U/L (ref 38–126)
Anion gap: 13 (ref 5–15)
BUN: 7 mg/dL (ref 6–20)
CO2: 21 mmol/L — ABNORMAL LOW (ref 22–32)
Calcium: 8.3 mg/dL — ABNORMAL LOW (ref 8.9–10.3)
Chloride: 105 mmol/L (ref 98–111)
Creatinine, Ser: 0.57 mg/dL (ref 0.44–1.00)
GFR, Estimated: >60 mL/min
Glucose, Bld: 69 mg/dL — ABNORMAL LOW (ref 70–99)
Potassium: 3.4 mmol/L — ABNORMAL LOW (ref 3.5–5.1)
Sodium: 140 mmol/L (ref 135–145)
Total Bilirubin: 1.1 mg/dL (ref 0.0–1.2)
Total Protein: 5.8 g/dL — ABNORMAL LOW (ref 6.5–8.1)

## 2024-07-26 LAB — CBC
HCT: 35.4 % — ABNORMAL LOW (ref 36.0–46.0)
Hemoglobin: 11.9 g/dL — ABNORMAL LOW (ref 12.0–15.0)
MCH: 35.7 pg — ABNORMAL HIGH (ref 26.0–34.0)
MCHC: 33.6 g/dL (ref 30.0–36.0)
MCV: 106.3 fL — ABNORMAL HIGH (ref 80.0–100.0)
Platelets: 245 K/uL (ref 150–400)
RBC: 3.33 MIL/uL — ABNORMAL LOW (ref 3.87–5.11)
RDW: 14 % (ref 11.5–15.5)
WBC: 8.9 K/uL (ref 4.0–10.5)
nRBC: 0 % (ref 0.0–0.2)

## 2024-07-26 LAB — MAGNESIUM: Magnesium: 1.9 mg/dL (ref 1.7–2.4)

## 2024-07-26 LAB — HIV ANTIBODY (ROUTINE TESTING W REFLEX): HIV Screen 4th Generation wRfx: NONREACTIVE

## 2024-07-26 LAB — PHOSPHORUS: Phosphorus: 2.3 mg/dL — ABNORMAL LOW (ref 2.5–4.6)

## 2024-07-26 LAB — VITAMIN B12: Vitamin B-12: 371 pg/mL (ref 180–914)

## 2024-07-26 LAB — CBG MONITORING, ED
Glucose-Capillary: 69 mg/dL — ABNORMAL LOW (ref 70–99)
Glucose-Capillary: 69 mg/dL — ABNORMAL LOW (ref 70–99)
Glucose-Capillary: 130 mg/dL — ABNORMAL HIGH (ref 70–99)

## 2024-07-26 LAB — FOLATE: Folate: >20 ng/mL

## 2024-07-26 MED ORDER — ENOXAPARIN SODIUM 40 MG/0.4ML IJ SOSY: 40.0000 mg | PREFILLED_SYRINGE | INTRAMUSCULAR | Status: DC

## 2024-07-26 MED ORDER — FOLIC ACID 1 MG PO TABS
1.0000 mg | ORAL_TABLET | Freq: Every day | ORAL | Status: DC
  Administered 2024-07-26 – 2024-07-27 (×2): 1 mg via ORAL
  Filled 2024-07-26: qty 1

## 2024-07-26 MED ORDER — LORAZEPAM 2 MG/ML IJ SOLN: 1.0000 mg | INTRAMUSCULAR | Status: DC | PRN

## 2024-07-26 MED ORDER — DEXTROSE-SODIUM CHLORIDE 5-0.9 % IV SOLN: INTRAVENOUS | Status: AC

## 2024-07-26 MED ORDER — LINACLOTIDE 145 MCG PO CAPS
290.0000 ug | ORAL_CAPSULE | Freq: Every day | ORAL | Status: DC
  Administered 2024-07-26 – 2024-07-27 (×2): 290 ug via ORAL
  Filled 2024-07-26: qty 2

## 2024-07-26 MED ORDER — ALBUTEROL SULFATE HFA 108 (90 BASE) MCG/ACT IN AERS: 1.0000 | INHALATION_SPRAY | Freq: Four times a day (QID) | RESPIRATORY_TRACT | Status: DC | PRN

## 2024-07-26 MED ORDER — ONDANSETRON HCL 4 MG/2ML IJ SOLN: 4.0000 mg | Freq: Four times a day (QID) | INTRAMUSCULAR | Status: DC | PRN

## 2024-07-26 MED ORDER — GADOBUTROL 1 MMOL/ML IV SOLN
3.0000 mL | Freq: Once | INTRAVENOUS | Status: AC | PRN
  Administered 2024-07-26: 3 mL via INTRAVENOUS

## 2024-07-26 MED ORDER — ONDANSETRON HCL 4 MG PO TABS
4.0000 mg | ORAL_TABLET | Freq: Four times a day (QID) | ORAL | Status: DC | PRN
  Administered 2024-07-26: 4 mg via ORAL
  Filled 2024-07-26: qty 1

## 2024-07-26 MED ORDER — THIAMINE MONONITRATE 100 MG PO TABS
100.0000 mg | ORAL_TABLET | Freq: Every day | ORAL | Status: DC
  Administered 2024-07-26 – 2024-07-27 (×2): 100 mg via ORAL
  Filled 2024-07-26: qty 1

## 2024-07-26 MED ORDER — ACETAMINOPHEN 650 MG RE SUPP: 650.0000 mg | Freq: Four times a day (QID) | RECTAL | Status: DC | PRN

## 2024-07-26 MED ORDER — ENSURE PLUS HIGH PROTEIN PO LIQD: 237.0000 mL | Freq: Three times a day (TID) | ORAL | Status: DC

## 2024-07-26 MED ORDER — LORAZEPAM 1 MG PO TABS
1.0000 mg | ORAL_TABLET | ORAL | Status: DC | PRN
  Administered 2024-07-26 – 2024-07-27 (×2): 1 mg via ORAL
  Filled 2024-07-26: qty 1

## 2024-07-26 MED ORDER — ADULT MULTIVITAMIN W/MINERALS CH
1.0000 | ORAL_TABLET | Freq: Every day | ORAL | Status: DC
  Administered 2024-07-26 – 2024-07-27 (×2): 1 via ORAL
  Filled 2024-07-26: qty 1

## 2024-07-26 MED ORDER — PANTOPRAZOLE SODIUM 40 MG PO TBEC
80.0000 mg | DELAYED_RELEASE_TABLET | Freq: Every day | ORAL | Status: DC
  Administered 2024-07-26 – 2024-07-27 (×2): 80 mg via ORAL
  Filled 2024-07-26: qty 2

## 2024-07-26 MED ORDER — ENSURE PLUS HIGH PROTEIN PO LIQD
237.0000 mL | Freq: Two times a day (BID) | ORAL | Status: DC
  Filled 2024-07-26 (×6): qty 237

## 2024-07-26 MED ORDER — POTASSIUM CHLORIDE CRYS ER 20 MEQ PO TBCR
20.0000 meq | EXTENDED_RELEASE_TABLET | Freq: Once | ORAL | Status: AC
  Administered 2024-07-26: 20 meq via ORAL
  Filled 2024-07-26: qty 1

## 2024-07-26 MED ORDER — LEVETIRACETAM (KEPPRA) 500 MG/5 ML ADULT IV PUSH
500.0000 mg | Freq: Two times a day (BID) | INTRAVENOUS | Status: DC
  Administered 2024-07-26 – 2024-07-27 (×2): 500 mg via INTRAVENOUS
  Filled 2024-07-26: qty 5

## 2024-07-26 MED ORDER — ACETAMINOPHEN 325 MG PO TABS
650.0000 mg | ORAL_TABLET | Freq: Four times a day (QID) | ORAL | Status: DC | PRN
  Administered 2024-07-26: 650 mg via ORAL
  Filled 2024-07-26: qty 2

## 2024-07-26 MED ORDER — INFLUENZA VIRUS VACC SPLIT PF (FLUZONE) 0.5 ML IM SUSY
0.5000 mL | PREFILLED_SYRINGE | INTRAMUSCULAR | Status: AC
  Administered 2024-07-27: 0.5 mL via INTRAMUSCULAR

## 2024-07-26 MED ORDER — THIAMINE HCL 100 MG/ML IJ SOLN: 100.0000 mg | Freq: Every day | INTRAMUSCULAR | Status: DC

## 2024-07-26 MED ORDER — ENOXAPARIN SODIUM 30 MG/0.3ML IJ SOSY
30.0000 mg | PREFILLED_SYRINGE | INTRAMUSCULAR | Status: DC
  Administered 2024-07-26 – 2024-07-27 (×2): 30 mg via SUBCUTANEOUS
  Filled 2024-07-26: qty 0.3

## 2024-07-26 NOTE — Discharge Instructions (Signed)

## 2024-07-26 NOTE — Progress Notes (Signed)
 "          PROGRESS NOTE  DULCE MARTIAN FMW:[id number] DOB: [birth year] DOA: 07/25/2024 PCP: Terry Wilhelmena Lloyd Hilario, FNP  Brief History82  53 year old female with a history of iron overload, low B12, tobacco abuse, GERD, constipation, internal hemorrhoids, asthma presenting with witnessed seizure by her fianc.  Apparently her fianc was getting out of the shower and saw the patient foaming at the mouth and shaking in her legs.  It lasted about 10 minutes.  She was confused thereafter.  EMS was activated.  Per EMS, the patient had another tonic-clonic episode in route to the hospital.  In the ED, the patient was initially combative and history was unobtainable initially.  On the morning of 07/26/2024 the patient is awake and alert and conversant.  She states that she had a seizure about 1 to 2 months ago.  She drinks wine on a daily basis, about 1 to 2 glasses.  She states that she has been doing this for about 5 years.  She states she has not had any periods of abstinence, and she was still drinking at the time of her last seizure.  She denies any new medications.  She denies any illicit substances.  She does smoke marijuana occasionally.  She smokes about 1/2 to 1/3 pack/day of tobacco.  Her last drink of alcohol was 07/25/2024 In the ED, the patient was afebrile hemodynamically stable with oxygen  saturation 99% on room air. WBC 13.9, hemoglobin 14.0, platelet 210. Sodium 139, potassium 4.0, bicarbonate 11, serum creatinine 0.65.  AST 99, ALT 44, alk phosphatase 146, total bilirubin 0.9.  The patient was given 2 g Keppra .  CT of the brain was negative.  EDP spoke with neurology, Dr. Vanessa who requested transfer to [facility] for MR and EEG.   Assessment/Plan: Seizure disorder - Continue IV Keppra  - EEG - MRI brain - UDS positive for benzodiazepines and THC - UA negative for pyuria - CT brain negative for acute findings  Metabolic alkalosis/alcohol ketosis - Presented with bicarbonate  11, anion gap 30. - Improved with IV fluids  Hypokalemia - Replete -check mag  Alcohol abuse - Alcohol withdrawal protocol  Leukemoid reaction - Secondary to stress demargination - Improved - She remains afebrile hemodynamically stable  Hypoglycemia - A.m. cortisol - Start D5W -TSH - 05/08/2024 hemoglobin A1c 4.9          Family Communication:  no Family at bedside  Consultants:  neurology--Khaliqdina  Code Status:  FULL   DVT Prophylaxis:   Pierce Lovenox    Procedures: As Listed in Progress Note Above  Antibiotics: None       Subjective: Patient denies fevers, chills, headache, chest pain, dyspnea, nausea, vomiting, diarrhea, abdominal pain, dysuria, hematuria, hematochezia, and melena.   Objective: Vitals:   07/26/24 0500 07/26/24 0600 07/26/24 0633 07/26/24 0730  BP: 105/76 124/74  120/68  Pulse: 76 74  67  Resp: 16 11  15   Temp:   98.8 F (37.1 C)   TempSrc:   Oral   SpO2: 96% 100%  100%  Weight:      Height:       No intake or output data in the 24 hours ending 07/26/24 0825 Weight change:  Exam:  General:  Pt is alert, follows commands appropriately, not in acute distress HEENT: No icterus, No thrush, No neck mass, [place]/AT Cardiovascular: RRR, S1/S2, no rubs, no gallops Respiratory: CTA bilaterally, no wheezing, no crackles, no rhonchi Abdomen: Soft/+BS, non tender, non distended, no guarding  Extremities: No edema, No lymphangitis, No petechiae, No rashes, no synovitis Neuro:  CN II-XII intact, strength 4/5 in RUE, RLE, strength 4/5 LUE, LLE; sensation intact bilateral; no dysmetria; babinski equivocal    Data Reviewed: I have personally reviewed following labs and imaging studies Basic Metabolic Panel: Recent Labs  Lab 07/25/24 1859 07/25/24 2250 07/26/24 0500  NA 139 138 140  K 4.0 4.1 3.4*  CL 98 105 105  CO2 11* 20* 21*  GLUCOSE 122* 69* 69*  BUN 8 8 7   CREATININE 0.65 0.53 0.57  CALCIUM 9.4 8.0* 8.3*  MG 2.0  --  1.9   PHOS  --   --  2.3*   Liver Function Tests: Recent Labs  Lab 07/25/24 1859 07/26/24 0500  AST 99* 65*  ALT 44 28  ALKPHOS 146* 114  BILITOT 0.9 1.1  PROT 7.5 5.8*  ALBUMIN 4.6 3.6   No results for input(s): LIPASE, AMYLASE in the last 168 hours. No results for input(s): AMMONIA in the last 168 hours. Coagulation Profile: No results for input(s): INR, PROTIME in the last 168 hours. CBC: Recent Labs  Lab 07/25/24 1859 07/26/24 0500  WBC 13.9* 8.9  NEUTROABS 11.1*  --   HGB 14.0 11.9*  HCT 41.7 35.4*  MCV 108.9* 106.3*  PLT 210 245   Cardiac Enzymes: No results for input(s): CKTOTAL, CKMB, CKMBINDEX, TROPONINI in the last 168 hours. BNP: Invalid input(s): POCBNP CBG: Recent Labs  Lab 07/26/24 0121 07/26/24 0659 07/26/24 0734  GLUCAP 69* 69* 130*   HbA1C: No results for input(s): HGBA1C in the last 72 hours. Urine analysis:    Component Value Date/Time   COLORURINE YELLOW 07/25/2024 2139   APPEARANCEUR HAZY (A) 07/25/2024 2139   APPEARANCEUR Cloudy (A) 05/10/2023 1402   LABSPEC 1.023 07/25/2024 2139   PHURINE 5.0 07/25/2024 2139   GLUCOSEU NEGATIVE 07/25/2024 2139   HGBUR SMALL (A) 07/25/2024 2139   BILIRUBINUR NEGATIVE 07/25/2024 2139   BILIRUBINUR 1+ (A) 05/10/2023 1402   KETONESUR 20 (A) 07/25/2024 2139   PROTEINUR 30 (A) 07/25/2024 2139   UROBILINOGEN 1.0 03/16/2015 1014   NITRITE NEGATIVE 07/25/2024 2139   LEUKOCYTESUR SMALL (A) 07/25/2024 2139   Sepsis Labs: @LABRCNTIP (procalcitonin:4,lacticidven:4) )No results found for this or any previous visit (from the past 240 hours).   Scheduled Meds:  enoxaparin  (LOVENOX ) injection  30 mg Subcutaneous Q24H   feeding supplement  237 mL Oral BID BM   folic acid   1 mg Oral Daily   linaclotide   290 mcg Oral QAC breakfast   multivitamin with minerals  1 tablet Oral Daily   pantoprazole   80 mg Oral Daily   thiamine   100 mg Oral Daily   Or   thiamine   100 mg Intravenous Daily    Continuous Infusions:  dextrose  5 % and 0.9 % NaCl 50 mL/hr at 07/26/24 0123    Procedures/Studies: CT Head Wo Contrast Result Date: 07/25/2024 CLINICAL DATA:  Seizure. EXAM: CT HEAD WITHOUT CONTRAST TECHNIQUE: Contiguous axial images were obtained from the base of the skull through the vertex without intravenous contrast. RADIATION DOSE REDUCTION: This exam was performed according to the departmental dose-optimization program which includes automated exposure control, adjustment of the mA and/or kV according to patient size and/or use of iterative reconstruction technique. COMPARISON:  September 14, 2019 FINDINGS: Brain: No evidence of acute infarction, hemorrhage, hydrocephalus, extra-axial collection or mass lesion/mass effect. Cavum septum pellucidum et vergae variant is noted. Vascular: No hyperdense vessel or unexpected calcification. Skull: Normal. Negative for fracture  or focal lesion. Sinuses/Orbits: No acute finding. Other: None. IMPRESSION: No acute intracranial pathology. Electronically Signed   By: Suzen Dials M.D.   On: 07/25/2024 20:22    Alm Schneider, DO  [facility]  If 7PM-7AM, please contact night-coverage [email or web address] Password [email or web address] 07/26/2024, 8:25 AM   LOS: 1 day   "

## 2024-07-26 NOTE — ED Notes (Signed)
 Patient transported to MRI

## 2024-07-26 NOTE — ED Notes (Signed)
 This RN updated the daughter about the pt care plan at this time.

## 2024-07-26 NOTE — ED Notes (Signed)
 Checked pt brief, dry

## 2024-07-26 NOTE — Hospital Course (Signed)
 53 year old female with a history of iron overload, low B12, tobacco abuse, GERD, constipation, internal hemorrhoids, asthma presenting with witnessed seizure by her fianc.  Apparently her fianc was getting out of the shower and saw the patient foaming at the mouth and shaking in her legs.  It lasted about 10 minutes.  She was confused thereafter.  EMS was activated.  Per EMS, the patient had another tonic-clonic episode in route to the hospital.  In the ED, the patient was initially combative and history was unobtainable initially.  On the morning of 07/26/2024 the patient is awake and alert and conversant.  She states that she had a seizure about 1 to 2 months ago.  She drinks wine on a daily basis, about 1 to 2 glasses.  She states that she has been doing this for about 5 years.  She states she has not had any periods of abstinence, and she was still drinking at the time of her last seizure.  She denies any new medications.  She denies any illicit substances.  She does smoke marijuana occasionally.  She smokes about 1/2 to 1/3 pack/day of tobacco.  Her last drink of alcohol was 07/25/2024 In the ED, the patient was afebrile hemodynamically stable with oxygen  saturation 99% on room air. WBC 13.9, hemoglobin 14.0, platelet 210. Sodium 139, potassium 4.0, bicarbonate 11, serum creatinine 0.65.  AST 99, ALT 44, alk phosphatase 146, total bilirubin 0.9.  The patient was given 2 g Keppra .  CT of the brain was negative.  EDP spoke with neurology, Dr. Vanessa who requested transfer to [facility] for MR and EEG. The patient's mental status improved and returned to baseline.  The case was again discussed with neurology.  They felt the patient did not need to be transferred.  She was admitted to the medical floor at [facility].  She was continued on Keppra .  She remained clinically stable without any seizures.  Her mental status remained stable and she was conversant and follow commands. She was seen by neurology once  again, Dr. Shelton on 07/27/2024.  The patient was cleared for discharge home with Keppra  and outpatient follow-up for EEG and neuro GI evaluation.

## 2024-07-26 NOTE — Progress Notes (Addendum)
 Initial Nutrition Assessment  DOCUMENTATION CODES:   Underweight  INTERVENTION:   -Liberalize diet to regular for widest variety of meal selections -Ensure Plus High Protein po TID, each supplement provides 350 kcal and 20 grams of protein  -Magic cup TID with meals, each supplement provides 290 kcal and 9 grams of protein  -Continue MVI with minerals daily -Continue 1 mg folic acid  daily -Continue 100 mg thiamine  daily  -RD provided High Calorie, High Protein Nutrition Therapy handout from [facility] Nutrition Care Manual; attached to AVS/ discharge summary   NUTRITION DIAGNOSIS:   Increased nutrient needs related to acute illness as evidenced by estimated needs.  GOAL:   Patient will meet greater than or equal to 90% of their needs  MONITOR:   PO intake, Supplement acceptance  REASON FOR ASSESSMENT:   Consult Assessment of nutrition requirement/status  ASSESSMENT:   53 year old female with a history of iron overload, low B12, tobacco abuse, GERD, constipation, internal hemorrhoids, asthma presenting with witnessed seizure by her fianc.  Patient admitted with seizure disorder.   Patient unavailable at time of visit. Attempted to speak with patient via call to hospital room phone, however, unable to reach. RD unable to obtain further nutrition-related history or complete nutrition-focused physical exam at this time.    Patient currently on a heart healthy diet. No meal completion data available to assess at this time.   Noted patient had a hypoglycemic events this morning (blood sugar 69). Two juices were administered by RN.   Reviewed weight history. Weights have ranged from 38.2-39.5 kg over the past 8 months.   Highly suspect malnutrition secondary to underweight status, however, unable to identify at this time. Patient would greatly benefit from addition of oral nutrition supplements.   Medications reviewed and include lovenox , folic acid , MVI, protonix , and  thiamine .   Labs reviewed: K: 3.4 (on PO supplementation), Phos: 2.3, Mg WDL, CBGS: 69-130 (inpatient orders for glycemic control are none).  UDS positive for benzodiazepines and THC.   Diet Order:   Diet Order             Diet Heart Room service appropriate? Yes; Fluid consistency: Thin  Diet effective now                   EDUCATION NEEDS:   Education needs have been addressed  Skin:  Skin Assessment: Reviewed RN Assessment  Last BM:  Unknown  Height:   Ht Readings from Last 1 Encounters:  07/25/24 5' 4 (1.626 m)    Weight:   Wt Readings from Last 1 Encounters:  07/25/24 39.5 kg    Ideal Body Weight:  54.5 kg  BMI:  Body mass index is 14.93 kg/m.  Estimated Nutritional Needs:   Kcal:  1550-1750  Protein:  75-90 grams  Fluid:  1.5-1.7 L    Margery ORN, RD, LDN, CDCES Registered Dietitian III Certified Diabetes Care and Education Specialist If unable to reach this RD, please use RD Inpatient group chat on secure chat between hours of 8am-4 pm daily

## 2024-07-26 NOTE — ED Notes (Signed)
 Transported to MRI

## 2024-07-26 NOTE — ED Notes (Signed)
 This RN noted the pts BGL was 69 and gave the pt two juice containers.

## 2024-07-26 NOTE — ED Notes (Signed)
"  Pt returned from MRI.  "

## 2024-07-26 NOTE — TOC CM/SW Note (Signed)
 Transition of Care [facility]) - Inpatient Brief Assessment   Patient Details  Name: Marissa Frank MRN: [id number] Date of Birth: 08/21/1970  Transition of Care [facility]) CM/SW Contact:    Lucie Lunger, LCSWA Phone Number: 07/26/2024, 9:21 AM   Clinical Narrative: Transition of Care Department [facility]) has reviewed patient and no TOC needs have been identified at this time. We will continue to monitor patient advancement through interdiciplinary progression rounds. If new patient transition needs arise, please place a TOC consult.  Transition of Care Asessment: Insurance and Status: Insurance coverage has been reviewed Patient has primary care physician: Yes Home environment has been reviewed: From home Prior level of function:: Independent Prior/Current Home Services: No current home services Social Drivers of Health Review: SDOH reviewed no interventions necessary Readmission risk has been reviewed: Yes Transition of care needs: no transition of care needs at this time

## 2024-07-26 NOTE — Plan of Care (Signed)

## 2024-07-26 NOTE — ED Notes (Signed)
 Pt is alert to person, place, time, not event.

## 2024-07-27 DIAGNOSIS — E876 Hypokalemia: Secondary | ICD-10-CM

## 2024-07-27 DIAGNOSIS — R531 Weakness: Secondary | ICD-10-CM | POA: Diagnosis not present

## 2024-07-27 DIAGNOSIS — R569 Unspecified convulsions: Secondary | ICD-10-CM | POA: Diagnosis not present

## 2024-07-27 DIAGNOSIS — F1010 Alcohol abuse, uncomplicated: Secondary | ICD-10-CM | POA: Diagnosis not present

## 2024-07-27 LAB — BASIC METABOLIC PANEL WITH GFR
Anion gap: 9 (ref 5–15)
BUN: 5 mg/dL — ABNORMAL LOW (ref 6–20)
CO2: 24 mmol/L (ref 22–32)
Calcium: 8.7 mg/dL — ABNORMAL LOW (ref 8.9–10.3)
Chloride: 105 mmol/L (ref 98–111)
Creatinine, Ser: 0.53 mg/dL (ref 0.44–1.00)
GFR, Estimated: >60 mL/min
Glucose, Bld: 85 mg/dL (ref 70–99)
Potassium: 3.7 mmol/L (ref 3.5–5.1)
Sodium: 138 mmol/L (ref 135–145)

## 2024-07-27 LAB — CK: Total CK: 319 U/L — ABNORMAL HIGH (ref 38–234)

## 2024-07-27 LAB — MAGNESIUM: Magnesium: 1.7 mg/dL (ref 1.7–2.4)

## 2024-07-27 MED ORDER — LEVETIRACETAM 500 MG PO TABS: 500.0000 mg | ORAL_TABLET | Freq: Two times a day (BID) | ORAL | Status: DC

## 2024-07-27 MED ORDER — MAGIC MOUTHWASH W/LIDOCAINE
5.0000 mL | Freq: Four times a day (QID) | ORAL | Status: DC | PRN
  Filled 2024-07-27: qty 5

## 2024-07-27 MED ORDER — LEVETIRACETAM 500 MG PO TABS: 500.0000 mg | ORAL_TABLET | Freq: Two times a day (BID) | ORAL | 2 refills | Status: AC

## 2024-07-27 MED ORDER — LIDOCAINE VISCOUS HCL 2 % MT SOLN: 15.0000 mL | OROMUCOSAL | 0 refills | Status: AC | PRN

## 2024-07-27 NOTE — Consult Note (Addendum)
 I connected with  Marissa Frank on 07/27/2024 by a video enabled telemedicine application and verified that I am speaking with the correct person using two identifiers.   I discussed the limitations of evaluation and management by telemedicine. The patient expressed understanding and agreed to proceed.  Location of [facility] Location of physician: [facility]  Neurology Consultation Reason for Consult: seizure Referring Physician: DR Alm Tat  CC: seizure  History is obtained from: patient, chart review  HPI: Marissa Frank is a 53 y.o. female with h/o alcohol use disorder, nicotine  use disorder and cannabis use disorder who was brought in after seizure like episode. Patient doesn't remember the episode but per chart review her fiance noticed patient was seizing when he got out of shower. He called EMS. Enroute to hospital she had another seizure. Patient states she has been having episodes of whole body stiffening and jerking since this summer, lasting few minutes, associated with tongue bit and bladder incontinence at times. Can have few of these seizure like episodes per month. Has not see a physician. No clear provoking factors.   Reports drinking 2 glasses of wine daily,  has not stopped drinking recently. Reports generalized weakness, no other concerns now  Epilepsy risk factors: denies febrile seizure, head injury, meningitis, encephalitis, reportedly her brother has sz too.    ROS: All other systems reviewed and negative except as noted in the HPI.   Past Medical History:  Diagnosis Date   Allergy    Asthma    Cervical cancer (HCC)    Depression    Hemorrhoids     Family History  Problem Relation Age of Onset   Hypertension Mother    Stroke Mother    Colon cancer Neg Hx    Colon polyps Neg Hx    Stomach cancer Neg Hx    Liver disease Neg Hx     Social History:  reports that she has been smoking cigarettes. She started smoking about 34  years ago. She has a 8.5 pack-year smoking history. She has never used smokeless tobacco. She reports current alcohol use of about 7.0 standard drinks of alcohol per week. She reports that she does not currently use drugs after having used the following drugs: Marijuana.   Medications Prior to Admission  Medication Sig Dispense Refill Last Dose/Taking   albuterol  (PROVENTIL ) (2.5 MG/3ML) 0.083% nebulizer solution Take 3 mLs (2.5 mg total) by nebulization every 6 (six) hours as needed for wheezing or shortness of breath. 75 mL 6 Unknown   albuterol  (VENTOLIN  HFA) 108 (90 Base) MCG/ACT inhaler Inhale 1-2 puffs into the lungs every 6 (six) hours as needed for wheezing or shortness of breath. 18 g 2 Unknown   cycloSPORINE  (RESTASIS ) 0.05 % ophthalmic emulsion Place 1 drop into both eyes 2 (two) times daily. 30 each 11 Past Week   linaclotide  (LINZESS ) 290 MCG CAPS capsule Take 1 capsule (290 mcg total) by mouth daily before breakfast. 90 capsule 3 07/25/2024   Cholecalciferol (VITAMIN D3) 50 MCG (2000 UT) capsule Take 1 capsule (2,000 Units total) by mouth daily. (Patient not taking: Reported on 07/26/2024) 90 capsule 2 Not Taking   DULoxetine  (CYMBALTA ) 30 MG capsule Take 1 capsule (30 mg total) by mouth daily. (Patient not taking: Reported on 07/26/2024) 30 capsule 3 Not Taking   gabapentin  (NEURONTIN ) 300 MG capsule Take 1 capsule (300 mg total) by mouth 3 (three) times daily. (Patient not taking: Reported on 07/26/2024) 90 capsule 3 Not Taking  omeprazole  (PRILOSEC) 40 MG capsule Take 1 capsule (40 mg total) by mouth daily. 30 minutes before breakfast (Patient not taking: Reported on 07/26/2024) 90 capsule 3 Not Taking   solifenacin  (VESICARE ) 5 MG tablet Take 1 tablet (5 mg total) by mouth daily. (Patient not taking: Reported on 07/26/2024) 30 tablet 3 Not Taking   Vitamin D , Ergocalciferol , (DRISDOL ) 1.25 MG (50000 UNIT) CAPS capsule Take 1 capsule (50,000 Units total) by mouth every 7 (seven) days.  (Patient not taking: Reported on 07/26/2024) 5 capsule 5 Not Taking      Exam: Current vital signs: BP 128/70   Pulse 60   Temp 98.7 F (37.1 C) (Oral)   Resp 15   Ht 5' 4 (1.626 m)   Wt 39.5 kg   LMP 06/29/2018   SpO2 96%   BMI 14.93 kg/m  Vital signs in last 24 hours: Temp:  [98.2 F (36.8 C)-98.8 F (37.1 C)] 98.7 F (37.1 C) (12/29 0352) Pulse Rate:  [60-70] 60 (12/29 0518) Resp:  [15-20] 15 (12/29 0352) BP: (116-128)/(54-79) 128/70 (12/29 0518) SpO2:  [93 %-100 %] 96 % (12/29 0418)   Physical Exam  Constitutional: Appears well-developed and well-nourished.  Psych: Affect appropriate to situation Neuro: AOX3, no aphasia, CN grossly intact, antigravity strength in all extremities, FTN intact, sensory intact to light touch  I have reviewed labs in epic and the results pertinent to this consultation are: CBC:  Recent Labs  Lab 07/25/24 1859 07/26/24 0500  WBC 13.9* 8.9  NEUTROABS 11.1*  --   HGB 14.0 11.9*  HCT 41.7 35.4*  MCV 108.9* 106.3*  PLT 210 245    Basic Metabolic Panel:  Lab Results  Component Value Date   NA 138 07/27/2024   K 3.7 07/27/2024   CO2 24 07/27/2024   GLUCOSE 85 07/27/2024   BUN 5 (L) 07/27/2024   CREATININE 0.53 07/27/2024   CALCIUM 8.7 (L) 07/27/2024   GFRNONAA >60 07/27/2024   GFRAA >60 09/14/2019   Lipid Panel:  Lab Results  Component Value Date   LDLCALC 80 11/12/2023   HgbA1c:  Lab Results  Component Value Date   HGBA1C 4.9 05/08/2024   Urine Drug Screen:     Component Value Date/Time   LABOPIA NEGATIVE 07/25/2024 2139   COCAINSCRNUR NEGATIVE 07/25/2024 2139   LABBENZ POSITIVE (A) 07/25/2024 2139   AMPHETMU NEGATIVE 07/25/2024 2139   THCU POSITIVE (A) 07/25/2024 2139   LABBARB NEGATIVE 07/25/2024 2139    Alcohol Level     Component Value Date/Time   [facility] <15 07/25/2024 1859     I have reviewed the images obtained:  CT Head without contrast 07/25/2024: No acute intracranial pathology.  MRI Brain w  and wo contrast 07/26/2024: Mildly motion degraded but stable and normal for age MRI appearance of the brain.  ASSESSMENT/PLAN: 53yo F with seizure like episodes, no clear provoking factors.  Seizure - Due to no clear provoking factors and multiple episodes, for now would recommend keppra  500mg  BID - Routine eeg to assess for epileptogenicity ( can be done as an outpatient) - Discussed seizure provoking factors including alcohol use, THC use, sleep deprivation, missing meds, infection - Counseled against alcohol, nicotine  and cannabis use - counseled about seizure precautions including no driving for 6 months until cleared by physician - f/u with neuro in 3-6 weeks - Discussed plan with Dr Tat via secure chat  Generalized weakness - will check CK - PT/OT  Seizure precautions: Per   [place]V statutes, patients with seizures  are not allowed to drive until they have been seizure-free for six months and cleared by a physician    Use caution when using heavy equipment or power tools. Avoid working on ladders or at heights. Take showers instead of baths. Ensure the water temperature is not too high on the home water heater. Do not go swimming alone. Do not lock yourself in a room alone (i.e. bathroom). When caring for infants or small children, sit down when holding, feeding, or changing them to minimize risk of injury to the child in the event you have a seizure. Maintain good sleep hygiene. Avoid alcohol.    If patient has another seizure, call 911 and bring them back to the ED if: A.  The seizure lasts longer than 5 minutes.      B.  The patient doesn't wake shortly after the seizure or has new problems such as difficulty seeing, speaking or moving following the seizure C.  The patient was injured during the seizure D.  The patient has a temperature over 102 F (39C) E.  The patient vomited during the seizure and now is having trouble breathing    During the Seizure   - First,  ensure adequate ventilation and place patients on the floor on their left side  Loosen clothing around the neck and ensure the airway is patent. If the patient is clenching the teeth, do not force the mouth open with any object as this can cause severe damage - Remove all items from the surrounding that can be hazardous. The patient may be oblivious to what's happening and may not even know what he or she is doing. If the patient is confused and wandering, either gently guide him/her away and block access to outside areas - Reassure the individual and be comforting - Call 911. In most cases, the seizure ends before EMS arrives. However, there are cases when seizures may last over 3 to 5 minutes. Or the individual may have developed breathing difficulties or severe injuries. If a pregnant patient or a person with diabetes develops a seizure, it is prudent to call an ambulance.     After the Seizure (Postictal Stage)   After a seizure, most patients experience confusion, fatigue, muscle pain and/or a headache. Thus, one should permit the individual to sleep. For the next few days, reassurance is essential. Being calm and helping reorient the person is also of importance.   Most seizures are painless and end spontaneously. Seizures are not harmful to others but can lead to complications such as stress on the lungs, brain and the heart. Individuals with prior lung problems may develop labored breathing and respiratory distress.        Thank you for allowing us  to participate in the care of this patient. If you have any further questions, please contact  me or neurohospitalist.     I personally spent a total of 85 minutes in the care of the patient today including getting/reviewing separately obtained history, performing a medically appropriate exam/evaluation, counseling and educating, placing orders, referring and communicating with other health care professionals, documenting clinical information  in the EHR, independently interpreting results, and coordinating care.         Arlin Krebs Epile[facility]

## 2024-07-27 NOTE — Care Management Important Message (Signed)
 Important Message  Patient Details  Name: Marissa Frank MRN: [id number] Date of Birth: [birth year]   Important Message Given:  N/A - LOS <3 / Initial given by admissions     Duwaine LITTIE Ada 07/27/2024, 10:15 AM

## 2024-07-27 NOTE — Plan of Care (Signed)
  Problem: Education: Goal: Knowledge of General Education information will improve Description: Including pain rating scale, medication(s)/side effects and non-pharmacologic comfort measures Outcome: Adequate for Discharge   Problem: Health Behavior/Discharge Planning: Goal: Ability to manage health-related needs will improve Outcome: Adequate for Discharge   Problem: Clinical Measurements: Goal: Ability to maintain clinical measurements within normal limits will improve Outcome: Adequate for Discharge Goal: Will remain free from infection Outcome: Adequate for Discharge Goal: Diagnostic test results will improve Outcome: Adequate for Discharge Goal: Respiratory complications will improve Outcome: Adequate for Discharge Goal: Cardiovascular complication will be avoided Outcome: Adequate for Discharge   Problem: Activity: Goal: Risk for activity intolerance will decrease Outcome: Adequate for Discharge   Problem: Nutrition: Goal: Adequate nutrition will be maintained Outcome: Adequate for Discharge   Problem: Coping: Goal: Level of anxiety will decrease Outcome: Adequate for Discharge   Problem: Elimination: Goal: Will not experience complications related to bowel motility Outcome: Adequate for Discharge Goal: Will not experience complications related to urinary retention Outcome: Adequate for Discharge   Problem: Pain Managment: Goal: General experience of comfort will improve and/or be controlled Outcome: Adequate for Discharge   Problem: Safety: Goal: Ability to remain free from injury will improve Outcome: Adequate for Discharge   Problem: Skin Integrity: Goal: Risk for impaired skin integrity will decrease Outcome: Adequate for Discharge   Problem: Increased Nutrient Needs (NI-5.1) Goal: Food and/or nutrient delivery Description: Individualized approach for food/nutrient provision. Outcome: Adequate for Discharge

## 2024-07-27 NOTE — Discharge Summary (Signed)
 " Physician Discharge Summary   Patient: Marissa Frank MRN: [id number] DOB: 03-01-1971  Admit date:     07/25/2024  Discharge date: 07/27/2024  Discharge Physician: Alm Unique Sillas   PCP: Terry Wilhelmena Lloyd Hilario, FNP   Recommendations at discharge:   Please follow up with primary care provider within 1-2 weeks  Please repeat BMP and CBC in one week     Hospital Course: 53 year old female with a history of iron overload, low B12, tobacco abuse, GERD, constipation, internal hemorrhoids, asthma presenting with witnessed seizure by her fianc.  Apparently her fianc was getting out of the shower and saw the patient foaming at the mouth and shaking in her legs.  It lasted about 10 minutes.  She was confused thereafter.  EMS was activated.  Per EMS, the patient had another tonic-clonic episode in route to the hospital.  In the ED, the patient was initially combative and history was unobtainable initially.  On the morning of 07/26/2024 the patient is awake and alert and conversant.  She states that she had a seizure about 1 to 2 months ago.  She drinks wine on a daily basis, about 1 to 2 glasses.  She states that she has been doing this for about 5 years.  She states she has not had any periods of abstinence, and she was still drinking at the time of her last seizure.  She denies any new medications.  She denies any illicit substances.  She does smoke marijuana occasionally.  She smokes about 1/2 to 1/3 pack/day of tobacco.  Her last drink of alcohol was 07/25/2024 In the ED, the patient was afebrile hemodynamically stable with oxygen  saturation 99% on room air. WBC 13.9, hemoglobin 14.0, platelet 210. Sodium 139, potassium 4.0, bicarbonate 11, serum creatinine 0.65.  AST 99, ALT 44, alk phosphatase 146, total bilirubin 0.9.  The patient was given 2 g Keppra .  CT of the brain was negative.  EDP spoke with neurology, Dr. Vanessa who requested transfer to [facility] for MR and EEG. The patient's mental  status improved and returned to baseline.  The case was again discussed with neurology.  They felt the patient did not need to be transferred.  She was admitted to the medical floor at [facility].  She was continued on Keppra .  She remained clinically stable without any seizures.  Her mental status remained stable and she was conversant and follow commands. She was seen by neurology once again, Dr. Shelton on 07/27/2024.  The patient was cleared for discharge home with Keppra  and outpatient follow-up for EEG and neuro GI evaluation.  Assessment and Plan:     Seizure disorder - Continued IV Keppra  - no further seizures during the admission - Mental status returned to baseline - EEG--can be done as outpt per neurology - MRI brain--negative for acute findings - UDS positive for benzodiazepines and THC - UA negative for pyuria - CT brain negative for acute findings -Appreciate neurology consult, Dr. Arlington to d/c with Keppra  and outpt neurology follow up - viscous lidocaine  prn swish and spit for tongue injury from seizure   Metabolic alkalosis/alcohol ketosis - Presented with bicarbonate 11, anion gap 30. - Improved with IV fluids   Hypokalemia - Replete -check mag 1.9   Alcohol abuse - Alcohol withdrawal protocol   Leukemoid reaction - Secondary to stress demargination - Improved - She remains afebrile hemodynamically stable   Hypoglycemia -due to poor po intake - 05/08/2024 hemoglobin A1c 4.9 - improved with po intake  Consultants: neurology Procedures performed: none  Disposition: Home Diet recommendation:  soft DISCHARGE MEDICATION: Allergies as of 07/27/2024       Reactions   Almond Oil Itching   Carrot [daucus [place]] Itching   Celery Oil Itching   Coconut Flavoring Agent (non-screening) Nausea And Vomiting, Other (See Comments)   Itchy throat   Macadamia Nut Oil Itching        Medication List     STOP taking these medications    DULoxetine  30  MG capsule Commonly known as: Cymbalta    gabapentin  300 MG capsule Commonly known as: NEURONTIN    omeprazole  40 MG capsule Commonly known as: PRILOSEC   solifenacin  5 MG tablet Commonly known as: VESICARE    Vitamin D  (Ergocalciferol ) 1.25 MG (50000 UNIT) Caps capsule Commonly known as: DRISDOL    Vitamin D3 50 MCG (2000 UT) capsule       TAKE these medications    albuterol  (2.5 MG/3ML) 0.083% nebulizer solution Commonly known as: PROVENTIL  Take 3 mLs (2.5 mg total) by nebulization every 6 (six) hours as needed for wheezing or shortness of breath.   albuterol  108 (90 Base) MCG/ACT inhaler Commonly known as: VENTOLIN  HFA Inhale 1-2 puffs into the lungs every 6 (six) hours as needed for wheezing or shortness of breath.   cycloSPORINE  0.05 % ophthalmic emulsion Commonly known as: Restasis  Place 1 drop into both eyes 2 (two) times daily.   levETIRAcetam  500 MG tablet Commonly known as: KEPPRA  Take 1 tablet (500 mg total) by mouth 2 (two) times daily.   lidocaine  2 % solution Commonly known as: XYLOCAINE  Use as directed 15 mLs in the mouth or throat as needed for mouth pain.   linaclotide  290 MCG Caps capsule Commonly known as: Linzess  Take 1 capsule (290 mcg total) by mouth daily before breakfast.        Discharge Exam: Filed Weights   07/25/24 2200  Weight: 39.5 kg   HEENT:  [place]/AT, No thrush, no icterus CV:  RRR, no rub, no S3, no S4 Lung:  CTA, no wheeze, no rhonchi Abd:  soft/+BS, NT Ext:  No edema, no lymphangitis, no synovitis, no rash Neuro:  CN II-XII intact, strength 4/5 in RUE, RLE, strength 4/5 LUE, LLE; sensation intact bilateral; no dysmetria; babinski equivocal   Condition at discharge: stable  The results of significant diagnostics from this hospitalization (including imaging, microbiology, ancillary and laboratory) are listed below for reference.   Imaging Studies: MR Brain W and Wo Contrast Result Date: 07/26/2024 EXAM: MRI BRAIN WITH  AND WITHOUT CONTRAST 07/26/2024 08:33:00 AM TECHNIQUE: Multiplanar multisequence MRI of the head/brain was performed with and without the administration of intravenous contrast. COMPARISON: Brain MRI 08/04/2018, head CT 07/25/2024. CLINICAL HISTORY: 53 year old female with new onset seizure. FINDINGS: Intermittent motion artifact despite repeated imaging attempts. BRAIN AND VENTRICLES: Brain volume not significantly changed since 2020. On axial images gray and white matter signal appears stable and normal for age. No chronic cerebral blood products identified on SWI. On thin slice coronal images hippocampal formations and mesial temporal lobes appear symmetric and within normal limits. Cavum septum pellucidum, normal variant. No acute infarct. No acute intracranial hemorrhage. No mass effect or midline shift. No hydrocephalus. The sella is unremarkable. Normal flow voids. Major dural venous sinuses are enhancing and appear to be patent. No mass. No abnormal intracranial enhancement or dural thickening identified. ORBITS: No acute abnormality. SINUSES: No acute abnormality. BONES AND SOFT TISSUES: Normal bone marrow signal and enhancement. No acute soft tissue abnormality.  Negative visible cervical spine. IMPRESSION: 1. Mildly motion degraded but stable and normal for age MRI appearance of the brain. Electronically signed by: Helayne Hurst MD 07/26/2024 08:59 AM EST RP Workstation: [id number]   CT Head Wo Contrast Result Date: 07/25/2024 CLINICAL DATA:  Seizure. EXAM: CT HEAD WITHOUT CONTRAST TECHNIQUE: Contiguous axial images were obtained from the base of the skull through the vertex without intravenous contrast. RADIATION DOSE REDUCTION: This exam was performed according to the departmental dose-optimization program which includes automated exposure control, adjustment of the mA and/or kV according to patient size and/or use of iterative reconstruction technique. COMPARISON:  September 14, 2019 FINDINGS: Brain:  No evidence of acute infarction, hemorrhage, hydrocephalus, extra-axial collection or mass lesion/mass effect. Cavum septum pellucidum et vergae variant is noted. Vascular: No hyperdense vessel or unexpected calcification. Skull: Normal. Negative for fracture or focal lesion. Sinuses/Orbits: No acute finding. Other: None. IMPRESSION: No acute intracranial pathology. Electronically Signed   By: Suzen Dials M.D.   On: 07/25/2024 20:22    Microbiology: Results for orders placed or performed during the hospital encounter of 11/23/23  Wet prep, genital     Status: Abnormal   Collection Time: 11/23/23 10:43 PM   Specimen: Vaginal  Result Value Ref Range Status   Yeast Wet Prep HPF POC NONE SEEN NONE SEEN Final   Trich, Wet Prep NONE SEEN NONE SEEN Final   Clue Cells Wet Prep HPF POC NONE SEEN NONE SEEN Final   WBC, Wet Prep HPF POC <10 (A) <10 Final   Sperm NONE SEEN  Final    Comment: Performed at [facility], [address] [place], [place] [address]    Labs: CBC: Recent Labs  Lab 07/25/24 1859 07/26/24 0500  WBC 13.9* 8.9  NEUTROABS 11.1*  --   HGB 14.0 11.9*  HCT 41.7 35.4*  MCV 108.9* 106.3*  PLT 210 245   Basic Metabolic Panel: Recent Labs  Lab 07/25/24 1859 07/25/24 2250 07/26/24 0500 07/27/24 0532  NA 139 138 140 138  K 4.0 4.1 3.4* 3.7  CL 98 105 105 105  CO2 11* 20* 21* 24  GLUCOSE 122* 69* 69* 85  BUN 8 8 7  5*  CREATININE 0.65 0.53 0.57 0.53  CALCIUM 9.4 8.0* 8.3* 8.7*  MG 2.0  --  1.9 1.7  PHOS  --   --  2.3*  --    Liver Function Tests: Recent Labs  Lab 07/25/24 1859 07/26/24 0500  AST 99* 65*  ALT 44 28  ALKPHOS 146* 114  BILITOT 0.9 1.1  PROT 7.5 5.8*  ALBUMIN 4.6 3.6   CBG: Recent Labs  Lab 07/26/24 0121 07/26/24 0659 07/26/24 0734  GLUCAP 69* 69* 130*    Discharge time spent: greater than 30 minutes.  Signed: Alm Schneider, MD [facility] 07/27/2024 "

## 2024-07-28 ENCOUNTER — Telehealth: Payer: Self-pay

## 2024-07-28 NOTE — Transitions of Care (Post Inpatient/ED Visit) (Signed)
" ° °  07/28/2024  Name: Marissa Frank MRN: [id number] DOB: 03/29/1971  Today's TOC FU Call Status: Today's TOC FU Call Status:: Successful TOC FU Call Completed TOC FU Call Complete Date: 07/28/24  Patient's Name and Date of Birth confirmed. Name, DOB  Transition Care Management Follow-up Telephone Call Date of Discharge: 07/27/24 Dis[facility] Type of Discharge: Inpatient Admission Primary Inpatient Discharge Diagnosis:: convulsions How have you been since you were released from the hospital?: Better Any questions or concerns?: No  Items Reviewed: Did you receive and understand the discharge instructions provided?: Yes Medications obtained,verified, and reconciled?: Yes (Medications Reviewed) Any new allergies since your discharge?: No Dietary orders reviewed?: Yes Do you have support at home?: No  Medications Reviewed Today: Medications Reviewed Today     Reviewed by Emmitt Pan, LPN (Licensed Practical Nurse) on 07/28/24 at 1122  Med List Status: <None>   Medication Order Taking? Sig Documenting Provider Last Dose Status Informant  albuterol  (PROVENTIL ) (2.5 MG/3ML) 0.083% nebulizer solution [id number] Yes Take 3 mLs (2.5 mg total) by nebulization every 6 (six) hours as needed for wheezing or shortness of breath. Chandra Harlene LABOR, NP  Active Self, Pharmacy Records  albuterol  (VENTOLIN  HFA) [phone] Base) MCG/ACT inhaler [id number] Yes Inhale 1-2 puffs into the lungs every 6 (six) hours as needed for wheezing or shortness of breath. Bevely Doffing, FNP  Active Self, Pharmacy Records  cycloSPORINE  (RESTASIS ) 0.05 % ophthalmic emulsion [id number] Yes Place 1 drop into both eyes 2 (two) times daily. Bevely Doffing, FNP  Active Self, Pharmacy Records  levETIRAcetam  (KEPPRA ) 500 MG tablet [id number] Yes Take 1 tablet (500 mg total) by mouth 2 (two) times daily. Evonnie Lenis, MD  Active   lidocaine  (XYLOCAINE ) 2 % solution [id number] Yes Use as directed 15  mLs in the mouth or throat as needed for mouth pain. Evonnie Lenis, MD  Active   linaclotide  (LINZESS ) 290 MCG CAPS capsule [id number] Yes Take 1 capsule (290 mcg total) by mouth daily before breakfast. Del Wilhelmena Falter, Hilario, FNP  Active Self, Pharmacy Records            Home Care and Equipment/Supplies: Were Home Health Services Ordered?: NA Any new equipment or medical supplies ordered?: NA  Functional Questionnaire: Do you need assistance with bathing/showering or dressing?: No Do you need assistance with meal preparation?: No Do you need assistance with eating?: No Do you have difficulty maintaining continence: No Do you need assistance with getting out of bed/getting out of a chair/moving?: No Do you have difficulty managing or taking your medications?: No  Follow up appointments reviewed: PCP Follow-up appointment confirmed?: Yes Date of PCP follow-up appointment?: 08/05/24 Follow-up Provider: [facility] Follow-up appointment confirmed?: No Reason Specialist Follow-Up Not Confirmed: Patient has Specialist Provider Number and will Call for Appointment Do you need transportation to your follow-up appointment?: No Do you understand care options if your condition(s) worsen?: Yes-patient verbalized understanding    SIGNATURE Pan Emmitt, LPN [facility] Nurse Health Advisor Direct Dial [phone]  "

## 2024-07-29 ENCOUNTER — Inpatient Hospital Stay (HOSPITAL_COMMUNITY): Admission: RE | Admit: 2024-07-29

## 2024-07-29 LAB — VITAMIN B1: Vitamin B1 (Thiamine): 172.8 nmol/L (ref 66.5–200.0)

## 2024-08-05 ENCOUNTER — Encounter: Payer: Self-pay | Admitting: Family Medicine

## 2024-08-05 ENCOUNTER — Ambulatory Visit: Admitting: Family Medicine

## 2024-08-05 VITALS — BP 120/73 | HR 56 | Resp 16 | Ht 64.0 in | Wt 91.0 lb

## 2024-08-05 DIAGNOSIS — Z720 Tobacco use: Secondary | ICD-10-CM

## 2024-08-05 DIAGNOSIS — F411 Generalized anxiety disorder: Secondary | ICD-10-CM | POA: Diagnosis not present

## 2024-08-05 DIAGNOSIS — R569 Unspecified convulsions: Secondary | ICD-10-CM

## 2024-08-05 DIAGNOSIS — R222 Localized swelling, mass and lump, trunk: Secondary | ICD-10-CM | POA: Diagnosis not present

## 2024-08-05 DIAGNOSIS — D509 Iron deficiency anemia, unspecified: Secondary | ICD-10-CM | POA: Diagnosis not present

## 2024-08-05 DIAGNOSIS — Z7689 Persons encountering health services in other specified circumstances: Secondary | ICD-10-CM | POA: Diagnosis not present

## 2024-08-05 DIAGNOSIS — F322 Major depressive disorder, single episode, severe without psychotic features: Secondary | ICD-10-CM | POA: Diagnosis not present

## 2024-08-05 DIAGNOSIS — F1010 Alcohol abuse, uncomplicated: Secondary | ICD-10-CM

## 2024-08-05 DIAGNOSIS — E876 Hypokalemia: Secondary | ICD-10-CM | POA: Diagnosis not present

## 2024-08-05 NOTE — Patient Instructions (Addendum)
 F/U with Leita as before  You are being referred to Psychiatry in this building  Pls schedule mammogram at checkout already ordered x 2 and past due  CBc, cmp and egfr today  Need covid vaccine at your phaarmacy ,   PLEASE Kepra is twice daily  No Asprin needed  Thanks for choosing [facility], we consider it a privelige to serve you.

## 2024-08-06 ENCOUNTER — Ambulatory Visit: Payer: Self-pay | Admitting: Family Medicine

## 2024-08-06 LAB — CBC WITH DIFFERENTIAL/PLATELET
Basophils Absolute: 0.1 x10E3/uL (ref 0.0–0.2)
Basos: 1 %
EOS (ABSOLUTE): 0.1 x10E3/uL (ref 0.0–0.4)
Eos: 1 %
Hematocrit: 38.4 % (ref 34.0–46.6)
Hemoglobin: 13.2 g/dL (ref 11.1–15.9)
Immature Grans (Abs): 0 x10E3/uL (ref 0.0–0.1)
Immature Granulocytes: 0 %
Lymphocytes Absolute: 1.1 x10E3/uL (ref 0.7–3.1)
Lymphs: 23 %
MCH: 35.7 pg — ABNORMAL HIGH (ref 26.6–33.0)
MCHC: 34.4 g/dL (ref 31.5–35.7)
MCV: 104 fL — ABNORMAL HIGH (ref 79–97)
Monocytes Absolute: 1.1 x10E3/uL — ABNORMAL HIGH (ref 0.1–0.9)
Monocytes: 23 %
Neutrophils Absolute: 2.5 x10E3/uL (ref 1.4–7.0)
Neutrophils: 51 %
Platelets: 417 x10E3/uL (ref 150–450)
RBC: 3.7 x10E6/uL — ABNORMAL LOW (ref 3.77–5.28)
RDW: 12.1 % (ref 11.7–15.4)
WBC: 4.8 x10E3/uL (ref 3.4–10.8)

## 2024-08-06 LAB — CMP14+EGFR
ALT: 38 IU/L — ABNORMAL HIGH (ref 0–32)
AST: 109 IU/L — ABNORMAL HIGH (ref 0–40)
Albumin: 3.8 g/dL (ref 3.8–4.9)
Alkaline Phosphatase: 114 IU/L (ref 49–135)
BUN/Creatinine Ratio: 10 (ref 9–23)
BUN: 6 mg/dL (ref 6–24)
Bilirubin Total: 0.3 mg/dL (ref 0.0–1.2)
CO2: 23 mmol/L (ref 20–29)
Calcium: 9.2 mg/dL (ref 8.7–10.2)
Chloride: 105 mmol/L (ref 96–106)
Creatinine, Ser: 0.6 mg/dL (ref 0.57–1.00)
Globulin, Total: 2.2 g/dL (ref 1.5–4.5)
Glucose: 85 mg/dL (ref 70–99)
Potassium: 4.4 mmol/L (ref 3.5–5.2)
Sodium: 143 mmol/L (ref 134–144)
Total Protein: 6 g/dL (ref 6.0–8.5)
eGFR: 106 mL/min/1.73

## 2024-08-06 MED ORDER — CEPHALEXIN 500 MG PO CAPS: 500.0000 mg | ORAL_CAPSULE | Freq: Four times a day (QID) | ORAL | 0 refills | Status: AC

## 2024-08-07 NOTE — Progress Notes (Unsigned)
" ° °  Marissa Frank     MRN: [id number]      DOB: February 27, 1971  Chief Complaint  Patient presents with   Hospitalization Follow-up    Admitted 12/27-12/29 for seizure    Abdominal Pain    Pt complains of knot in her abdomin, noticed last night, painful to touch     HPI Marissa Frank is here for follow up of hospitalization from 12/27 to 07/27/2024, for a TOC visit Denies seizure activity since d/c, taking kepra incorrectly just once daily and still drinkls 1 glass , large  of wine DAILY STATES SHE DOES NOT NEED TO GO TO REHAB , can stop drinking on her own C/o painful lower abdominal swelling/knot, no drainage , fever or chills ROS Denies recent fever or chills. Denies sinus pressure, nasal congestion, ear pain or sore throat. Denies chest congestion, productive cough or wheezing. Denies chest pains, palpitations and leg swelling Denies abdominal pain, nausea, vomiting,diarrhea or constipation.   Denies dysuria, frequency, hesitancy or incontinence. Denies joint pain, swelling and limitation in mobility. Denies headaches, seizures, numbness, or tingling.    PE  BP 120/73   Pulse (!) 56   Resp 16   Ht 5' 4 (1.626 m)   Wt 91 lb (41.3 kg)   LMP 06/29/2018   BMI 15.62 kg/m   Patient alert and oriented and in no cardiopulmonary distress.underweight  HEENT: No facial asymmetry, EOMI,     Neck supple .  Chest: Clear to auscultation bilaterally.  CVS: S1, S2 no murmurs, no S3.Regular rate.  ABD: Soft non tender.   Ext: No edema  MS: Adequate ROM spine, shoulders, hips and knees.  Skin: cellulitis and nodule under the skin RLQ  Psych: Good eye contact, normal affect. Memory intact not anxious or depressed appearing.  CNS: CN 2-12 intact, power,  normal throughout.no focal deficits noted.   Assessment & Plan  GAD (generalized anxiety disorder) Uncontrolled anxiety and alcohol abuse refer Psychiatry, recommended that she join out pt addiction program which is  available, in denial and declined, despite recent hospitalization  Seizures (HCC) Needs to take kepra twice daily as prescribed, inadvertently taking it once daily, no seizures since d/c  Alcohol abuse Needs to abstain as having health challenges due to daily alcohol use, not motivated a this time, in denial, stating she can stop at any time  Tobacco use Asked:confirms currently smokes cigarettes Assess: Unwilling to set a quit date, not specifically  cutting back currently Advise: needs to QUIT to reduce risk of cancer, cardio and cerebrovascular disease Assist: counseled for 5 minutes and literature provided Arrange: follow up in 2 to 4 months   Encounter for support and coordination of transition of care Patient in for follow up of recent hospitalization and TOC visit Discharge summary, and laboratory and radiology data are reviewed, and any questions or concerns  are discussed. Specific issues requiring follow up are specifically addressed.   Subcutaneous nodule of abdominal wall Keflex  prescribed x 1 week, erythematous and warm, no pus produced on direct pressure  "

## 2024-08-10 ENCOUNTER — Encounter: Payer: Self-pay | Admitting: Family Medicine

## 2024-08-10 DIAGNOSIS — F322 Major depressive disorder, single episode, severe without psychotic features: Secondary | ICD-10-CM | POA: Insufficient documentation

## 2024-08-10 DIAGNOSIS — Z7689 Persons encountering health services in other specified circumstances: Secondary | ICD-10-CM | POA: Insufficient documentation

## 2024-08-10 DIAGNOSIS — R222 Localized swelling, mass and lump, trunk: Secondary | ICD-10-CM | POA: Insufficient documentation

## 2024-08-10 DIAGNOSIS — F411 Generalized anxiety disorder: Secondary | ICD-10-CM | POA: Insufficient documentation

## 2024-08-10 NOTE — Assessment & Plan Note (Signed)
Patient in for follow up of recent hospitalization.and TOC visit Discharge summary, and laboratory and radiology data are reviewed, and any questions or concerns  are discussed. Specific issues requiring follow up are specifically addressed.

## 2024-08-10 NOTE — Assessment & Plan Note (Signed)
 Keflex  prescribed x 1 week, erythematous and warm, no pus produced on direct pressure

## 2024-08-10 NOTE — Assessment & Plan Note (Signed)
 Uncontrolled anxiety and alcohol abuse refer Psychiatry, recommended that she join out pt addiction program which is available, in denial and declined, despite recent hospitalization

## 2024-08-10 NOTE — Assessment & Plan Note (Signed)
 Needs to abstain as having health challenges due to daily alcohol use, not motivated a this time, in denial, stating she can stop at any time

## 2024-08-10 NOTE — Assessment & Plan Note (Signed)
 Needs to take kepra twice daily as prescribed, inadvertently taking it once daily, no seizures since d/c

## 2024-08-10 NOTE — Assessment & Plan Note (Addendum)
 Asked:confirms currently smokes cigarettes Assess: Unwilling to set a quit date, not specifically  cutting back currently Advise: needs to QUIT to reduce risk of cancer, cardio and cerebrovascular disease Assist: counseled for 5 minutes and literature provided Arrange: follow up in 2 to 4 months

## 2024-08-12 ENCOUNTER — Encounter (HOSPITAL_COMMUNITY): Payer: Self-pay

## 2024-08-12 ENCOUNTER — Ambulatory Visit (HOSPITAL_COMMUNITY)
Admission: RE | Admit: 2024-08-12 | Discharge: 2024-08-12 | Disposition: A | Discharge status: Home / Self Care | Source: Ambulatory Visit | Attending: Internal Medicine | Admitting: Internal Medicine

## 2024-08-12 DIAGNOSIS — Z1231 Encounter for screening mammogram for malignant neoplasm of breast: Secondary | ICD-10-CM | POA: Diagnosis present

## 2024-09-03 ENCOUNTER — Telehealth: Payer: Self-pay | Admitting: Family Medicine

## 2024-09-03 NOTE — Telephone Encounter (Unsigned)
 Copied from CRM [id number]. Topic: Clinical - Medical Advice >> Sep 03, 2024  1:42 PM Olam RAMAN wrote: Reason for CRM: pt wanted laura H to know, pt went to dentist since pt has siezures if she is ok if pt can get anesthesia. Pt went today and dentist told her she was not able to get procedure done and needs a cb to make sure she is ok to have dental procedure done. Rostatis for eyes, pt did not get medication when she went to office Cb [phone] Dentist number: [phone]

## 2024-09-04 NOTE — Telephone Encounter (Signed)
 Okay to proceed with anesthesia for dental procedure.

## 2024-09-04 NOTE — Telephone Encounter (Signed)
 Tried to call Dental Clinic and left voicemail to call back

## 2024-11-09 ENCOUNTER — Ambulatory Visit: Payer: Self-pay

## 2025-07-19 ENCOUNTER — Ambulatory Visit: Payer: Self-pay
